# Patient Record
Sex: Male | Born: 1945 | Race: Black or African American | Hispanic: No | Marital: Married | Smoking: Former smoker
Health system: Southern US, Community
[De-identification: ages and names within clinical notes are randomized; demographics above are authoritative.]

## PROBLEM LIST (undated history)

## (undated) DIAGNOSIS — J9600 Acute respiratory failure, unspecified whether with hypoxia or hypercapnia: Secondary | ICD-10-CM

## (undated) DIAGNOSIS — I214 Non-ST elevation (NSTEMI) myocardial infarction: Secondary | ICD-10-CM

## (undated) DIAGNOSIS — I2510 Atherosclerotic heart disease of native coronary artery without angina pectoris: Secondary | ICD-10-CM

## (undated) DIAGNOSIS — E785 Hyperlipidemia, unspecified: Secondary | ICD-10-CM

## (undated) DIAGNOSIS — J810 Acute pulmonary edema: Secondary | ICD-10-CM

## (undated) DIAGNOSIS — R000 Tachycardia, unspecified: Secondary | ICD-10-CM

## (undated) DIAGNOSIS — I10 Essential (primary) hypertension: Secondary | ICD-10-CM

## (undated) HISTORY — DX: Hyperlipidemia, unspecified: E78.5

## (undated) HISTORY — DX: Atherosclerotic heart disease of native coronary artery without angina pectoris: I25.10

---

## 1999-05-01 ENCOUNTER — Inpatient Hospital Stay (HOSPITAL_COMMUNITY): Admission: EM | Admit: 1999-05-01 | Discharge: 1999-05-03 | Payer: Self-pay | Admitting: Emergency Medicine

## 2004-07-14 ENCOUNTER — Inpatient Hospital Stay (HOSPITAL_COMMUNITY): Admission: EM | Admit: 2004-07-14 | Discharge: 2004-07-16 | Payer: Self-pay | Admitting: Emergency Medicine

## 2005-11-27 ENCOUNTER — Ambulatory Visit: Payer: Self-pay | Admitting: Family Medicine

## 2011-11-21 ENCOUNTER — Encounter (HOSPITAL_COMMUNITY): Payer: Self-pay | Admitting: Cardiology

## 2011-11-21 ENCOUNTER — Inpatient Hospital Stay (HOSPITAL_COMMUNITY)
Admission: EM | Admit: 2011-11-21 | Discharge: 2011-12-11 | DRG: 233 | Disposition: A | Discharge status: Home / Self Care | Payer: Medicare Other | Attending: Thoracic Surgery (Cardiothoracic Vascular Surgery) | Admitting: Thoracic Surgery (Cardiothoracic Vascular Surgery)

## 2011-11-21 ENCOUNTER — Emergency Department (HOSPITAL_COMMUNITY): Payer: Medicare Other

## 2011-11-21 ENCOUNTER — Other Ambulatory Visit: Payer: Self-pay

## 2011-11-21 DIAGNOSIS — I2589 Other forms of chronic ischemic heart disease: Secondary | ICD-10-CM | POA: Diagnosis present

## 2011-11-21 DIAGNOSIS — I10 Essential (primary) hypertension: Secondary | ICD-10-CM | POA: Diagnosis present

## 2011-11-21 DIAGNOSIS — I5041 Acute combined systolic (congestive) and diastolic (congestive) heart failure: Secondary | ICD-10-CM | POA: Diagnosis present

## 2011-11-21 DIAGNOSIS — N19 Unspecified kidney failure: Secondary | ICD-10-CM

## 2011-11-21 DIAGNOSIS — N179 Acute kidney failure, unspecified: Secondary | ICD-10-CM | POA: Diagnosis present

## 2011-11-21 DIAGNOSIS — N184 Chronic kidney disease, stage 4 (severe): Secondary | ICD-10-CM | POA: Diagnosis present

## 2011-11-21 DIAGNOSIS — I959 Hypotension, unspecified: Secondary | ICD-10-CM | POA: Diagnosis present

## 2011-11-21 DIAGNOSIS — E875 Hyperkalemia: Secondary | ICD-10-CM | POA: Diagnosis present

## 2011-11-21 DIAGNOSIS — E872 Acidosis, unspecified: Secondary | ICD-10-CM | POA: Diagnosis present

## 2011-11-21 DIAGNOSIS — I509 Heart failure, unspecified: Secondary | ICD-10-CM | POA: Diagnosis present

## 2011-11-21 DIAGNOSIS — D696 Thrombocytopenia, unspecified: Secondary | ICD-10-CM | POA: Diagnosis not present

## 2011-11-21 DIAGNOSIS — I452 Bifascicular block: Secondary | ICD-10-CM | POA: Diagnosis present

## 2011-11-21 DIAGNOSIS — J810 Acute pulmonary edema: Secondary | ICD-10-CM

## 2011-11-21 DIAGNOSIS — R0603 Acute respiratory distress: Secondary | ICD-10-CM

## 2011-11-21 DIAGNOSIS — Z7982 Long term (current) use of aspirin: Secondary | ICD-10-CM

## 2011-11-21 DIAGNOSIS — I447 Left bundle-branch block, unspecified: Secondary | ICD-10-CM | POA: Diagnosis present

## 2011-11-21 DIAGNOSIS — I2510 Atherosclerotic heart disease of native coronary artery without angina pectoris: Secondary | ICD-10-CM | POA: Diagnosis present

## 2011-11-21 DIAGNOSIS — I129 Hypertensive chronic kidney disease with stage 1 through stage 4 chronic kidney disease, or unspecified chronic kidney disease: Secondary | ICD-10-CM | POA: Diagnosis present

## 2011-11-21 DIAGNOSIS — I059 Rheumatic mitral valve disease, unspecified: Secondary | ICD-10-CM | POA: Diagnosis present

## 2011-11-21 DIAGNOSIS — I214 Non-ST elevation (NSTEMI) myocardial infarction: Secondary | ICD-10-CM | POA: Diagnosis present

## 2011-11-21 DIAGNOSIS — J9600 Acute respiratory failure, unspecified whether with hypoxia or hypercapnia: Secondary | ICD-10-CM

## 2011-11-21 DIAGNOSIS — E785 Hyperlipidemia, unspecified: Secondary | ICD-10-CM | POA: Diagnosis present

## 2011-11-21 DIAGNOSIS — Z87891 Personal history of nicotine dependence: Secondary | ICD-10-CM

## 2011-11-21 DIAGNOSIS — R000 Tachycardia, unspecified: Secondary | ICD-10-CM

## 2011-11-21 DIAGNOSIS — D62 Acute posthemorrhagic anemia: Secondary | ICD-10-CM | POA: Diagnosis not present

## 2011-11-21 DIAGNOSIS — Z951 Presence of aortocoronary bypass graft: Secondary | ICD-10-CM

## 2011-11-21 HISTORY — DX: Non-ST elevation (NSTEMI) myocardial infarction: I21.4

## 2011-11-21 HISTORY — DX: Tachycardia, unspecified: R00.0

## 2011-11-21 HISTORY — DX: Acute pulmonary edema: J81.0

## 2011-11-21 HISTORY — DX: Essential (primary) hypertension: I10

## 2011-11-21 HISTORY — DX: Acute respiratory failure, unspecified whether with hypoxia or hypercapnia: J96.00

## 2011-11-21 LAB — URINE MICROSCOPIC-ADD ON

## 2011-11-21 LAB — CBC
Hemoglobin: 12.4 g/dL — ABNORMAL LOW (ref 13.0–17.0)
MCH: 27.4 pg (ref 26.0–34.0)
MCV: 84.5 fL (ref 78.0–100.0)
Platelets: 233 10*3/uL (ref 150–400)
WBC: 9.6 10*3/uL (ref 4.0–10.5)

## 2011-11-21 LAB — POCT I-STAT 3, ART BLOOD GAS (G3+)
Acid-base deficit: 12 mmol/L — ABNORMAL HIGH (ref 0.0–2.0)
pCO2 arterial: 31.1 mmHg — ABNORMAL LOW (ref 35.0–45.0)
pO2, Arterial: 75 mmHg — ABNORMAL LOW (ref 80.0–100.0)

## 2011-11-21 LAB — TROPONIN I: Troponin I: 11.27 ng/mL (ref <0.30)

## 2011-11-21 LAB — POCT I-STAT, CHEM 8
BUN: 47 mg/dL — ABNORMAL HIGH (ref 6–23)
Calcium, Ion: 1.12 mmol/L (ref 1.12–1.32)
Hemoglobin: 14.3 g/dL (ref 13.0–17.0)
TCO2: 18 mmol/L (ref 0–100)

## 2011-11-21 LAB — HEPARIN LEVEL (UNFRACTIONATED): Heparin Unfractionated: 0.53 IU/mL (ref 0.30–0.70)

## 2011-11-21 LAB — COMPREHENSIVE METABOLIC PANEL
Albumin: 3.1 g/dL — ABNORMAL LOW (ref 3.5–5.2)
CO2: 17 mEq/L — ABNORMAL LOW (ref 19–32)
Calcium: 9 mg/dL (ref 8.4–10.5)
GFR calc Af Amer: 31 mL/min — ABNORMAL LOW (ref >90)
Potassium: 4.3 mEq/L (ref 3.5–5.1)

## 2011-11-21 LAB — URINALYSIS, ROUTINE W REFLEX MICROSCOPIC
Bilirubin Urine: NEGATIVE
Ketones, ur: NEGATIVE mg/dL
Leukocytes, UA: NEGATIVE
Nitrite: NEGATIVE
Protein, ur: 100 mg/dL — AB
Specific Gravity, Urine: 1.017 (ref 1.005–1.030)
Urobilinogen, UA: 0.2 mg/dL (ref 0.0–1.0)
pH: 5 (ref 5.0–8.0)

## 2011-11-21 LAB — APTT: aPTT: 23 seconds — ABNORMAL LOW (ref 24–37)

## 2011-11-21 LAB — BASIC METABOLIC PANEL
BUN: 53 mg/dL — ABNORMAL HIGH (ref 6–23)
Calcium: 8.5 mg/dL (ref 8.4–10.5)
Chloride: 107 mEq/L (ref 96–112)
Creatinine, Ser: 2.92 mg/dL — ABNORMAL HIGH (ref 0.50–1.35)
GFR calc Af Amer: 24 mL/min — ABNORMAL LOW (ref >90)

## 2011-11-21 LAB — CARDIAC PANEL(CRET KIN+CKTOT+MB+TROPI)
CK, MB: 19.1 ng/mL (ref 0.3–4.0)
CK, MB: 499.5 ng/mL (ref 0.3–4.0)
Troponin I: >25 ng/mL (ref <0.30)

## 2011-11-21 LAB — INFLUENZA PANEL BY PCR (TYPE A & B)
H1N1 flu by pcr: NOT DETECTED
Influenza B By PCR: NEGATIVE

## 2011-11-21 LAB — HEPATIC FUNCTION PANEL
AST: 62 U/L — ABNORMAL HIGH (ref 0–37)
Albumin: 3 g/dL — ABNORMAL LOW (ref 3.5–5.2)
Alkaline Phosphatase: 78 U/L (ref 39–117)
Total Protein: 7.8 g/dL (ref 6.0–8.3)

## 2011-11-21 LAB — HEMOGLOBIN A1C
Hgb A1c MFr Bld: 5.8 % — ABNORMAL HIGH (ref <5.7)
Mean Plasma Glucose: 120 mg/dL — ABNORMAL HIGH (ref <117)

## 2011-11-21 LAB — TSH: TSH: 1.985 u[IU]/mL (ref 0.350–4.500)

## 2011-11-21 LAB — POCT I-STAT TROPONIN I: Troponin i, poc: 1.81 ng/mL (ref 0.00–0.08)

## 2011-11-21 LAB — PRO B NATRIURETIC PEPTIDE: Pro B Natriuretic peptide (BNP): 8421 pg/mL — ABNORMAL HIGH (ref 0–125)

## 2011-11-21 LAB — PROTIME-INR: INR: 1.06 (ref 0.00–1.49)

## 2011-11-21 LAB — LACTIC ACID, PLASMA: Lactic Acid, Venous: 4.8 mmol/L — ABNORMAL HIGH (ref 0.5–2.2)

## 2011-11-21 MED ORDER — ROCURONIUM BROMIDE 50 MG/5ML IV SOLN
INTRAVENOUS | Status: AC
  Filled 2011-11-21: qty 2

## 2011-11-21 MED ORDER — HYDRALAZINE HCL 25 MG PO TABS
12.5000 mg | ORAL_TABLET | ORAL | Status: AC
  Filled 2011-11-21: qty 0.5

## 2011-11-21 MED ORDER — IPRATROPIUM BROMIDE 0.02 % IN SOLN
0.5000 mg | Freq: Once | RESPIRATORY_TRACT | Status: AC
  Administered 2011-11-21: 0.5 mg via RESPIRATORY_TRACT

## 2011-11-21 MED ORDER — DEXTROSE 50 % IV SOLN
50.0000 mL | Freq: Once | INTRAVENOUS | Status: AC
  Administered 2011-11-21: 50 mL via INTRAVENOUS
  Filled 2011-11-21: qty 50

## 2011-11-21 MED ORDER — ACETAMINOPHEN 325 MG PO TABS: 650.0000 mg | ORAL_TABLET | ORAL | Status: DC | PRN

## 2011-11-21 MED ORDER — ETOMIDATE 2 MG/ML IV SOLN
INTRAVENOUS | Status: AC
  Filled 2011-11-21: qty 20

## 2011-11-21 MED ORDER — ASPIRIN EC 81 MG PO TBEC
81.0000 mg | DELAYED_RELEASE_TABLET | Freq: Every day | ORAL | Status: DC
  Administered 2011-11-21 – 2011-12-02 (×11): 81 mg via ORAL
  Filled 2011-11-21 (×13): qty 1

## 2011-11-21 MED ORDER — HEPARIN SOD (PORCINE) IN D5W 100 UNIT/ML IV SOLN
INTRAVENOUS | Status: AC
  Administered 2011-11-21: 12:00:00
  Filled 2011-11-21: qty 250

## 2011-11-21 MED ORDER — ALBUTEROL SULFATE (5 MG/ML) 0.5% IN NEBU
5.0000 mg | INHALATION_SOLUTION | Freq: Once | RESPIRATORY_TRACT | Status: AC
  Administered 2011-11-21: 5 mg via RESPIRATORY_TRACT
  Filled 2011-11-21: qty 1

## 2011-11-21 MED ORDER — SUCCINYLCHOLINE CHLORIDE 20 MG/ML IJ SOLN
INTRAMUSCULAR | Status: AC
  Filled 2011-11-21: qty 10

## 2011-11-21 MED ORDER — SODIUM CHLORIDE 0.9 % IV SOLN
1.0000 g | Freq: Once | INTRAVENOUS | Status: AC
  Administered 2011-11-21: 1 g via INTRAVENOUS
  Filled 2011-11-21: qty 10

## 2011-11-21 MED ORDER — ZOLPIDEM TARTRATE 5 MG PO TABS
5.0000 mg | ORAL_TABLET | Freq: Every evening | ORAL | Status: DC | PRN
  Administered 2011-12-02: 5 mg via ORAL
  Filled 2011-11-21: qty 1

## 2011-11-21 MED ORDER — LISINOPRIL-HYDROCHLOROTHIAZIDE 20-12.5 MG PO TABS: 1.0000 | ORAL_TABLET | Freq: Every day | ORAL | Status: DC

## 2011-11-21 MED ORDER — ALBUTEROL SULFATE (5 MG/ML) 0.5% IN NEBU
INHALATION_SOLUTION | RESPIRATORY_TRACT | Status: AC
  Filled 2011-11-21: qty 1

## 2011-11-21 MED ORDER — LIDOCAINE HCL (CARDIAC) 20 MG/ML IV SOLN
INTRAVENOUS | Status: AC
  Filled 2011-11-21: qty 5

## 2011-11-21 MED ORDER — IPRATROPIUM BROMIDE 0.02 % IN SOLN
RESPIRATORY_TRACT | Status: AC
  Administered 2011-11-21: 0.5 mg via RESPIRATORY_TRACT
  Filled 2011-11-21: qty 2.5

## 2011-11-21 MED ORDER — FUROSEMIDE 10 MG/ML IJ SOLN
INTRAMUSCULAR | Status: AC
  Filled 2011-11-21: qty 8

## 2011-11-21 MED ORDER — HEPARIN BOLUS VIA INFUSION
4000.0000 [IU] | Freq: Once | INTRAVENOUS | Status: AC
  Administered 2011-11-21: 4000 [IU] via INTRAVENOUS

## 2011-11-21 MED ORDER — SIMVASTATIN 5 MG PO TABS
5.0000 mg | ORAL_TABLET | Freq: Every day | ORAL | Status: DC
  Administered 2011-11-21 – 2011-11-24 (×4): 5 mg via ORAL
  Filled 2011-11-21 (×5): qty 1

## 2011-11-21 MED ORDER — IPRATROPIUM BROMIDE 0.02 % IN SOLN
RESPIRATORY_TRACT | Status: AC
  Filled 2011-11-21: qty 2.5

## 2011-11-21 MED ORDER — METHYLPREDNISOLONE SODIUM SUCC 125 MG IJ SOLR
125.0000 mg | Freq: Once | INTRAMUSCULAR | Status: AC
  Administered 2011-11-21: 125 mg via INTRAVENOUS
  Filled 2011-11-21: qty 2

## 2011-11-21 MED ORDER — METOPROLOL TARTRATE 1 MG/ML IV SOLN
5.0000 mg | Freq: Four times a day (QID) | INTRAVENOUS | Status: DC
  Administered 2011-11-21 – 2011-11-23 (×6): 5 mg via INTRAVENOUS
  Filled 2011-11-21 (×10): qty 5

## 2011-11-21 MED ORDER — FUROSEMIDE 10 MG/ML IJ SOLN
80.0000 mg | Freq: Once | INTRAMUSCULAR | Status: AC
  Administered 2011-11-21: 80 mg via INTRAVENOUS

## 2011-11-21 MED ORDER — INSULIN ASPART 100 UNIT/ML IV SOLN
10.0000 [IU] | Freq: Once | INTRAVENOUS | Status: AC
  Administered 2011-11-21: 10 [IU] via INTRAVENOUS
  Filled 2011-11-21: qty 0.1

## 2011-11-21 MED ORDER — HYDROCHLOROTHIAZIDE 12.5 MG PO CAPS
12.5000 mg | ORAL_CAPSULE | Freq: Every day | ORAL | Status: DC
  Administered 2011-11-21: 12.5 mg via ORAL
  Filled 2011-11-21: qty 1

## 2011-11-21 MED ORDER — ASPIRIN 81 MG PO TABS: 81.0000 mg | ORAL_TABLET | Freq: Every day | ORAL | Status: DC

## 2011-11-21 MED ORDER — MAGNESIUM SULFATE 50 % IJ SOLN: 2.0000 g | Freq: Once | INTRAMUSCULAR | Status: DC

## 2011-11-21 MED ORDER — NITROGLYCERIN 0.4 MG SL SUBL: 0.4000 mg | SUBLINGUAL_TABLET | SUBLINGUAL | Status: DC | PRN

## 2011-11-21 MED ORDER — METOPROLOL TARTRATE 1 MG/ML IV SOLN
INTRAVENOUS | Status: AC
  Filled 2011-11-21: qty 5

## 2011-11-21 MED ORDER — ALPRAZOLAM 0.25 MG PO TABS
0.2500 mg | ORAL_TABLET | Freq: Two times a day (BID) | ORAL | Status: DC | PRN
  Administered 2011-11-21: 0.25 mg via ORAL
  Filled 2011-11-21: qty 1

## 2011-11-21 MED ORDER — MAGNESIUM SULFATE 50 % IJ SOLN
2.0000 g | Freq: Once | INTRAMUSCULAR | Status: DC
  Filled 2011-11-21: qty 4

## 2011-11-21 MED ORDER — MAGNESIUM SULFATE IN D5W 10-5 MG/ML-% IV SOLN
1.0000 g | Freq: Once | INTRAVENOUS | Status: AC
  Administered 2011-11-21: 1 g via INTRAVENOUS
  Filled 2011-11-21: qty 100

## 2011-11-21 MED ORDER — ALBUTEROL SULFATE (5 MG/ML) 0.5% IN NEBU
5.0000 mg | INHALATION_SOLUTION | Freq: Once | RESPIRATORY_TRACT | Status: AC
  Administered 2011-11-21: 5 mg via RESPIRATORY_TRACT

## 2011-11-21 MED ORDER — HEPARIN (PORCINE) IN NACL 100-0.45 UNIT/ML-% IJ SOLN
950.0000 [IU]/h | INTRAMUSCULAR | Status: DC
  Administered 2011-11-21: 1000 [IU]/h via INTRAVENOUS
  Administered 2011-11-22: 850 [IU]/h via INTRAVENOUS
  Administered 2011-11-23: 950 [IU]/h via INTRAVENOUS
  Filled 2011-11-21 (×3): qty 250

## 2011-11-21 MED ORDER — AMLODIPINE BESYLATE 10 MG PO TABS
10.0000 mg | ORAL_TABLET | Freq: Every day | ORAL | Status: DC
  Filled 2011-11-21: qty 1

## 2011-11-21 MED ORDER — ONDANSETRON HCL 4 MG/2ML IJ SOLN: 4.0000 mg | Freq: Four times a day (QID) | INTRAMUSCULAR | Status: DC | PRN

## 2011-11-21 MED ORDER — LISINOPRIL 20 MG PO TABS
20.0000 mg | ORAL_TABLET | Freq: Every day | ORAL | Status: DC
  Administered 2011-11-21: 20 mg via ORAL
  Filled 2011-11-21: qty 1

## 2011-11-21 MED ORDER — NITROGLYCERIN IN D5W 200-5 MCG/ML-% IV SOLN
2.0000 ug/min | INTRAVENOUS | Status: DC
  Administered 2011-11-21 (×2): 5 ug/min via INTRAVENOUS
  Filled 2011-11-21: qty 250

## 2011-11-21 MED ORDER — ALBUTEROL SULFATE (5 MG/ML) 0.5% IN NEBU
INHALATION_SOLUTION | RESPIRATORY_TRACT | Status: AC
  Administered 2011-11-21: 5 mg via RESPIRATORY_TRACT
  Filled 2011-11-21: qty 1

## 2011-11-21 MED ORDER — NITROGLYCERIN 0.4 MG SL SUBL
SUBLINGUAL_TABLET | SUBLINGUAL | Status: AC
  Administered 2011-11-21: 0.4 mg
  Filled 2011-11-21: qty 25

## 2011-11-21 MED ORDER — CARVEDILOL 3.125 MG PO TABS
3.1250 mg | ORAL_TABLET | Freq: Once | ORAL | Status: AC
  Administered 2011-11-21: 3.125 mg via ORAL

## 2011-11-21 MED ORDER — VANCOMYCIN HCL IN DEXTROSE 1-5 GM/200ML-% IV SOLN
1000.0000 mg | Freq: Once | INTRAVENOUS | Status: AC
  Administered 2011-11-21: 1000 mg via INTRAVENOUS
  Filled 2011-11-21: qty 200

## 2011-11-21 MED ORDER — HYDRALAZINE HCL 20 MG/ML IJ SOLN
20.0000 mg | Freq: Three times a day (TID) | INTRAMUSCULAR | Status: DC
  Administered 2011-11-21 – 2011-11-23 (×3): 20 mg via INTRAVENOUS
  Filled 2011-11-21 (×7): qty 1

## 2011-11-21 MED ORDER — FUROSEMIDE 10 MG/ML IJ SOLN
60.0000 mg | Freq: Once | INTRAMUSCULAR | Status: AC
  Administered 2011-11-21: 60 mg via INTRAVENOUS
  Filled 2011-11-21: qty 2

## 2011-11-21 MED ORDER — MAGNESIUM SULFATE 50 % IJ SOLN
1.0000 g | Freq: Once | INTRAMUSCULAR | Status: DC
  Filled 2011-11-21: qty 2

## 2011-11-21 MED ORDER — PNEUMOCOCCAL VAC POLYVALENT 25 MCG/0.5ML IJ INJ
0.5000 mL | INJECTION | INTRAMUSCULAR | Status: AC
  Filled 2011-11-21: qty 0.5

## 2011-11-21 MED ORDER — SODIUM CHLORIDE 0.9 % IV SOLN
INTRAVENOUS | Status: DC
  Administered 2011-11-21: 17:00:00 via INTRAVENOUS
  Administered 2011-11-25: 50 mL via INTRAVENOUS
  Administered 2011-11-28 – 2011-11-30 (×2): via INTRAVENOUS

## 2011-11-21 MED ORDER — FUROSEMIDE 10 MG/ML IJ SOLN
80.0000 mg | Freq: Three times a day (TID) | INTRAMUSCULAR | Status: DC
  Administered 2011-11-21 – 2011-11-24 (×9): 80 mg via INTRAVENOUS
  Filled 2011-11-21 (×12): qty 8

## 2011-11-21 MED ORDER — IPRATROPIUM BROMIDE 0.02 % IN SOLN
0.5000 mg | RESPIRATORY_TRACT | Status: DC
  Administered 2011-11-21 – 2011-11-22 (×4): 0.5 mg via RESPIRATORY_TRACT
  Filled 2011-11-21 (×4): qty 2.5

## 2011-11-21 MED ORDER — HYDRALAZINE HCL 25 MG PO TABS
12.5000 mg | ORAL_TABLET | Freq: Three times a day (TID) | ORAL | Status: DC
  Filled 2011-11-21 (×3): qty 0.5

## 2011-11-21 MED ORDER — CARVEDILOL 3.125 MG PO TABS
3.1250 mg | ORAL_TABLET | Freq: Two times a day (BID) | ORAL | Status: DC
  Filled 2011-11-21 (×2): qty 1

## 2011-11-21 MED ORDER — ALBUTEROL SULFATE (5 MG/ML) 0.5% IN NEBU
INHALATION_SOLUTION | RESPIRATORY_TRACT | Status: AC
  Administered 2011-11-21: 5 mg
  Filled 2011-11-21: qty 1

## 2011-11-21 MED ORDER — PIPERACILLIN-TAZOBACTAM 3.375 G IVPB
3.3750 g | Freq: Once | INTRAVENOUS | Status: AC
  Administered 2011-11-21: 3.375 g via INTRAVENOUS
  Filled 2011-11-21: qty 50

## 2011-11-21 MED ORDER — LEVALBUTEROL HCL 0.63 MG/3ML IN NEBU
0.6300 mg | INHALATION_SOLUTION | RESPIRATORY_TRACT | Status: DC
  Administered 2011-11-21 – 2011-11-22 (×4): 0.63 mg via RESPIRATORY_TRACT
  Filled 2011-11-21 (×11): qty 3

## 2011-11-21 MED ORDER — MORPHINE SULFATE 2 MG/ML IJ SOLN
2.0000 mg | INTRAMUSCULAR | Status: DC | PRN
  Administered 2011-11-21 (×3): 2 mg via INTRAVENOUS
  Filled 2011-11-21 (×4): qty 1

## 2011-11-21 NOTE — Progress Notes (Signed)
RT called to adjust patient BiPAP mask. When I arrived, he was pulling it off, very confused. He did not want to wear mask, BBS very wheezy. We talked him in to putting it back on. 2 albuterol nebs given back to back. I changed BiPAP to NIV/PC with a BUR of 15 and he is more comfortable. RT will continue to monitor.

## 2011-11-21 NOTE — ED Notes (Signed)
Dr.Linker shown results of Trop. Istat  ED-Lab.

## 2011-11-21 NOTE — H&P (Signed)
Francisco [name] is an 65 y.o. male.   Chief Complaint: chest pain and acute SOB HPI: 65 year old African American male with long-standing history of hypertension presents to the emergency room today secondary to chest pain and acute shortness of breath. He had done fairly well until last evening he developed chest pain and then during the night became short of breath he had nausea and emesis, clammy to touch, extremely short of breath.  In the emergency room he is in respiratory failure and was placed on BiPAP and 60 mg of IV Lasix  was given as well as nebulizer treatments. While improved at the time of cardiology exam he is still obviously short of breath with rales throughout both lungs, there are currently no wheezes. His chest pain has also resolved.  He is on IV nitroglycerin which we have quickly titrated upwards to 30 mics currently and he is getting another 80 mg of IV Lasix now.  His BNP is greater than 8000, his troponin I. Marker is 1.81 his EKG has changed she appears to be in sinus tachycardia and now with a new left bundle branch block though our comparison is to EKG in 2005. Chest x-ray with acute pulmonary edema though they will not rule out infiltrates. Here in the emergency room he is also received IV magnesium as well as IV antibiotics.  Unfortunately the patient has been unable to obtain his outpatient blood pressure medications which may be precipitating cause of this admission.  He has had aches stream hypertension in the past which was complicated with stopping his medications before due to financial issues. I currently do not have outpatient records he was to have a stress test and an echo in her office in 2005.  Also here in the ER his creatinine is greater than 2 and 2005 creatinine was 1.4.  He will be admitted to the ICU.  Past Medical History  Diagnosis Date  .  requiring BiPap 11/21/2011  . Acute pulmonary edema 11/21/2011  . Tachycardia 11/21/2011  . HTN (hypertension)  11/21/2011    History reviewed. No pertinent past surgical history.  History reviewed. No pertinent family history. Social History:  does not have a smoking history on file. He does not have any smokeless tobacco history on file. His alcohol and drug histories not on file. Married  Allergies: No Known Allergies  Medications Prior to Admission  Medication Dose Route Frequency Provider Last Rate Last Dose  . albuterol (PROVENTIL) (5 MG/ML) 0.5% nebulizer solution 5 mg  5 mg Nebulization Once Threasa Beards, MD   5 mg at 11/21/11 0840  . albuterol (PROVENTIL) (5 MG/ML) 0.5% nebulizer solution 5 mg  5 mg Nebulization Once Threasa Beards, MD   5 mg at 11/21/11 0902  . furosemide (LASIX) injection 60 mg  60 mg Intravenous Once Threasa Beards, MD   60 mg at 11/21/11 [id number]  . furosemide (LASIX) injection 80 mg  80 mg Intravenous Once Threasa Beards, MD      . heparin ADULT infusion 100 units/mL (25000 units/250 mL)  1,000 Units/hr Intravenous Continuous Threasa Beards, MD      . heparin bolus via infusion 4,000 Units  4,000 Units Intravenous Once Threasa Beards, MD      . ipratropium (ATROVENT) nebulizer solution 0.5 mg  0.5 mg Nebulization Once Threasa Beards, MD   0.5 mg at 11/21/11 0840  . ipratropium (ATROVENT) nebulizer solution 0.5 mg  0.5 mg Nebulization Once Threasa Beards,  MD   0.5 mg at 11/21/11 0902  . magnesium sulfate IVPB 1 g 100 mL  1 g Intravenous Once Threasa Beards, MD   1 g at 11/21/11 1003  . methylPREDNISolone sodium succinate (SOLU-MEDROL) 125 MG injection 125 mg  125 mg Intravenous Once Threasa Beards, MD   125 mg at 11/21/11 0927  . morphine 2 MG/ML injection 2 mg  2 mg Intravenous Q2H PRN Isaiah Serge, NP   2 mg at 11/21/11 1128  . nitroGLYCERIN (NITROSTAT) 0.4 MG SL tablet        0.4 mg at 11/21/11 0929  . nitroGLYCERIN 0.2 mg/mL in dextrose 5 % infusion  2-200 mcg/min Intravenous Titrated Threasa Beards, MD 1.5 mL/hr at 11/21/11 1014 5 mcg/min at 11/21/11 1014    . piperacillin-tazobactam (ZOSYN) IVPB 3.375 g  3.375 g Intravenous Once Threasa Beards, MD   3.375 g at 11/21/11 1104  . vancomycin (VANCOCIN) IVPB 1000 mg/200 mL premix  1,000 mg Intravenous Once Threasa Beards, MD      . DISCONTD: magnesium sulfate (IV Push/IM) injection 1 g  1 g Intravenous Once Threasa Beards, MD      . DISCONTD: magnesium sulfate (IV Push/IM) injection 2 g  2 g Intravenous Once Threasa Beards, MD      . DISCONTD: magnesium sulfate (IV Push/IM) injection 2 g  2 g Intravenous Once Threasa Beards, MD       No current outpatient prescriptions on file as of 11/21/2011.  Outpatient Medications : 1. Amlodipine 10 mg one daily none times one month 2. Lisinopril HCTZ 20/12-1/2 1 daily 9 for the last one month 3. Atenolol 100 mg one daily none for the last month 4. Clonidine 0.2 mg one tablet twice daily none for the last month       Results for orders placed during the hospital encounter of 11/21/11 (from the past 48 hour(s))  POCT I-STAT 3, BLOOD GAS (G3+)     Status: Abnormal   Collection Time   11/21/11  8:53 AM      Component Value Range Comment   pH, Arterial 7.247 (*) 7.350 - 7.450     pCO2 arterial 31.1 (*) 35.0 - 45.0 (mmHg)    pO2, Arterial 75.0 (*) 80.0 - 100.0 (mmHg)    Bicarbonate 13.5 (*) 20.0 - 24.0 (mEq/L)    TCO2 14  0 - 100 (mmol/L)    O2 Saturation 93.0      Acid-base deficit 12.0 (*) 0.0 - 2.0 (mmol/L)    Collection site RADIAL, ALLEN'S TEST ACCEPTABLE      Drawn by Operator      Sample type ARTERIAL     CBC     Status: Abnormal   Collection Time   11/21/11  8:55 AM      Component Value Range Comment   WBC 9.6  4.0 - 10.5 (K/uL)    RBC 4.53  4.22 - 5.81 (MIL/uL)    Hemoglobin 12.4 (*) 13.0 - 17.0 (g/dL)    HCT 38.3 (*) 39.0 - 52.0 (%)    MCV 84.5  78.0 - 100.0 (fL)    MCH 27.4  26.0 - 34.0 (pg)    MCHC 32.4  30.0 - 36.0 (g/dL)    RDW 16.1 (*) 11.5 - 15.5 (%)    Platelets 233  150 - 400 (K/uL)   COMPREHENSIVE METABOLIC PANEL      Status: Abnormal   Collection Time   11/21/11  8:55  AM      Component Value Range Comment   Sodium 138  135 - 145 (mEq/L)    Potassium 4.3  3.5 - 5.1 (mEq/L)    Chloride 105  96 - 112 (mEq/L)    CO2 17 (*) 19 - 32 (mEq/L)    Glucose, Bld 253 (*) 70 - 99 (mg/dL)    BUN 40 (*) 6 - 23 (mg/dL)    Creatinine, Ser 2.41 (*) 0.50 - 1.35 (mg/dL)    Calcium 9.0  8.4 - 10.5 (mg/dL)    Total Protein 7.7  6.0 - 8.3 (g/dL)    Albumin 3.1 (*) 3.5 - 5.2 (g/dL)    AST 88 (*) 0 - 37 (U/L) HEMOLYSIS AT THIS LEVEL MAY AFFECT RESULT   ALT 42  0 - 53 (U/L)    Alkaline Phosphatase 79  39 - 117 (U/L)    Total Bilirubin 0.3  0.3 - 1.2 (mg/dL)    GFR calc non Af Amer 27 (*) >90 (mL/min)    GFR calc Af Amer 31 (*) >90 (mL/min)   PRO B NATRIURETIC PEPTIDE     Status: Abnormal   Collection Time   11/21/11  8:55 AM      Component Value Range Comment   Pro B Natriuretic peptide (BNP) 8421.0 (*) 0 - 125 (pg/mL)   APTT     Status: Abnormal   Collection Time   11/21/11  8:55 AM      Component Value Range Comment   aPTT 23 (*) 24 - 37 (seconds)   PROTIME-INR     Status: Normal   Collection Time   11/21/11  8:55 AM      Component Value Range Comment   Prothrombin Time 14.0  11.6 - 15.2 (seconds)    INR 1.06  0.00 - 1.49    TROPONIN I     Status: Abnormal   Collection Time   11/21/11  9:16 AM      Component Value Range Comment   Troponin I 11.27 (*) <0.30 (ng/mL)   LACTIC ACID, PLASMA     Status: Abnormal   Collection Time   11/21/11  9:22 AM      Component Value Range Comment   Lactic Acid, Venous 4.8 (*) 0.5 - 2.2 (mmol/L)   POCT I-STAT TROPONIN I     Status: Abnormal   Collection Time   11/21/11  9:28 AM      Component Value Range Comment   Troponin i, poc 1.81 (*) 0.00 - 0.08 (ng/mL)    Comment NOTIFIED PHYSICIAN      Comment 3            POCT I-STAT, CHEM 8     Status: Abnormal   Collection Time   11/21/11  9:30 AM      Component Value Range Comment   Sodium 143  135 - 145 (mEq/L)     Potassium 4.5  3.5 - 5.1 (mEq/L)    Chloride 119 (*) 96 - 112 (mEq/L)    BUN 47 (*) 6 - 23 (mg/dL)    Creatinine, Ser 2.20 (*) 0.50 - 1.35 (mg/dL)    Glucose, Bld 256 (*) 70 - 99 (mg/dL)    Calcium, Ion 1.12  1.12 - 1.32 (mmol/L)    TCO2 18  0 - 100 (mmol/L)    Hemoglobin 14.3  13.0 - 17.0 (g/dL)    HCT 42.0  39.0 - 52.0 (%)   URINALYSIS, ROUTINE W REFLEX MICROSCOPIC     Status:  Abnormal   Collection Time   11/21/11  9:34 AM      Component Value Range Comment   Color, Urine YELLOW  YELLOW     APPearance CLOUDY (*) CLEAR     Specific Gravity, Urine 1.017  1.005 - 1.030     pH 5.0  5.0 - 8.0     Glucose, UA 250 (*) NEGATIVE (mg/dL)    Hgb urine dipstick MODERATE (*) NEGATIVE     Bilirubin Urine NEGATIVE  NEGATIVE     Ketones, ur NEGATIVE  NEGATIVE (mg/dL)    Protein, ur 100 (*) NEGATIVE (mg/dL)    Urobilinogen, UA 0.2  0.0 - 1.0 (mg/dL)    Nitrite NEGATIVE  NEGATIVE     Leukocytes, UA NEGATIVE  NEGATIVE    URINE MICROSCOPIC-ADD ON     Status: Abnormal   Collection Time   11/21/11  9:34 AM      Component Value Range Comment   RBC / HPF 0-2  <3 (RBC/hpf)    Bacteria, UA RARE  RARE     Casts HYALINE CASTS (*) NEGATIVE     Urine-Other MUCOUS PRESENT      Dg Chest Portable 1 View  11/21/2011  *RADIOLOGY REPORT*  Clinical Data: Shortness of breath and chest pain  PORTABLE CHEST - 1 VIEW  Comparison: 07/14/2004  Findings: There is extensive symmetric patchy bilateral airspace disease.  Airspace disease partially obscures the cardiac silhouette.  Overall, heart size appears similar to prior two-view chest radiograph and appears grossly within normal limits for portable technique. Pulmonary vascularity appears mildly prominent. Both costophrenic angles are blunted, for small pleural effusions cannot be excluded.  No visible pneumothorax.  Degenerative changes of both shoulders in the thoracic spine are noted.  IMPRESSION: Extensive bilateral airspace disease.  Given the suspected increased  pulmonary vascularity, this is favored to be due to pulmonary edema.  Bilateral multilobar pneumonia is also a possibility, and clinical correlation is recommended.  Original Report Authenticated By: Curlene Dolphin, M.D.    ROS: Gen.: No colds or fevers no issues until yesterday. Skin: No rashes or ulcers. HEENT: No blurred vision. Cardiovascular: Chest pain that began yesterday but none prior to that time. Pulmonary: Shortness of breath began during the night  GI: No diarrhea constipation or melena GU: No hematuria or dysuria Musculoskeletal: Denies any complaints Neuro: No lightheadedness dizziness or syncope Endocrine: No diabetes or thyroid disease that he is aware of  Difficult to obtain information from patient due to shortness of breath, and his wife gave most of this information.  Blood pressure 177/127, pulse 132, temperature 97.2 F (36.2 C), temperature source Core (Comment), resp. rate 28, SpO2 96.00%. PE: Gen.: Alert, oriented, obviously short of breath, anxious, restless. Skin: Cool and dry brisk capillary refill HEENT: Normocephalic sclera clear BiPAP mask is on Neck: Supple with JVD to jaw line no carotid bruits  Heart: S1-S2 rapid and regular muffled heart sounds secondary to lung sounds. Lungs: Rales throughout lung fields at this time there are no wheezes, but on admission he did have significant wheezing. Abdomen: Soft nontender positive bowel sounds cannot palpate liver spleen or masses. Extremities: No lower extremity edema pedal pulses 2+ Neuro: Alert and oriented x3 follows commands moves all extremities.   Assessment/Plan Patient Active Problem List  Diagnoses  . Acute respiratory failure requiring BiPap  . Acute pulmonary edema  . Tachycardia  . HTN (hypertension)  abnormal troponin Resp. Acidosis, hypoxic on arrival  Plan: Dr. Claiborne Billings has seen and examined  the pt as well.  Admit to cardiac ICU, IV heparin, IV NTG, diuresis, continue BiPap,  Serial cardiac  enzymes.  2d echo. Further management depending on above results.   INGOLD,LAURA R 11/21/2011, 11:28 AM      Patient seen and examined. Agree with assessment and plan.  65 yo AA male with long standing history of HTN. I had seen him remotely but not in several years.  Apparently he has run out of many of his medications.  He presents with increasing dyspnea, and had experienced some chest discomfort  During the night. He presents with florid pulmonary edema, now breathing better with Bi-PAP. In ER, I have given pt additional 80 mg IV lasix, 2 MSO4, and further titrated IV NTG>  Will start very low dose carvedilol initially.  Cr increased; will therefore, avoid ACE-I or ARB presently, but add hydralazine. May ultimately need aldosterone blockade but will not initiate presently.  Will obtain 2d-echo today to assess LV systoic and diastoic fxn, check serial cardiac enzymes. LBBB is new from last ECG of 2005.   Troy Sine, MD, [facility] 11/21/2011 12:30 PM

## 2011-11-21 NOTE — Progress Notes (Signed)
ANTICOAGULATION CONSULT NOTE - Follow Up  Pharmacy Consult for Heparin Indication: ACS  No Known Allergies   Assessment: 3 yoAAM with PMHx pertinent for HTN presents with CP, acute SOB.  Pt unable to obtain HTN meds d/t financial problems for last month.    Anticoagulation:  Heparin level reported at 0.53 on 1000 units/hr, no bleeding noted      Goal of Therapy:  Heparin 0.3-0.7   Plan:  1.  Continue heparin at 1000 units/hr, check heparin level with am labs   Patient Measurements: Height: 5\' 5"  (165.1 cm) Weight: 173 lb 4.5 oz (78.6 kg) IBW/kg (Calculated) : 61.5  Adjusted Body Weight:   Vital Signs: Temp: 99.1 F (37.3 C) (12/29 1800) Temp src: Core (Comment) (12/29 0959) BP: 95/76 mmHg (12/29 1800) Pulse Rate: 79  (12/29 1800)  Labs:  Basename 11/21/11 1845 11/21/11 1131 11/21/11 0930 11/21/11 0916 11/21/11 0855  HGB -- -- 14.3 -- 12.4*  HCT -- -- 42.0 -- 38.3*  PLT -- -- -- -- 233  APTT -- -- -- -- 23*  LABPROT -- -- -- -- 14.0  INR -- -- -- -- 1.06  HEPARINUNFRC 0.53 -- -- -- --  CREATININE -- -- 2.20* -- 2.41*  CKTOTAL -- 298* -- -- --  CKMB -- 19.1* -- -- --  TROPONINI -- 2.15* -- 11.27* --   Estimated Creatinine Clearance: 32.3 ml/min (by C-G formula based on Cr of 2.2).  Medical History: Past Medical History  Diagnosis Date  .  requiring BiPap 11/21/2011  . Acute pulmonary edema 11/21/2011  . Tachycardia 11/21/2011  . HTN (hypertension) 11/21/2011  . NSTEMI (non-ST elevated myocardial infarction) 11/21/2011   Marleah Beever Rickey Primus 11/21/2011,7:41 PM

## 2011-11-21 NOTE — ED Notes (Signed)
Pt pulled back in bed, calmer. Albuterol tx ordered. Pt wheezing.

## 2011-11-21 NOTE — ED Notes (Signed)
Patient reported to not feel well on last night.  He became worse this morning.  ems found patient with sats in 67 percent on room air.  Patient refused cpap enroute.  Patient with nonrebreather on upon arrival to ED.  Patient heartrate was 150 to 170 enroute.  Patient was also reported to be hypertensive.  Patient also reported to have chest pain.  Patient only reported hx is hypertension

## 2011-11-21 NOTE — ED Notes (Signed)
Family at bedside. 

## 2011-11-21 NOTE — Consult Note (Signed)
Name: Francisco [name] MRN: [id number] DOB: 08-Jan-1946    LOS: 0  PCCM ADMISSION NOTE  History of Present Illness: 65 year old African American male with long-standing history of hypertension presents to the emergency room today secondary to chest pain and acute shortness of breath. He had done fairly well until last evening he developed chest pain and then during the night became short of breath he had nausea and emesis, clammy to touch, extremely short of breath. In the emergency room he is in respiratory failure and was placed on BiPAP and 60 mg of IV Lasix was given as well as nebulizer treatments with significant interval improvement in symptoms.  Patient has not been taking his HTN medications due to financial reasons.  Cardiology admitted the patient and PCCM will consult regarding BiPAP and respiratory management.  Lines / Drains: PIV BiPAP 12/29>>>  Cultures: None  Antibiotics: None   Past Medical History  Diagnosis Date  .  requiring BiPap 11/21/2011  . Acute pulmonary edema 11/21/2011  . Tachycardia 11/21/2011  . HTN (hypertension) 11/21/2011   History reviewed. No pertinent past surgical history. Prior to Admission medications   Medication Sig Start Date End Date Taking? Authorizing Provider  amLODipine (NORVASC) 10 MG tablet Take 10 mg by mouth daily.     Yes Historical Provider, MD  aspirin 81 MG tablet Take 81 mg by mouth daily.     Yes Historical Provider, MD  atenolol (TENORMIN) 100 MG tablet Take 100 mg by mouth daily.     Yes Historical Provider, MD  cloNIDine (CATAPRES) 0.2 MG tablet Take 0.2 mg by mouth 2 (two) times daily.     Yes Historical Provider, MD  lisinopril-hydrochlorothiazide (PRINZIDE,ZESTORETIC) 20-12.5 MG per tablet Take 1 tablet by mouth daily.     Yes Historical Provider, MD  pravastatin (PRAVACHOL) 20 MG tablet Take 20 mg by mouth at bedtime.     Yes Historical Provider, MD    Allergies No Known Allergies  Family History History reviewed. No  pertinent family history.  Social History  does not have a smoking history on file. He does not have any smokeless tobacco history on file. His alcohol and drug histories not on file.  Review Of Systems  11 points review of systems is negative with an exception of listed in HPI.  Vital Signs: Filed Vitals:   11/21/11 1109  BP: 177/127  Pulse: 132  Temp:   Resp: 28    Intake/Output Summary (Last 24 hours) at 11/21/11 1159 Last data filed at 11/21/11 1027  Gross per 24 hour  Intake    275 ml  Output      0 ml  Net    275 ml      . heparin 1,000 Units/hr (11/21/11 1144)  . nitroGLYCERIN 5 mcg/min (11/21/11 1014)   Ventilator settings: BiPAP 5/5 with 100% FiO2.  Physical Examination: General:  Chronically ill appearing, moderate respiratory distress. Neuro:  Alert and oriented, moving all ext.   HEENT:  [place]/AT, PERRL, EOM-I and dry mucous membranes. Neck:  Supple, -LAN and -thyromegally.   Cardiovascular:  IRIR, Nl S1/S2, -M/R/G. Lungs:  Diffuse crackles. Abdomen:  Soft, NT, ND and +BS. Musculoskeletal:  -edema and -tenderness.  Labs and Imaging:   CBC    Component Value Date/Time   WBC 9.6 11/21/2011 0855   RBC 4.53 11/21/2011 0855   HGB 14.3 11/21/2011 0930   HCT 42.0 11/21/2011 0930   PLT 233 11/21/2011 0855   MCV 84.5 11/21/2011 0855   MCH  27.4 11/21/2011 0855   MCHC 32.4 11/21/2011 0855   RDW 16.1* 11/21/2011 0855   BMET    Component Value Date/Time   NA 143 11/21/2011 0930   K 4.5 11/21/2011 0930   CL 119* 11/21/2011 0930   CO2 17* 11/21/2011 0855   GLUCOSE 256* 11/21/2011 0930   BUN 47* 11/21/2011 0930   CREATININE 2.20* 11/21/2011 0930   CALCIUM 9.0 11/21/2011 0855   GFRNONAA 27* 11/21/2011 0855   GFRAA 31* 11/21/2011 0855   ABG    Component Value Date/Time   PHART 7.247* 11/21/2011 0853   PCO2ART 31.1* 11/21/2011 0853   PO2ART 75.0* 11/21/2011 0853   HCO3 13.5* 11/21/2011 0853   TCO2 18 11/21/2011 0930   ACIDBASEDEF 12.0* 11/21/2011  0853   O2SAT 93.0 11/21/2011 0853    No results found for this basename: MG in the last 168 hours Lab Results  Component Value Date   CALCIUM 9.0 11/21/2011   CXR: Diffuse edema.  Assessment and Plan: 72 M with history of HTN who has not been taking his HTN medication for sometime now due to financial reasons who decompensated the night prior to presentation from a respiratory standpoint.  He called EMS and was found to be in respiratory failure with pulmonary edema and severe HTN.  Respiratory failure due to pulmonary edema with no evidence of infection from a history or physical standpoint.  Trop 11, NSTEMI with associated pulmonary edema.  Cardiology to admit, PCCM will consult for respiratory failure.  Resp: Respiratory failure due pulmonary edema from NSTEMI.  BiPAP is contraindicated in patients with an active MI, however the patient does appear very comfortable and I suspect will come off the vent in the next hour.  Therefore, will avoid intubation if able.  If does not come off BiPAP in the next hour will intubate. Plan: BiPAP and titrate down, if not off in the next hour will intubate.  KVO IVF.  Lasix.  Admit to ICU  Titrate O2 down for sat of 92-95%  No infection or COPD history so no need for abx, cultures or steroids.  NSTEMI: with elevated troponins.  Cards admitting. Plan: NTG  Beta blockers  Lasix  KVO IVF  Strict I/O  Further management per cards.  HTN: NTG, beta blockers and diureses, if remains elevated will change lopressor to labetalol but no ACE.  Renal: Baseline Cr of 1.4 but now 2.2 and in hyperchloremic metabolic acidosis, will KVO IVF and hope that lasix will address.  If not then will need bicarb drip as the acidosis is another component contributing to respiratory failure.  Will check F/U ABG.  The patient is critically ill with multiple organ systems failure and requires high complexity decision making for assessment and support, frequent evaluation and  titration of therapies, application of advanced monitoring technologies and extensive interpretation of multiple databases. Critical Care Time devoted to patient care services described in this note is 60 minutes.  Jennet Maduro, M.D. Pulmonary and [place]

## 2011-11-21 NOTE — Progress Notes (Signed)
[place] Progress Note Patient Name: Francisco [name] DOB: 08/03/1946 MRN: [id number]  Date of Service  11/21/2011   HPI/Events of Note   K 6.4 Trops trending up  eICU Interventions  Treat hyperkalemia with D50/ insulin, ca gluconate, Chk EKG    Intervention Category Intermediate Interventions: Electrolyte abnormality - evaluation and management  Jadin Kagel V. 11/21/2011, 8:14 PM

## 2011-11-21 NOTE — Progress Notes (Signed)
[place] Progress Note Patient Name: Francisco [name] DOB: Mar 23, 1946 MRN: [id number]  Date of Service  11/21/2011   HPI/Events of Note  Desatn when taken off bipap for meds   eICU Interventions  Dc PO meds, change to IV hydrallazine, IV lopressor, Increase EPAP to 8 >> improved satn, WOB acceptable, Tps trending down, excellent Tv   Intervention Category Intermediate Interventions: Respiratory distress - evaluation and management  Haroon Shatto V. 11/21/2011, 4:19 PM

## 2011-11-21 NOTE — ED Notes (Signed)
MD at bedside. Dr. Claiborne Billings at bedside

## 2011-11-21 NOTE — Progress Notes (Signed)
Called to unit regarding increasing cardiac enzymes. Troponin now >25. Pt denies chest pain. Remains on Bi pap but reports improvement. EKG reveals SR with LVH, actually improved from previous EKG. Will continue to follow closely. Restart Nitroglycerine IV and keep systolic BP > [id number].

## 2011-11-21 NOTE — ED Notes (Signed)
Verified drip of ntg with morgan brown rn and Miu Chiong rn

## 2011-11-21 NOTE — ED Notes (Signed)
EDP Linker at bedside.

## 2011-11-21 NOTE — Progress Notes (Signed)
ANTICOAGULATION CONSULT NOTE - Initial Consult  Pharmacy Consult for Heparin Indication: ACS  No Known Allergies  Patient Measurements:   Adjusted Body Weight:   Vital Signs: Temp: 97.2 F (36.2 C) (12/29 0959) Temp src: Core (Comment) (12/29 0959) BP: 177/127 mmHg (12/29 1109) Pulse Rate: 132  (12/29 1109)  Labs:  Basename 11/21/11 0930 11/21/11 0916 11/21/11 0855  HGB 14.3 -- 12.4*  HCT 42.0 -- 38.3*  PLT -- -- 233  APTT -- -- 23*  LABPROT -- -- 14.0  INR -- -- 1.06  HEPARINUNFRC -- -- --  CREATININE 2.20* -- 2.41*  CKTOTAL -- -- --  CKMB -- -- --  TROPONINI -- 11.27* --   CrCl is unknown because there is no height on file for the current visit.  Medical History: Past Medical History  Diagnosis Date  .  requiring BiPap 11/21/2011  . Acute pulmonary edema 11/21/2011  . Tachycardia 11/21/2011  . HTN (hypertension) 11/21/2011    Assessment: 78 yoAAM with PMHx pertinent for HTN presents with CP, acute SOB.  Pt unable to obtain HTN meds d/t financial problems for last month.    Anticoagulation:  CP with elevated troponin POC 1.8.  Pt has received heparin bolus 4000 units per MD and has heparin infusing at 1000 units/hr as well as nitro gtt 28mcg/min.  Lactic acid 4.8, Hgb 12.4-->14.3.  Baseline INR 1.06.  No known height or weight documented yet.  Nurse estimates pt height ~5'9" and weight <200lb.    Infectious Disease: Pt has received one dose vanc/zosyn in ED for ?PNA,  CXR from ED notes acute pulmonary edema though not ruling out infiltrates. Blood and urine cultures pending.  U/A: moderate glucose, protein.   SCr 2.41-->2.2.  WBC 9.6.  Afebrile.  No orders for pharmacy to dose antibiotics at this point.  NP states she is doubtful of continuing abx at this point.     Goal of Therapy:  Heparin 0.3-0.7   Plan:  1.  Continue heparin at 1000 units/hr.   Nurse unsure of correct weight, but if 180-200lb, then this rate is within target initial rate.  Check 6 hour HL.   Find out correct weight from family.  Daily HL/CBC.      Arlee Bossard E 11/21/2011,11:52 AM

## 2011-11-21 NOTE — ED Notes (Signed)
Pt confused and pulling off bipap. Edp/rt at bedside

## 2011-11-21 NOTE — Progress Notes (Signed)
Patient ID: Francisco [name], male   DOB: 1946/08/28, 65 y.o.   MRN: [id number]  Called to evaluate patient due to worsening dyspnea, increasing troponins.  Patient on BiPAP, with normal work of breathing, no significant desaturation on BiPAP.  Cards to see.

## 2011-11-21 NOTE — Progress Notes (Signed)
Patient transferred to 2900 on bipap.  bipap hooked up and patient is stable on bipap.  RT will continue to monitor.

## 2011-11-21 NOTE — ED Notes (Signed)
Patient placed on bipap upon arrival to room 4,  Patient tolerating well.  Pulse ox has improved to 92 percent on 100 percent oxygen.  Patient states nods that he is feeling better. ermd remains at bedside.  ekg completed with noted sinus tach and pvc and lbbb

## 2011-11-21 NOTE — Progress Notes (Signed)
Pt. Is on bipap NIV PC. 13/8 . Pt. fio2 titrated to 80%. Pt. Is tolerating well at this time.

## 2011-11-21 NOTE — ED Notes (Signed)
Nitro IV drip increased to 20 mcg/min per VO Arlee Muslim NP w/cardiologist

## 2011-11-21 NOTE — ED Notes (Signed)
MD at bedside. Francisco [name] informed of elevated CK MB 19.1 and Troponin 2.15

## 2011-11-21 NOTE — ED Notes (Signed)
Cecilie Kicks NP w/cardiologist  at bedside.

## 2011-11-21 NOTE — ED Notes (Signed)
Family at bedside. Family explained plan of care

## 2011-11-21 NOTE — ED Provider Notes (Signed)
History     CSN: [id number]  Arrival date & time 11/21/11  [id number]   First MD Initiated Contact with Patient 11/21/11 [phone]      Chief Complaint  Patient presents with  . Shortness of Breath  . Chest Pain    (Consider location/radiation/quality/duration/timing/severity/associated sxs/prior treatment) HPI A LEVEL 5 CAVEAT PERTAINS DUE TO RESPIRATORY DISTRESS, URGENT NEED FOR INTERVENTION Patient presenting in respiratory distress by EMS. Patient is able to give minimal history to 2 respiratory distress. He states he started feeling sick yesterday and has been worsening in terms of his shortness of breath. He also states he had some chest discomfort yesterday. He endorses a history of hypertension. Per EMS his O2 sats were in the 70s they were not able to obtain IV access and he did not tolerate CPAP. He arrived on a nonrebreather facemask oxygen and respiratory distress.  Past Medical History  Diagnosis Date  .  requiring BiPap 11/21/2011  . Acute pulmonary edema 11/21/2011  . Tachycardia 11/21/2011  . HTN (hypertension) 11/21/2011    History reviewed. No pertinent past surgical history.  History reviewed. No pertinent family history.  History  Substance Use Topics  . Smoking status: Never Smoker   . Smokeless tobacco: Not on file  . Alcohol Use: No      Review of Systems UNABLE TO OBTAIN ROS DUE TO LEVEL 5 CAVEAT  Allergies  Review of patient's allergies indicates no known allergies.  Home Medications  No current outpatient prescriptions on file.  BP 126/106  Pulse 107  Temp(Src) 99 F (37.2 C) (Core (Comment))  Resp 25  Ht 5\' 5"  (1.651 m)  Wt 173 lb 4.5 oz (78.6 kg)  BMI 28.84 kg/m2  SpO2 97% Vitals reviewed Physical Exam Physical Examination: General appearance - alert, anxious appearing, and in moderate distress Mental status - alert, answering questions in one word answers, nodding head in answer to questions Mouth - mucous membranes moist, pharynx  normal without lesions Chest - BSS, diffuse inspiratory and expiratory wheezing, inspiratory crackles and course rhonchi, + accessory muscle use, dyspneic Heart - tachycardic rate, regular rhythm, normal S1, S2, no murmurs, rubs, clicks or gallops Abdomen - soft, nontender, nondistended, no masses or organomegaly Neurological - alert, oriented, no focal findings, moving all extremities well Musculoskeletal - no joint tenderness, deformity or swelling Extremities - peripheral pulses normal, no pedal edema, no clubbing or cyanosis Skin - normal coloration and turgor, no rashes, diaphoretic  ED Course  Procedures (including critical care time)   Date: 11/21/2011  Rate: 124  Rhythm: sinus tachycardia  QRS Axis: left  Intervals: lbbb  ST/T Wave abnormalities: indeterminate  Conduction Disutrbances:left bundle branch block  Narrative Interpretation:   Old EKG Reviewed: changes noted LBBB is new since comparison of 07/15/04    CRITICAL CARE Performed by: Threasa Beards   Total critical care time: 75  Critical care time was exclusive of separately billable procedures and treating other patients.  Critical care was necessary to treat or prevent imminent or life-threatening deterioration.  Critical care was time spent personally by me on the following activities: development of treatment plan with patient and/or surrogate as well as nursing, discussions with consultants, evaluation of patient's response to treatment, examination of patient, obtaining history from patient or surrogate, ordering and performing treatments and interventions, ordering and review of laboratory studies, ordering and review of radiographic studies, pulse oximetry and re-evaluation of patient's condition.  9:30 AM consulted Dr. Claiborne Billings by phone- he states cardiology will be down  to see the patient in the ED  10:10 AM- d/w Critical Care, they will see patient in the ED for consultation  11:29 AM dr. Claiborne Billings has seen  patient, nitro drip increased, morphine given.  Pt continues to appear improved from respiratory standpoint on bipap.  Heparin ordered.    Pt to be admitted to CCU by cardiology.    Labs Reviewed  CBC - Abnormal; Notable for the following:    Hemoglobin 12.4 (*)    HCT 38.3 (*)    RDW 16.1 (*)    All other components within normal limits  COMPREHENSIVE METABOLIC PANEL - Abnormal; Notable for the following:    CO2 17 (*)    Glucose, Bld 253 (*)    BUN 40 (*)    Creatinine, Ser 2.41 (*)    Albumin 3.1 (*)    AST 88 (*) HEMOLYSIS AT THIS LEVEL MAY AFFECT RESULT   GFR calc non Af Amer 27 (*)    GFR calc Af Amer 31 (*)    All other components within normal limits  PRO B NATRIURETIC PEPTIDE - Abnormal; Notable for the following:    Pro B Natriuretic peptide (BNP) 8421.0 (*)    All other components within normal limits  TROPONIN I - Abnormal; Notable for the following:    Troponin I 11.27 (*)    All other components within normal limits  APTT - Abnormal; Notable for the following:    aPTT 23 (*)    All other components within normal limits  POCT I-STAT 3, BLOOD GAS (G3+) - Abnormal; Notable for the following:    pH, Arterial 7.247 (*)    pCO2 arterial 31.1 (*)    pO2, Arterial 75.0 (*)    Bicarbonate 13.5 (*)    Acid-base deficit 12.0 (*)    All other components within normal limits  URINALYSIS, ROUTINE W REFLEX MICROSCOPIC - Abnormal; Notable for the following:    APPearance CLOUDY (*)    Glucose, UA 250 (*)    Hgb urine dipstick MODERATE (*)    Protein, ur 100 (*)    All other components within normal limits  LACTIC ACID, PLASMA - Abnormal; Notable for the following:    Lactic Acid, Venous 4.8 (*)    All other components within normal limits  POCT I-STAT, CHEM 8 - Abnormal; Notable for the following:    Chloride 119 (*)    BUN 47 (*)    Creatinine, Ser 2.20 (*)    Glucose, Bld 256 (*)    All other components within normal limits  POCT I-STAT TROPONIN I - Abnormal; Notable  for the following:    Troponin i, poc 1.81 (*)    All other components within normal limits  URINE MICROSCOPIC-ADD ON - Abnormal; Notable for the following:    Casts HYALINE CASTS (*)    All other components within normal limits  CARDIAC PANEL(CRET KIN+CKTOT+MB+TROPI) - Abnormal; Notable for the following:    Total CK 298 (*)    CK, MB 19.1 (*)    Troponin I 2.15 (*)    Relative Index 6.4 (*)    All other components within normal limits  HEPATIC FUNCTION PANEL - Abnormal; Notable for the following:    Albumin 3.0 (*)    AST 62 (*)    All other components within normal limits  URINALYSIS, ROUTINE W REFLEX MICROSCOPIC - Abnormal; Notable for the following:    Color, Urine RED (*) BIOCHEMICALS MAY BE AFFECTED BY COLOR   APPearance  TURBID (*)    Hgb urine dipstick LARGE (*)    Protein, ur 100 (*)    Leukocytes, UA SMALL (*)    All other components within normal limits  URINE MICROSCOPIC-ADD ON - Abnormal; Notable for the following:    Bacteria, UA MANY (*)    All other components within normal limits  PROTIME-INR  MAGNESIUM  CULTURE, BLOOD (ROUTINE X 2)  CULTURE, BLOOD (ROUTINE X 2)  URINE CULTURE  INFLUENZA PANEL BY PCR  CARDIAC PANEL(CRET KIN+CKTOT+MB+TROPI)  TSH  HEMOGLOBIN A1C  HEPARIN LEVEL (UNFRACTIONATED)  BASIC METABOLIC PANEL  MRSA PCR SCREENING  CARDIAC PANEL(CRET KIN+CKTOT+MB+TROPI)   Dg Chest Portable 1 View  11/21/2011  *RADIOLOGY REPORT*  Clinical Data: Shortness of breath and chest pain  PORTABLE CHEST - 1 VIEW  Comparison: 07/14/2004  Findings: There is extensive symmetric patchy bilateral airspace disease.  Airspace disease partially obscures the cardiac silhouette.  Overall, heart size appears similar to prior two-view chest radiograph and appears grossly within normal limits for portable technique. Pulmonary vascularity appears mildly prominent. Both costophrenic angles are blunted, for small pleural effusions cannot be excluded.  No visible pneumothorax.   Degenerative changes of both shoulders in the thoracic spine are noted.  IMPRESSION: Extensive bilateral airspace disease.  Given the suspected increased pulmonary vascularity, this is favored to be due to pulmonary edema.  Bilateral multilobar pneumonia is also a possibility, and clinical correlation is recommended.  Original Report Authenticated By: Curlene Dolphin, M.D.     1. CHF (congestive heart failure)   2. Respiratory distress   3. Hypertension   4. Renal failure       MDM  Patient presenting in significant respiratory distress the EMS. He arrived on a nonrebreather facemask with dyspnea tachycardia bilateral wheezing crackles with decreased air movement. He appeared anxious. He is also hypertensive. He was seen immediately upon arrival to the ED was placed on a monitor IV access obtained. Respiratory was called to initiate BiPAP. He was given sublingual nitroglycerin for his blood pressure and potential for pulmonary edema. He was also given albuterol and Atrovent. He was also treated with Solu-Medrol magnesium. Stat portable chest x-ray was obtained which showed bilateral infiltrates versus edema. He was given IV Lasix as well as started on broad-spectrum antibiotics. Cultures were obtained. During his ED stay he appeared more comfortable on BiPAP and his respiratory rate decreased to high 20s and low 30s. His blood pressure decreased around systolic of [id number]. Patient was also started on a nitroglycerin drip and given morphine. Patient intermittently became anxious and was uncomfortable with the BiPAP mask but this was alleviated with reassurance. I discussed his presentation and results with Dr. Claiborne Billings of cardiology as well as pulmonary critical care. Dr. Claiborne Billings and his team came to the ED to evaluate the patient and wrote admission orders. Critical care also evaluated the patient and agreed with care up until that point. Patient was to be admitted to the CCU and will be weaned from BiPAP as  able.        Threasa Beards, MD 11/21/11 [phone]

## 2011-11-21 NOTE — ED Notes (Signed)
MD at bedside. Dr. Ellin Goodie and Dr. Marni Griffon at bedside informed of Troponin 11.27

## 2011-11-22 ENCOUNTER — Inpatient Hospital Stay (HOSPITAL_COMMUNITY): Payer: Medicare Other

## 2011-11-22 ENCOUNTER — Other Ambulatory Visit: Payer: Self-pay

## 2011-11-22 DIAGNOSIS — I447 Left bundle-branch block, unspecified: Secondary | ICD-10-CM | POA: Diagnosis present

## 2011-11-22 DIAGNOSIS — J9600 Acute respiratory failure, unspecified whether with hypoxia or hypercapnia: Secondary | ICD-10-CM

## 2011-11-22 DIAGNOSIS — I214 Non-ST elevation (NSTEMI) myocardial infarction: Secondary | ICD-10-CM

## 2011-11-22 DIAGNOSIS — N179 Acute kidney failure, unspecified: Secondary | ICD-10-CM | POA: Diagnosis present

## 2011-11-22 DIAGNOSIS — I10 Essential (primary) hypertension: Secondary | ICD-10-CM

## 2011-11-22 DIAGNOSIS — J810 Acute pulmonary edema: Secondary | ICD-10-CM

## 2011-11-22 LAB — BASIC METABOLIC PANEL
BUN: 58 mg/dL — ABNORMAL HIGH (ref 6–23)
BUN: 61 mg/dL — ABNORMAL HIGH (ref 6–23)
CO2: 16 mEq/L — ABNORMAL LOW (ref 19–32)
Calcium: 9 mg/dL (ref 8.4–10.5)
Calcium: 9 mg/dL (ref 8.4–10.5)
Creatinine, Ser: 3.06 mg/dL — ABNORMAL HIGH (ref 0.50–1.35)
GFR calc Af Amer: 21 mL/min — ABNORMAL LOW (ref >90)
GFR calc non Af Amer: 20 mL/min — ABNORMAL LOW (ref >90)
GFR calc non Af Amer: 19 mL/min — ABNORMAL LOW (ref >90)
Glucose, Bld: 132 mg/dL — ABNORMAL HIGH (ref 70–99)
Glucose, Bld: 139 mg/dL — ABNORMAL HIGH (ref 70–99)

## 2011-11-22 LAB — LIPID PANEL
LDL Cholesterol: 180 mg/dL — ABNORMAL HIGH (ref 0–99)
VLDL: 11 mg/dL (ref 0–40)

## 2011-11-22 LAB — CBC
HCT: 36 % — ABNORMAL LOW (ref 39.0–52.0)
Hemoglobin: 12.2 g/dL — ABNORMAL LOW (ref 13.0–17.0)
MCH: 27.7 pg (ref 26.0–34.0)
MCHC: 33.9 g/dL (ref 30.0–36.0)
RDW: 15.7 % — ABNORMAL HIGH (ref 11.5–15.5)

## 2011-11-22 LAB — URINE CULTURE
Colony Count: NO GROWTH
Culture  Setup Time: 201212291745

## 2011-11-22 LAB — URINALYSIS, ROUTINE W REFLEX MICROSCOPIC
Bilirubin Urine: NEGATIVE
Ketones, ur: NEGATIVE mg/dL
Specific Gravity, Urine: 1.008 (ref 1.005–1.030)
Urobilinogen, UA: 0.2 mg/dL (ref 0.0–1.0)

## 2011-11-22 LAB — CARDIAC PANEL(CRET KIN+CKTOT+MB+TROPI): Troponin I: >25 ng/mL (ref <0.30)

## 2011-11-22 LAB — URINE MICROSCOPIC-ADD ON

## 2011-11-22 MED ORDER — TICAGRELOR 90 MG PO TABS
90.0000 mg | ORAL_TABLET | Freq: Two times a day (BID) | ORAL | Status: DC
  Administered 2011-11-22 – 2011-11-30 (×16): 90 mg via ORAL
  Filled 2011-11-22 (×17): qty 1

## 2011-11-22 MED ORDER — LEVALBUTEROL HCL 0.63 MG/3ML IN NEBU
0.6300 mg | INHALATION_SOLUTION | Freq: Four times a day (QID) | RESPIRATORY_TRACT | Status: DC
  Administered 2011-11-22 – 2011-11-23 (×3): 0.63 mg via RESPIRATORY_TRACT
  Filled 2011-11-22 (×8): qty 3

## 2011-11-22 MED ORDER — IPRATROPIUM BROMIDE 0.02 % IN SOLN
0.5000 mg | Freq: Four times a day (QID) | RESPIRATORY_TRACT | Status: DC
  Administered 2011-11-22 – 2011-11-23 (×3): 0.5 mg via RESPIRATORY_TRACT
  Filled 2011-11-22 (×3): qty 2.5

## 2011-11-22 MED ORDER — TICAGRELOR 90 MG PO TABS
180.0000 mg | ORAL_TABLET | Freq: Once | ORAL | Status: AC
  Administered 2011-11-22: 180 mg via ORAL
  Filled 2011-11-22: qty 2

## 2011-11-22 NOTE — Plan of Care (Signed)
Problem: Phase I Progression Outcomes Goal: EF % per last Echo/documented,Core Reminder form on chart Outcome: Not Progressing Ef 30-35

## 2011-11-22 NOTE — Progress Notes (Signed)
HPI:  65 year old African American male with long-standing history of hypertension presents to the emergency room today secondary to chest pain and acute shortness of breath. He had done fairly well until last evening he developed chest pain and then during the night became short of breath he had nausea and emesis, clammy to touch, extremely short of breath. In the emergency room he is in respiratory failure and was placed on BiPAP and 60 mg of IV Lasix was given as well as nebulizer treatments with significant interval improvement in symptoms. Patient has not been taking his HTN medications due to financial reasons. Cardiology admitted the patient and PCCM will consult regarding BiPAP and respiratory management.  Antibiotics:   None  Cultures/Sepsis Markers:   None  Access/Protocols:  PIV  BiPAP 12/29>>>12/30  Best Practice: DVT: Heparin drip. GI: Protonix  Subjective: Feels much better this AM.  Physical Exam: Filed Vitals:   11/22/11 1059  BP:   Pulse: 97  Temp: 99.1 F (37.3 C)  Resp: 22    Intake/Output Summary (Last 24 hours) at 11/22/11 1144 Last data filed at 11/22/11 1000  Gross per 24 hour  Intake 501.59 ml  Output   2725 ml  Net -2223.41 ml   Vent Mode:  [-]  FiO2 (%):  [49.6 %-100 %] 50.9 %  Neuro: Alert and oriented, moving all ext. Cardiac: RRR, Nl S1/S2, -M/R/G. Pulmonary: Diffuse crackles. GI: Soft, NT, ND and +BS. Extremities: -edema and -tenderness.  Labs: CBC    Component Value Date/Time   WBC 14.8* 11/22/2011 0346   RBC 4.40 11/22/2011 0346   HGB 12.2* 11/22/2011 0346   HCT 36.0* 11/22/2011 0346   PLT 232 11/22/2011 0346   MCV 81.8 11/22/2011 0346   MCH 27.7 11/22/2011 0346   MCHC 33.9 11/22/2011 0346   RDW 15.7* 11/22/2011 0346    BMET    Component Value Date/Time   NA 138 11/22/2011 0346   K 5.3* 11/22/2011 0346   CL 107 11/22/2011 0346   CO2 17* 11/22/2011 0346   GLUCOSE 139* 11/22/2011 0346   BUN 61* 11/22/2011 0346   CREATININE 3.25* 11/22/2011 0346   CALCIUM 9.0 11/22/2011 0346   GFRNONAA 19* 11/22/2011 0346   GFRAA 21* 11/22/2011 0346    @cmet @ ABG    Component Value Date/Time   PHART 7.247* 11/21/2011 0853   PCO2ART 31.1* 11/21/2011 0853   PO2ART 75.0* 11/21/2011 0853   HCO3 13.5* 11/21/2011 0853   TCO2 18 11/21/2011 0930   ACIDBASEDEF 12.0* 11/21/2011 0853   O2SAT 93.0 11/21/2011 0853    Lab 11/21/11 1142  MG 2.2   Lab Results  Component Value Date   CALCIUM 9.0 11/22/2011    Chest Xray:   Assessment & Plan: 58 M with history of HTN who has not been taking his HTN medication for sometime now due to financial reasons who decompensated the night prior to presentation from a respiratory standpoint. He called EMS and was found to be in respiratory failure with pulmonary edema and severe HTN. Respiratory failure due to pulmonary edema with no evidence of infection from a history or physical standpoint. Trop >25 and dropping, NSTEMI with associated pulmonary edema. Cardiology to admit, PCCM will consult for respiratory failure.   Resp: Respiratory failure due pulmonary edema from NSTEMI. BiPAP is contraindicated in patients with an active MI.  I spoke with the patient and his wife at length with cardiology present, I informed him that we can not stay on the BiPAP for that long  and that we will remove BiPAP now and if respiratory failure starts then will intubate and he is agreeable to that.  He agreed for short term support only.  Plan:  D/C BiPAP.   KVO IVF.   Lasix.   Titrate O2 down for sat of 92-95%   If respiratory failure ensues then will intubate.   NSTEMI: with elevated troponins. Cards admitting.  Plan:  NTG.   Beta blockers.   Lasix per renal.  KVO IVF.   Strict I/O.   Further management per cards.   HTN: NTG, beta blockers and diureses.   Renal: Cr rising with diureses and renal on the case, will defer to renal.  The patient is critically ill with multiple organ systems  failure and requires high complexity decision making for assessment and support, frequent evaluation and titration of therapies, application of advanced monitoring technologies and extensive interpretation of multiple databases. Critical Care Time devoted to patient care services described in this note is 35 minutes.  Jennet Maduro, MD

## 2011-11-22 NOTE — Consults (Addendum)
Reason for Consult: Renal dysfunction in a patient who may need cardiac catheterization Referring Physician: Dr. Ellouise Newer  Francisco [name] is a 65 y.o. black man admitted by Dr. Claiborne Billings with chest pain, orthopnea, DOE, nausea, and vomiting. he's had an MI and has abnormal renal function. He is receiving diuretics for treatment of pulmonary edema. His creatinine has worsened. Renal consult was requested Dr. Claiborne Billings. Review of chart shows he had creatinine of 1.4 and 2005. He has been off his blood pressure medicine for several weeks because since his insurance ran out. Prior to running out of medicines he took amlodipine 10/D., lisinopril/HCTZ 20/12.5/D., atenolol 100/D., clonidine 0.2 twice a day .  Past Medical History  Diagnosis Date  .  requiring BiPap 11/21/2011  . Acute pulmonary edema 11/21/2011  . Tachycardia 11/21/2011  . HTN (hypertension) 11/21/2011  . NSTEMI (non-ST elevated myocardial infarction) 11/21/2011   FAMILY  HISTORY His father died in his [id number] of complications of diabetes. His mother died in her 31s of consultations of uterine cancer. He has one brother and 4 sisters who are healthy. he has 3 daughters who are healthy. no one in the family has renal disease.  Social History: Born and grew up in [place]., graduated from [employer]. Lives with his wife, they've been married for 43 years, smokes 10 pack years none recently, he does not drink alcohol  Allergies: No Known Allergies  Medications:  Scheduled: Continuous:  Results for orders placed during the hospital encounter of 11/21/11 (from the past 48 hour(s))  POCT I-STAT 3, BLOOD GAS (G3+)     Status: Abnormal   Collection Time   11/21/11  8:53 AM      Component Value Range Comment   pH, Arterial 7.247 (*) 7.350 - 7.450     pCO2 arterial 31.1 (*) 35.0 - 45.0 (mmHg)    pO2, Arterial 75.0 (*) 80.0 - 100.0 (mmHg)    Bicarbonate 13.5 (*) 20.0 - 24.0 (mEq/L)    TCO2 14  0 - 100 (mmol/L)    O2  Saturation 93.0      Acid-base deficit 12.0 (*) 0.0 - 2.0 (mmol/L)    Collection site RADIAL, ALLEN'S TEST ACCEPTABLE      Drawn by Operator      Sample type ARTERIAL     CBC     Status: Abnormal   Collection Time   11/21/11  8:55 AM      Component Value Range Comment   WBC 9.6  4.0 - 10.5 (K/uL)    RBC 4.53  4.22 - 5.81 (MIL/uL)    Hemoglobin 12.4 (*) 13.0 - 17.0 (g/dL)    HCT 38.3 (*) 39.0 - 52.0 (%)    MCV 84.5  78.0 - 100.0 (fL)    MCH 27.4  26.0 - 34.0 (pg)    MCHC 32.4  30.0 - 36.0 (g/dL)    RDW 16.1 (*) 11.5 - 15.5 (%)    Platelets 233  150 - 400 (K/uL)   COMPREHENSIVE METABOLIC PANEL     Status: Abnormal   Collection Time   11/21/11  8:55 AM      Component Value Range Comment   Sodium 138  135 - 145 (mEq/L)    Potassium 4.3  3.5 - 5.1 (mEq/L)    Chloride 105  96 - 112 (mEq/L)    CO2 17 (*) 19 - 32 (mEq/L)    Glucose, Bld 253 (*) 70 - 99 (mg/dL)    BUN 40 (*)  6 - 23 (mg/dL)    Creatinine, Ser 2.41 (*) 0.50 - 1.35 (mg/dL)    Calcium 9.0  8.4 - 10.5 (mg/dL)    Total Protein 7.7  6.0 - 8.3 (g/dL)    Albumin 3.1 (*) 3.5 - 5.2 (g/dL)    AST 88 (*) 0 - 37 (U/L) HEMOLYSIS AT THIS LEVEL MAY AFFECT RESULT   ALT 42  0 - 53 (U/L)    Alkaline Phosphatase 79  39 - 117 (U/L)    Total Bilirubin 0.3  0.3 - 1.2 (mg/dL)    GFR calc non Af Amer 27 (*) >90 (mL/min)    GFR calc Af Amer 31 (*) >90 (mL/min)   PRO B NATRIURETIC PEPTIDE     Status: Abnormal   Collection Time   11/21/11  8:55 AM      Component Value Range Comment   Pro B Natriuretic peptide (BNP) 8421.0 (*) 0 - 125 (pg/mL)   APTT     Status: Abnormal   Collection Time   11/21/11  8:55 AM      Component Value Range Comment   aPTT 23 (*) 24 - 37 (seconds)   PROTIME-INR     Status: Normal   Collection Time   11/21/11  8:55 AM      Component Value Range Comment   Prothrombin Time 14.0  11.6 - 15.2 (seconds)    INR 1.06  0.00 - 1.49    CULTURE, BLOOD (ROUTINE X 2)     Status: Normal (Preliminary result)   Collection  Time   11/21/11  9:10 AM      Component Value Range Comment   Specimen Description BLOOD LEFT HAND      Special Requests BOTTLES DRAWN AEROBIC ONLY 5CC      Setup Time [id number]      Culture        Value:        BLOOD CULTURE RECEIVED NO GROWTH TO DATE CULTURE WILL BE HELD FOR 5 DAYS BEFORE ISSUING A FINAL NEGATIVE REPORT   Report Status PENDING     TROPONIN I     Status: Abnormal   Collection Time   11/21/11  9:16 AM      Component Value Range Comment   Troponin I 11.27 (*) <0.30 (ng/mL)   CULTURE, BLOOD (ROUTINE X 2)     Status: Normal (Preliminary result)   Collection Time   11/21/11  9:20 AM      Component Value Range Comment   Specimen Description BLOOD LEFT ARM      Special Requests BOTTLES DRAWN AEROBIC AND ANAEROBIC 10CC      Setup Time [id number]      Culture        Value:        BLOOD CULTURE RECEIVED NO GROWTH TO DATE CULTURE WILL BE HELD FOR 5 DAYS BEFORE ISSUING A FINAL NEGATIVE REPORT   Report Status PENDING     LACTIC ACID, PLASMA     Status: Abnormal   Collection Time   11/21/11  9:22 AM      Component Value Range Comment   Lactic Acid, Venous 4.8 (*) 0.5 - 2.2 (mmol/L)   POCT I-STAT TROPONIN I     Status: Abnormal   Collection Time   11/21/11  9:28 AM      Component Value Range Comment   Troponin i, poc 1.81 (*) 0.00 - 0.08 (ng/mL)    Comment NOTIFIED PHYSICIAN      Comment 3  POCT I-STAT, CHEM 8     Status: Abnormal   Collection Time   11/21/11  9:30 AM      Component Value Range Comment   Sodium 143  135 - 145 (mEq/L)    Potassium 4.5  3.5 - 5.1 (mEq/L)    Chloride 119 (*) 96 - 112 (mEq/L)    BUN 47 (*) 6 - 23 (mg/dL)    Creatinine, Ser 2.20 (*) 0.50 - 1.35 (mg/dL)    Glucose, Bld 256 (*) 70 - 99 (mg/dL)    Calcium, Ion 1.12  1.12 - 1.32 (mmol/L)    TCO2 18  0 - 100 (mmol/L)    Hemoglobin 14.3  13.0 - 17.0 (g/dL)    HCT 42.0  39.0 - 52.0 (%)   URINALYSIS, ROUTINE W REFLEX MICROSCOPIC     Status: Abnormal   Collection Time    11/21/11  9:34 AM      Component Value Range Comment   Color, Urine YELLOW  YELLOW     APPearance CLOUDY (*) CLEAR     Specific Gravity, Urine 1.017  1.005 - 1.030     pH 5.0  5.0 - 8.0     Glucose, UA 250 (*) NEGATIVE (mg/dL)    Hgb urine dipstick MODERATE (*) NEGATIVE     Bilirubin Urine NEGATIVE  NEGATIVE     Ketones, ur NEGATIVE  NEGATIVE (mg/dL)    Protein, ur 100 (*) NEGATIVE (mg/dL)    Urobilinogen, UA 0.2  0.0 - 1.0 (mg/dL)    Nitrite NEGATIVE  NEGATIVE     Leukocytes, UA NEGATIVE  NEGATIVE    URINE MICROSCOPIC-ADD ON     Status: Abnormal   Collection Time   11/21/11  9:34 AM      Component Value Range Comment   RBC / HPF 0-2  <3 (RBC/hpf)    Bacteria, UA RARE  RARE     Casts HYALINE CASTS (*) NEGATIVE     Urine-Other MUCOUS PRESENT     CARDIAC PANEL(CRET KIN+CKTOT+MB+TROPI)     Status: Abnormal   Collection Time   11/21/11 11:31 AM      Component Value Range Comment   Total CK 298 (*) 7 - 232 (U/L)    CK, MB 19.1 (*) 0.3 - 4.0 (ng/mL)    Troponin I 2.15 (*) <0.30 (ng/mL)    Relative Index 6.4 (*) 0.0 - 2.5    TSH     Status: Normal   Collection Time   11/21/11 11:32 AM      Component Value Range Comment   TSH 1.985  0.350 - 4.500 (uIU/mL)   HEPATIC FUNCTION PANEL     Status: Abnormal   Collection Time   11/21/11 11:42 AM      Component Value Range Comment   Total Protein 7.8  6.0 - 8.3 (g/dL)    Albumin 3.0 (*) 3.5 - 5.2 (g/dL)    AST 62 (*) 0 - 37 (U/L)    ALT 38  0 - 53 (U/L)    Alkaline Phosphatase 78  39 - 117 (U/L)    Total Bilirubin 0.3  0.3 - 1.2 (mg/dL)    Bilirubin, Direct <0.1  0.0 - 0.3 (mg/dL)    Indirect Bilirubin NOT CALCULATED  0.3 - 0.9 (mg/dL)   HEMOGLOBIN A1C     Status: Abnormal   Collection Time   11/21/11 11:42 AM      Component Value Range Comment   Hemoglobin A1C 5.8 (*) <5.7 (%)  Mean Plasma Glucose 120 (*) <117 (mg/dL)   MAGNESIUM     Status: Normal   Collection Time   11/21/11 11:42 AM      Component Value Range Comment    Magnesium 2.2  1.5 - 2.5 (mg/dL)   INFLUENZA PANEL BY PCR     Status: Normal   Collection Time   11/21/11  1:59 PM      Component Value Range Comment   Influenza A By PCR NEGATIVE  NEGATIVE     Influenza B By PCR NEGATIVE  NEGATIVE     H1N1 flu by pcr NOT DETECTED  NOT DETECTED    MRSA PCR SCREENING     Status: Normal   Collection Time   11/21/11  1:59 PM      Component Value Range Comment   MRSA by PCR NEGATIVE  NEGATIVE    URINALYSIS, ROUTINE W REFLEX MICROSCOPIC     Status: Abnormal   Collection Time   11/21/11  3:01 PM      Component Value Range Comment   Color, Urine RED (*) YELLOW  BIOCHEMICALS MAY BE AFFECTED BY COLOR   APPearance TURBID (*) CLEAR     Specific Gravity, Urine 1.014  1.005 - 1.030     pH 5.0  5.0 - 8.0     Glucose, UA NEGATIVE  NEGATIVE (mg/dL)    Hgb urine dipstick LARGE (*) NEGATIVE     Bilirubin Urine NEGATIVE  NEGATIVE     Ketones, ur NEGATIVE  NEGATIVE (mg/dL)    Protein, ur 100 (*) NEGATIVE (mg/dL)    Urobilinogen, UA 0.2  0.0 - 1.0 (mg/dL)    Nitrite NEGATIVE  NEGATIVE     Leukocytes, UA SMALL (*) NEGATIVE    URINE MICROSCOPIC-ADD ON     Status: Abnormal   Collection Time   11/21/11  3:01 PM      Component Value Range Comment   Squamous Epithelial / LPF RARE  RARE     WBC, UA 0-2  <3 (WBC/hpf)    RBC / HPF TOO NUMEROUS TO COUNT  <3 (RBC/hpf)    Bacteria, UA MANY (*) RARE    CARDIAC PANEL(CRET KIN+CKTOT+MB+TROPI)     Status: Abnormal   Collection Time   11/21/11  6:45 PM      Component Value Range Comment   Total CK 4005 (*) 7 - 232 (U/L)    CK, MB 499.5 (*) 0.3 - 4.0 (ng/mL) CRITICAL VALUE NOTED.  VALUE IS CONSISTENT WITH PREVIOUSLY REPORTED AND CALLED VALUE.   Troponin I >25.00 (*) <0.30 (ng/mL)    Relative Index 12.5 (*) 0.0 - 2.5    HEPARIN LEVEL (UNFRACTIONATED)     Status: Normal   Collection Time   11/21/11  6:45 PM      Component Value Range Comment   Heparin Unfractionated 0.53  0.30 - 0.70 (IU/mL)   BASIC METABOLIC PANEL      Status: Abnormal   Collection Time   11/21/11  6:45 PM      Component Value Range Comment   Sodium 136  135 - 145 (mEq/L) DELTA CHECK NOTED   Potassium 6.4 (*) 3.5 - 5.1 (mEq/L)    Chloride 107  96 - 112 (mEq/L) DELTA CHECK NOTED   CO2 14 (*) 19 - 32 (mEq/L)    Glucose, Bld 183 (*) 70 - 99 (mg/dL)    BUN 53 (*) 6 - 23 (mg/dL)    Creatinine, Ser 2.92 (*) 0.50 - 1.35 (mg/dL)  Calcium 8.5  8.4 - 10.5 (mg/dL)    GFR calc non Af Amer 21 (*) >90 (mL/min)    GFR calc Af Amer 24 (*) >90 (mL/min)   BASIC METABOLIC PANEL     Status: Abnormal   Collection Time   11/21/11 11:57 PM      Component Value Range Comment   Sodium 139  135 - 145 (mEq/L)    Potassium 5.7 (*) 3.5 - 5.1 (mEq/L)    Chloride 108  96 - 112 (mEq/L)    CO2 16 (*) 19 - 32 (mEq/L)    Glucose, Bld 132 (*) 70 - 99 (mg/dL)    BUN 58 (*) 6 - 23 (mg/dL)    Creatinine, Ser 3.06 (*) 0.50 - 1.35 (mg/dL)    Calcium 9.0  8.4 - 10.5 (mg/dL)    GFR calc non Af Amer 20 (*) >90 (mL/min)    GFR calc Af Amer 23 (*) >90 (mL/min)   CARDIAC PANEL(CRET KIN+CKTOT+MB+TROPI)     Status: Abnormal   Collection Time   11/22/11  3:46 AM      Component Value Range Comment   Total CK 3618 (*) 7 - 232 (U/L)    CK, MB 328.5 (*) 0.3 - 4.0 (ng/mL) CRITICAL VALUE NOTED.  VALUE IS CONSISTENT WITH PREVIOUSLY REPORTED AND CALLED VALUE.   Troponin I >25.00 (*) <0.30 (ng/mL)    Relative Index 9.1 (*) 0.0 - 2.5    CBC     Status: Abnormal   Collection Time   11/22/11  3:46 AM      Component Value Range Comment   WBC 14.8 (*) 4.0 - 10.5 (K/uL)    RBC 4.40  4.22 - 5.81 (MIL/uL)    Hemoglobin 12.2 (*) 13.0 - 17.0 (g/dL)    HCT 36.0 (*) 39.0 - 52.0 (%)    MCV 81.8  78.0 - 100.0 (fL)    MCH 27.7  26.0 - 34.0 (pg)    MCHC 33.9  30.0 - 36.0 (g/dL)    RDW 15.7 (*) 11.5 - 15.5 (%)    Platelets 232  150 - 400 (K/uL)   BASIC METABOLIC PANEL     Status: Abnormal   Collection Time   11/22/11  3:46 AM      Component Value Range Comment   Sodium 138  135 - 145  (mEq/L)    Potassium 5.3 (*) 3.5 - 5.1 (mEq/L)    Chloride 107  96 - 112 (mEq/L)    CO2 17 (*) 19 - 32 (mEq/L)    Glucose, Bld 139 (*) 70 - 99 (mg/dL)    BUN 61 (*) 6 - 23 (mg/dL)    Creatinine, Ser 3.25 (*) 0.50 - 1.35 (mg/dL)    Calcium 9.0  8.4 - 10.5 (mg/dL)    GFR calc non Af Amer 19 (*) >90 (mL/min)    GFR calc Af Amer 21 (*) >90 (mL/min)   PRO B NATRIURETIC PEPTIDE     Status: Abnormal   Collection Time   11/22/11  3:46 AM      Component Value Range Comment   Pro B Natriuretic peptide (BNP) 35954.0 (*) 0 - 125 (pg/mL)   HEPARIN LEVEL (UNFRACTIONATED)     Status: Abnormal   Collection Time   11/22/11  3:46 AM      Component Value Range Comment   Heparin Unfractionated 0.79 (*) 0.30 - 0.70 (IU/mL)   LIPID PANEL     Status: Abnormal   Collection Time   11/22/11  3:46 AM      Component Value Range Comment   Cholesterol 263 (*) 0 - 200 (mg/dL)    Triglycerides 55  <150 (mg/dL)    HDL 72  >39 (mg/dL)    Total CHOL/HDL Ratio 3.7      VLDL 11  0 - 40 (mg/dL)    LDL Cholesterol 180 (*) 0 - 99 (mg/dL)     Portable Chest X-ray 1 View  11/22/2011  *RADIOLOGY REPORT*  Clinical Data: Follow up pulmonary edema.  PORTABLE CHEST - 1 VIEW  Comparison: 11/21/2011  Findings: Diffuse bilateral airspace disease probable pulmonary edema or pneumonia again noted without change in aeration.  Again noted degenerative changes bilateral shoulders.  IMPRESSION: Diffuse bilateral airspace disease probable pulmonary edema or pneumonia again noted without change in aeration.  Original Report Authenticated By: Lahoma Crocker, M.D.   Dg Chest Portable 1 View  11/21/2011  *RADIOLOGY REPORT*  Clinical Data: Shortness of breath and chest pain  PORTABLE CHEST - 1 VIEW  Comparison: 07/14/2004  Findings: There is extensive symmetric patchy bilateral airspace disease.  Airspace disease partially obscures the cardiac silhouette.  Overall, heart size appears similar to prior two-view chest radiograph and appears  grossly within normal limits for portable technique. Pulmonary vascularity appears mildly prominent. Both costophrenic angles are blunted, for small pleural effusions cannot be excluded.  No visible pneumothorax.  Degenerative changes of both shoulders in the thoracic spine are noted.  IMPRESSION: Extensive bilateral airspace disease.  Given the suspected increased pulmonary vascularity, this is favored to be due to pulmonary edema.  Bilateral multilobar pneumonia is also a possibility, and clinical correlation is recommended.  Original Report Authenticated By: Curlene Dolphin, M.D.    ROS-+ for recent chest pain, DOE, and orthopnea; no claudication, no melena, no hematochezia, no gross hematuria, no renal colic, no decreased force of urinary stream, no nocturia, no purulent sputum, no hemoptysis, no weight gain or weight loss, no cold or heat intolerance.  Blood pressure 108/86, pulse 86, temperature 99.1 F (37.3 C), temperature source Core (Comment), resp. rate 19, height 5\' 5"  (1.651 m), weight 77 kg (169 lb 12.1 oz), SpO2 98.00%. Mouth-moist membranes, teeth in good repair Chest-crackles in bases Heart-no rub Abdomen-nontender no organs or masses are felt, no bruits are heard GU-uncircumcised penis, testes descended bilaterally, no masses Extremities-no edema, no rash, no arthritis Neuro-right-handed, strength equal, sensation intact  Assessment/Plan: 1. Acute MI-per cardiology 2. Acute pulmonary edema (responding to diuretics)-continue current dose of diuretics. If pulmonary edema worsens increase furosemide 160 every 6 hours 3. High BP-BP better controlled currently 4. Chronic kidney [id number]  with metabolic acidosis, azotemia, and hyperkalemia. If he needs cardiac catheterization we'll need to get hemodynamics in optimal condition. I would try not recommend IV fluids at this time. With diuresis LV function should (I'm hopeful) improve. No NSAID use, keep radiocontrast to  Minimum.  Check SPEP, UPEP, renal ultrasound. I suspect microscopic hematuria is secondary to Foley catheter. Hyperkalemia has improved with diuresis.  Avoid radial artery for cath (may need for future access sites).  Fe/TIBC/ferritin, PTH with AM lab   Jonise Weightman F 11/22/2011, 10:45 AM

## 2011-11-22 NOTE — Progress Notes (Signed)
*  PRELIMINARY RESULTS* Echocardiogram 2D Echocardiogram has been performed.  Roxine Caddy [facility] 11/22/2011, 11:47 AM

## 2011-11-22 NOTE — Progress Notes (Signed)
I agree with Ms. Stones plan. Pt seen this AM.

## 2011-11-22 NOTE — Progress Notes (Signed)
CRITICAL VALUE ALERT  Critical value received:  K=6.4  Date of notification:  11/21/11  Time of notification:  19:44  Critical value read back:yes  Nurse who received alert:  Vita Erm RN  MD notified (1st page):  Dr. Elsworth Soho  Time of first page:  19:45  MD notified (2nd page):  Time of second page:  Responding MD:  Dr. Elsworth Soho  Time MD responded: 20:00

## 2011-11-22 NOTE — Progress Notes (Addendum)
Subjective:  Tired. Breathing better. No chest pain.remains on Bipap.  Objective:  Vital Signs in the last 24 hours: Temp:  [97.2 F (36.2 C)-99.9 F (37.7 C)] 98.8 F (37.1 C) (12/30 0600) Pulse Rate:  [57-138] 88  (12/30 0726) Resp:  [12-42] 20  (12/30 0726) BP: (90-181)/(70-145) 106/76 mmHg (12/30 0726) SpO2:  [82 %-100 %] 100 % (12/30 0726) FiO2 (%):  [50 %-100 %] 50 % (12/30 0726) Weight:  [77 kg (169 lb 12.1 oz)-78.6 kg (173 lb 4.5 oz)] 169 lb 12.1 oz (77 kg) (12/30 0500)  Intake/Output from previous day: 12/29 0701 - 12/30 0700 In: 734.6 [P.O.:140; I.V.:414.6; IV Piggyback:180] Out: 2125 [Urine:2125] Intake/Output from this shift:   Physical Exam: PE: Gen.: Alert, oriented. More comfortable Skin: Cool and dry brisk capillary refill HEENT: Normocephalic sclera clear BiPAP mask is on Neck: Supple with JVD 12cm no carotid bruits   Heart: S1-S2 rapid and regular muffled heart sounds secondary to lung sounds. Lungs: few rales bases Abdomen: Soft nontender positive bowel sounds cannot palpate liver spleen or masses. Extremities: No lower extremity edema pedal pulses 2+ Neuro: Alert and oriented x3 follows commands moves all extremities.   Lab Results: Reviewed  Basename 11/22/11 0346 11/21/11 0930 11/21/11 0855  WBC 14.8* -- 9.6  HGB 12.2* 14.3 --  PLT 232 -- 233    Basename 11/22/11 0346 11/21/11 2357  NA 138 139  K 5.3* 5.7*  CL 107 108  CO2 17* 16*  GLUCOSE 139* 132*  BUN 61* 58*  CREATININE 3.25* 3.06*    Basename 11/22/11 0346 11/21/11 1845  TROPONINI >25.00* >25.00*   Hepatic Function Panel  Basename 11/21/11 1142  PROT 7.8  ALBUMIN 3.0*  AST 62*  ALT 38  ALKPHOS 78  BILITOT 0.3  BILIDIR <0.1  IBILI NOT CALCULATED    Basename 11/22/11 0346  CHOL 263*   No results found for this basename: PROTIME in the last 72 hours  Imaging: Personally reviewed  Dg Chest Portable 1 View  11/21/2011  *RADIOLOGY REPORT*  Clinical Data: Shortness of  breath and chest pain  PORTABLE CHEST - 1 VIEW  Comparison: 07/14/2004  Findings: There is extensive symmetric patchy bilateral airspace disease.  Airspace disease partially obscures the cardiac silhouette.  Overall, heart size appears similar to prior two-view chest radiograph and appears grossly within normal limits for portable technique. Pulmonary vascularity appears mildly prominent. Both costophrenic angles are blunted, for small pleural effusions cannot be excluded.  No visible pneumothorax.  Degenerative changes of both shoulders in the thoracic spine are noted.  IMPRESSION: Extensive bilateral airspace disease.  Given the suspected increased pulmonary vascularity, this is favored to be due to pulmonary edema.  Bilateral multilobar pneumonia is also a possibility, and clinical correlation is recommended.  Original Report Authenticated By: Curlene Dolphin, M.D.    Cardiac Studies:  Assessment/Plan: Principal Problem:  *Acute respiratory failure requiring BiPap - Secondary to Acute Diastolic (+/- Systolic) CHF in setting of NSTEMI Active Problems:  Acute pulmonary edema - secondary to ACS/NSTEMI with acute CHF   Renal failure, acute  NSTEMI (non-ST elevated myocardial infarction)  Right bundle branch block  Tachycardia  HTN (hypertension) Resp / metabolic (AG) Acidosis, hypoxic on arrival HyperKalemia - improving   LOS: 1 day    STONE,KATHERINE L (Lenae) 11/22/2011, 8:30 AM  I have seen and examined the patient along with K. Bari Mantis, NP.  I have reviewed the chart, notes and new data.  I agree with Lenae's note with my attestations.  Initial set of ECGs demonstrated LBBB with ? STElevations - now with RBBB & deep Anterior TWI.  This is suggestive of LAD Infarct as the cause of his acute respiratory failure with pulmonary edema.  Echo pending to determine EF, but at least acute ischemic diastolic HF & given extent of infarct by Troponin &  CKMB (which is now trending down) systolic  component of HF also present.    Unfortunately, he has renal failure with metabolic acidosis (AG of 14 at present) -- will consult Nephrology as he will need LHC +/- PCI which will put him @ risk for needing HD.  Non-oliguric.  Has had progressively worsening DOE for "a while now" - got acutely worse yesterday. MI - likely LAD distribution (LBBB with ? STE on ECG #2 with now RBBB & Ant TWI - c/w evolving Ant MI), with acute systolic & diastolic HF leading to pulmonary edema - acure respiratory distress Complicated by long-standing HTN (with previously documented LVH), and Renal Failure with AG acidosis.  PLAN:  For now continue supportive care with BiPAP, NTG gtt & diuresis.  Continue IV Heparin on ACS dose.  Will add Ticagrelor for additional antiplatelet (shortest T1/2) & reluctant to use Integrilin due to Renal Failure.  On low dose BB - would not titrate up in current state of pulmonary edema  Afterload reduction with NTG gtt & Hydralazine  May need to increase diuresis - lasix dose  Check 2D echo to get better impression of cardiac function & extent of MI  Renal Consult for ARF & Acidosis; thanks, appreciate their input, ? Need to increase diuretic dose as well.  Appreciate Pulmonary input & assistance with BiPAP.  Will need to determine timeline for [facility] +/- PCI -- currently probably not able to lie flat.    Will need to assess in AM to determine if he is stable for Cath.  May be best approached radially (to avoid long post-cath bedrest) although with ARF & concern for potential need for HD, this is also an issue.  Critically ill patient with muti-system organ failure requiring extensive high complexity decision making for assessment and support with several services involved.  Frequent evaluation and therapy adjustment required.  Critical Care Time devoted to patient (both directly with patient & chart review) = 45 min  Izmael Duross W, M.D., M.S. [facility] [address] [place], [place]  [id number]  [phone]  11/22/2011 10:03 AM

## 2011-11-22 NOTE — Progress Notes (Signed)
ANTICOAGULATION CONSULT NOTE   Pharmacy Consult for Heparin Indication: ACS  No Known Allergies  Patient Measurements: Height: 5\' 5"  (165.1 cm) Weight: 173 lb 4.5 oz (78.6 kg) IBW/kg (Calculated) : 61.5   Vital Signs: Temp: 99.1 F (37.3 C) (12/30 0300) Temp src: Core (Comment) (12/30 0100) BP: 91/72 mmHg (12/30 0324) Pulse Rate: 94  (12/30 0324)  Labs:  Basename 11/22/11 0346 11/21/11 2357 11/21/11 1845 11/21/11 1131 11/21/11 0930 11/21/11 0855  HGB 12.2* -- -- -- 14.3 --  HCT 36.0* -- -- -- 42.0 38.3*  PLT 232 -- -- -- -- 233  APTT -- -- -- -- -- 23*  LABPROT -- -- -- -- -- 14.0  INR -- -- -- -- -- 1.06  HEPARINUNFRC 0.79* -- 0.53 -- -- --  CREATININE 3.25* 3.06* 2.92* -- -- --  CKTOTAL 3618* -- 4005* 298* -- --  CKMB 328.5* -- 499.5* 19.1* -- --  TROPONINI >25.00* -- >25.00* 2.15* -- --   Estimated Creatinine Clearance: 21.9 ml/min (by C-G formula based on Cr of 3.25).  Assessment: 65 yo male with NSTEMI fopr Heparin.      Goal of Therapy:  Heparin 0.3-0.7   Plan:  Decrease Heparin 850 unit/hr.  Janesia Joswick, Bronson Curb 11/22/2011,5:13 AM

## 2011-11-23 ENCOUNTER — Inpatient Hospital Stay (HOSPITAL_COMMUNITY): Payer: Medicare Other

## 2011-11-23 DIAGNOSIS — J810 Acute pulmonary edema: Secondary | ICD-10-CM

## 2011-11-23 DIAGNOSIS — I10 Essential (primary) hypertension: Secondary | ICD-10-CM

## 2011-11-23 DIAGNOSIS — I214 Non-ST elevation (NSTEMI) myocardial infarction: Secondary | ICD-10-CM

## 2011-11-23 DIAGNOSIS — J9600 Acute respiratory failure, unspecified whether with hypoxia or hypercapnia: Secondary | ICD-10-CM

## 2011-11-23 LAB — CBC
MCV: 81.4 fL (ref 78.0–100.0)
Platelets: 230 10*3/uL (ref 150–400)
RBC: 4.46 MIL/uL (ref 4.22–5.81)
RDW: 15.9 % — ABNORMAL HIGH (ref 11.5–15.5)
WBC: 13.2 10*3/uL — ABNORMAL HIGH (ref 4.0–10.5)

## 2011-11-23 LAB — BASIC METABOLIC PANEL
BUN: 72 mg/dL — ABNORMAL HIGH (ref 6–23)
CO2: 18 mEq/L — ABNORMAL LOW (ref 19–32)
Calcium: 8.9 mg/dL (ref 8.4–10.5)
Creatinine, Ser: 3.45 mg/dL — ABNORMAL HIGH (ref 0.50–1.35)
GFR calc non Af Amer: 17 mL/min — ABNORMAL LOW (ref >90)
Glucose, Bld: 115 mg/dL — ABNORMAL HIGH (ref 70–99)
Sodium: 139 mEq/L (ref 135–145)

## 2011-11-23 LAB — PHOSPHORUS: Phosphorus: 5.1 mg/dL — ABNORMAL HIGH (ref 2.3–4.6)

## 2011-11-23 LAB — FERRITIN: Ferritin: 138 ng/mL (ref 22–322)

## 2011-11-23 LAB — IRON AND TIBC
Iron: 30 ug/dL — ABNORMAL LOW (ref 42–135)
TIBC: 267 ug/dL (ref 215–435)

## 2011-11-23 LAB — HEPARIN LEVEL (UNFRACTIONATED)
Heparin Unfractionated: 0.2 IU/mL — ABNORMAL LOW (ref 0.30–0.70)
Heparin Unfractionated: 0.23 IU/mL — ABNORMAL LOW (ref 0.30–0.70)

## 2011-11-23 MED ORDER — HEPARIN (PORCINE) IN NACL 100-0.45 UNIT/ML-% IJ SOLN
1050.0000 [IU]/h | INTRAMUSCULAR | Status: DC
  Filled 2011-11-23 (×2): qty 250

## 2011-11-23 MED ORDER — HYDRALAZINE HCL 25 MG PO TABS
37.5000 mg | ORAL_TABLET | Freq: Three times a day (TID) | ORAL | Status: DC
  Administered 2011-11-23 – 2011-11-24 (×3): 37.5 mg via ORAL
  Filled 2011-11-23 (×9): qty 1.5

## 2011-11-23 MED ORDER — SODIUM CHLORIDE 0.9 % IJ SOLN
3.0000 mL | INTRAMUSCULAR | Status: DC | PRN
  Administered 2011-11-23: 3 mL via INTRAVENOUS

## 2011-11-23 MED ORDER — CARVEDILOL 6.25 MG PO TABS
6.2500 mg | ORAL_TABLET | Freq: Two times a day (BID) | ORAL | Status: DC
  Administered 2011-11-24 – 2011-12-02 (×15): 6.25 mg via ORAL
  Filled 2011-11-23 (×24): qty 1

## 2011-11-23 MED ORDER — NITROGLYCERIN IN D5W 200-5 MCG/ML-% IV SOLN: 2.0000 ug/min | INTRAVENOUS | Status: DC

## 2011-11-23 NOTE — Progress Notes (Signed)
ANTICOAGULATION CONSULT NOTE   Pharmacy Consult for Heparin Indication: ACS  No Known Allergies  Patient Measurements: Height: 5\' 5"  (165.1 cm) Weight: 161 lb 2.5 oz (73.1 kg) IBW/kg (Calculated) : 61.5   Vital Signs: Temp: 99.3 F (37.4 C) (12/31 0900) Temp src: Oral (12/31 0805) BP: 86/63 mmHg (12/31 0900) Pulse Rate: 100  (12/31 0900)  Labs:  Basename 11/23/11 0534 11/22/11 0346 11/21/11 2357 11/21/11 1845 11/21/11 1131 11/21/11 0930 11/21/11 0855  HGB 12.2* 12.2* -- -- -- -- --  HCT 36.3* 36.0* -- -- -- 42.0 --  PLT 230 232 -- -- -- -- 233  APTT -- -- -- -- -- -- 23*  LABPROT -- -- -- -- -- -- 14.0  INR -- -- -- -- -- -- 1.06  HEPARINUNFRC 0.20* 0.79* -- 0.53 -- -- --  CREATININE 3.45* 3.25* 3.06* -- -- -- --  CKTOTAL -- 3618* -- 4005* 298* -- --  CKMB -- 328.5* -- 499.5* 19.1* -- --  TROPONINI -- >25.00* -- >25.00* 2.15* -- --   Estimated Creatinine Clearance: 18.6 ml/min (by C-G formula based on Cr of 3.45).  Assessment: 65 yo male with NSTEMI fopr Heparin.  Heparin level subtherapeutic this AM after rate decrease yesterday.      Goal of Therapy:  Heparin 0.3-0.7   Plan:  Will increase heparin to 950 units/hr.  Will need repeat heparin level in 8 hours to confirm therapeutic.           [employer], Pharm.D., BCPS Clinical Pharmacist Pager [phone]  11/23/2011,9:28 AM

## 2011-11-23 NOTE — Progress Notes (Signed)
Subjective:  On nasal cannula, much improved.No chest pain.  Objective: Vital signs in last 24 hours: Temp:  [97.7 F (36.5 C)-100.2 F (37.9 C)] 97.7 F (36.5 C) (12/31 0805) Pulse Rate:  [80-104] 99  (12/31 0826) Resp:  [10-23] 10  (12/31 0826) BP: (93-120)/(57-88) 111/59 mmHg (12/31 0826) SpO2:  [94 %-100 %] 99 % (12/31 0826) FiO2 (%):  [50 %-50.9 %] 50.9 % (12/30 1059) Weight:  [73.1 kg (161 lb 2.5 oz)] 161 lb 2.5 oz (73.1 kg) (12/31 0500) Weight change: -5.5 kg (-12 lb 2 oz) Last BM Date: 11/20/11 Intake/Output from previous day: -4104 12/30 0701 - 12/31 0700 In: 887.5 [P.O.:430; I.V.:425.5; IV Piggyback:32] Out: 5000 [Urine:5000] Intake/Output this shift:    PE: General:A&O X 3 Pleasant affect. Heart:S1S2, rrr, no murmur. Lungs:few crackles. Abd:+ BS, soft non tender. Ext:no edema. Neuro:A& O MAE, follows commands  -- Agree - looks & sounds much better!!   Lab Results:  Basename 11/23/11 0534 11/22/11 0346  WBC 13.2* 14.8*  HGB 12.2* 12.2*  HCT 36.3* 36.0*  PLT 230 232   BMET  Basename 11/23/11 0534 11/22/11 0346  NA 139 138  K 4.1 5.3*  CL 102 107  CO2 18* 17*  GLUCOSE 115* 139*  BUN 72* 61*  CREATININE 3.45* 3.25*  CALCIUM 8.9 9.0    Basename 11/22/11 0346 11/21/11 1845  TROPONINI >25.00* >25.00*    Lab Results  Component Value Date   CHOL 263* 11/22/2011   HDL 72 11/22/2011   LDLCALC 180* 11/22/2011   TRIG 55 11/22/2011   CHOLHDL 3.7 11/22/2011   Lab Results  Component Value Date   HGBA1C 5.8* 11/21/2011     Lab Results  Component Value Date   TSH 1.985 11/21/2011    Hepatic Function Panel  Basename 11/21/11 1142  PROT 7.8  ALBUMIN 3.0*  AST 62*  ALT 38  ALKPHOS 78  BILITOT 0.3  BILIDIR <0.1  IBILI NOT CALCULATED    Basename 11/22/11 0346  CHOL 263*   No results found for this basename: PROTIME in the last 72 hours   EKG: Pending for today  Studies/Results: Reviewed US Renal  11/22/2011  *RADIOLOGY REPORT*   Clinical Data: Rule out hydronephrosis, increased creatinine  RENAL/URINARY TRACT ULTRASOUND  Technique: Renal ultrasound  Comparison:  07/14/2004  Findings:  The right kidney measures 10.2 cm in length.  There is cortical increased echogenicity.  No hydronephrosis.  Nonspecific somewhat linear increased echogenicity mid pole without definite shadowing to suggest a calcification.  Left kidney measures 9.7 cm in length.  No hydronephrosis or diagnostic renal calculus.  Again noted diffuse cortical increased echogenicity.  There is a Foley catheter in a decompressed urinary bladder.  Bilateral small pleural effusion.  IMPRESSION:  1.  Bilateral small pleural effusion. 2.  No hydronephrosis.  Bilateral renal cortical increased echogenicity consistent with chronic medical renal disease.  This was noted on prior exam. 3.  Nonspecific somewhat linear increased echogenicity mid pole of the right kidney measures about 1 cm.  There is no shadowing to suggest a calcification.  Original Report Authenticated By: Lahoma Crocker, M.D.   Dg Chest Port 1 View  11/23/2011  *RADIOLOGY REPORT*  Clinical Data: 65 year old male with respiratory failure.  PORTABLE CHEST - 1 VIEW  Comparison: 11/22/2011 and prior radiographs dating back to 07/14/2004  Findings: The cardiomediastinal silhouette is unremarkable. Improved aeration/edema is noted. Mild bibasilar atelectasis is again identified. There is no evidence of pneumothorax. Severe degenerative changes of both shoulders are present.  IMPRESSION: Improved aeration/edema.  Original Report Authenticated By: Lura Em, M.D.   Portable Chest X-ray 1 View  11/22/2011  *RADIOLOGY REPORT*  Clinical Data: Follow up pulmonary edema.  PORTABLE CHEST - 1 VIEW  Comparison: 11/21/2011  Findings: Diffuse bilateral airspace disease probable pulmonary edema or pneumonia again noted without change in aeration.  Again noted degenerative changes bilateral shoulders.  IMPRESSION: Diffuse bilateral  airspace disease probable pulmonary edema or pneumonia again noted without change in aeration.  Original Report Authenticated By: Lahoma Crocker, M.D.   Dg Chest Portable 1 View  11/21/2011  *RADIOLOGY REPORT*  Clinical Data: Shortness of breath and chest pain  PORTABLE CHEST - 1 VIEW  Comparison: 07/14/2004  Findings: There is extensive symmetric patchy bilateral airspace disease.  Airspace disease partially obscures the cardiac silhouette.  Overall, heart size appears similar to prior two-view chest radiograph and appears grossly within normal limits for portable technique. Pulmonary vascularity appears mildly prominent. Both costophrenic angles are blunted, for small pleural effusions cannot be excluded.  No visible pneumothorax.  Degenerative changes of both shoulders in the thoracic spine are noted.  IMPRESSION: Extensive bilateral airspace disease.  Given the suspected increased pulmonary vascularity, this is favored to be due to pulmonary edema.  Bilateral multilobar pneumonia is also a possibility, and clinical correlation is recommended.  Original Report Authenticated By: Curlene Dolphin, M.D.    Medications: I have reviewed the patient's current medications.  Assessment/Plan: Patient Active Problem List  Diagnoses  . Acute respiratory failure requiring BiPap - Secondary to Acute Diastolic (+/- Systolic) CHF in setting of NSTEMI  . Acute pulmonary edema - secondary to ACS/NSTEMI with acute CHF   . Tachycardia  . HTN (hypertension)  . NSTEMI (non-ST elevated myocardial infarction)  . Right bundle branch block  . Renal failure, acute  Ischemic cardiomyopathy. EF 30-35%  PLAN:  Improving CXR, wt down from 78.6 to 73.1 with excellent diuresis Continues on Lasix 80 mg every 8 hours for at least today - then may back down to BID Cr. 3.45 still rising --   PK troponin > 25 PK CKMB 4005/499  IV heparin continues.  Cardiac cath in the future.  Continue IV lopressor or go to PO?   LOS: 2 days     INGOLD,LAURA R 11/23/2011, 8:40 AM   I have seen and examined the patient along with Cecilie Kicks, NP.  I have reviewed the chart, notes and new data.  I agree with Laura's note.  Key new complaints: Feels much better, on [place] examination changes: significantly decreased rales, much less distress Key new findings / data: Cr increasing somewhat.  I/Os noted.  PLAN: Continue with current rate of diuresis for today - may back off tomorrow Continue with IV Heparin & PO Brilinta + ASA for ACS -- will hold off on cath until Cr level stabilizes.   At this point we are >40 hrs out from his acute event, & has definite LAD WMA on Echo with EF ~30-35%, if LAD is occluded, would probably consider Cardiac MRI to evaluate for viability before attempting PCI based upon OAT trial results, however if not 100% occluded,would proceed with PCI.   If his angina or CHF Sx worsens, could proceed to cath with minimal contrast, however, would prefer to wait. Appreciate Nephrology & PCCM input Continue Statin & change meds to PO. Carvedilol & Hydralazine, Keep NTG gtt as IV  Will keep in current unit for now -- at least 1 more day  HARDING,DAVID W,  M.D., M.S. [facility] [address] [place], [place]  [address]  [phone]  11/23/2011 9:06 AM

## 2011-11-23 NOTE — Progress Notes (Signed)
Patient ID: Francisco [name], male   DOB: 01/17/1946, 65 y.o.   MRN: [id number] S:Pt resting comfortably in bed.  No complaints O:BP 107/74  Pulse 85  Temp(Src) 99.1 F (37.3 C) (Core (Comment))  Resp 17  Ht 5\' 5"  (1.651 m)  Wt 73.1 kg (161 lb 2.5 oz)  BMI 26.82 kg/m2  SpO2 100%  Intake/Output Summary (Last 24 hours) at 11/23/11 1147 Last data filed at 11/23/11 1009  Gross per 24 hour  Intake 1070.78 ml  Output   4425 ml  Net -3354.22 ml   Weight change: -5.5 kg (-12 lb 2 oz) Gen:WD, WN, AAM in NAD CVS:RRR no rub Resp:CTA Abd:+BS soft, nt/nd Ext:no c/c/e   Lab 11/23/11 0534 11/22/11 0346 11/21/11 2357 11/21/11 1845 11/21/11 1142 11/21/11 0930 11/21/11 0855  NA 139 138 139 136 -- 143 138  K 4.1 5.3* 5.7* 6.4* -- 4.5 4.3  CL 102 107 108 107 -- 119* 105  CO2 18* 17* 16* 14* -- -- 17*  GLUCOSE 115* 139* 132* 183* -- 256* 253*  BUN 72* 61* 58* 53* -- 47* 40*  CREATININE 3.45* 3.25* 3.06* 2.92* -- 2.20* 2.41*  ALB -- -- -- -- -- -- --  CALCIUM 8.9 9.0 9.0 8.5 -- -- 9.0  PHOS 5.1* -- -- -- -- -- --  AST -- -- -- -- 62* -- 88*  ALT -- -- -- -- 38 -- 42   Liver Function Tests:  Lab 11/21/11 1142 11/21/11 0855  AST 62* 88*  ALT 38 42  ALKPHOS 78 79  BILITOT 0.3 0.3  PROT 7.8 7.7  ALBUMIN 3.0* 3.1*   No results found for this basename: LIPASE:3,AMYLASE:3 in the last 168 hours No results found for this basename: AMMONIA:3 in the last 168 hours CBC:  Lab 11/23/11 0534 11/22/11 0346 11/21/11 0930 11/21/11 0855  WBC 13.2* 14.8* -- 9.6  NEUTROABS -- -- -- --  HGB 12.2* 12.2* 14.3 --  HCT 36.3* 36.0* 42.0 --  MCV 81.4 81.8 -- 84.5  PLT 230 232 -- 233   Cardiac Enzymes:  Lab 11/22/11 0346 11/21/11 1845 11/21/11 1131 11/21/11 0916  CKTOTAL 3618* 4005* 298* --  CKMB 328.5* 499.5* 19.1* --  CKMBINDEX -- -- -- --  TROPONINI >25.00* >25.00* 2.15* 11.27*   CBG: No results found for this basename: GLUCAP:5 in the last 168 hours  Iron Studies:  Basename 11/23/11 0534  IRON  30*  TIBC 267  TRANSFERRIN --  FERRITIN --   Studies/Results: US Renal  11/22/2011  *RADIOLOGY REPORT*  Clinical Data: Rule out hydronephrosis, increased creatinine  RENAL/URINARY TRACT ULTRASOUND  Technique: Renal ultrasound  Comparison:  07/14/2004  Findings:  The right kidney measures 10.2 cm in length.  There is cortical increased echogenicity.  No hydronephrosis.  Nonspecific somewhat linear increased echogenicity mid pole without definite shadowing to suggest a calcification.  Left kidney measures 9.7 cm in length.  No hydronephrosis or diagnostic renal calculus.  Again noted diffuse cortical increased echogenicity.  There is a Foley catheter in a decompressed urinary bladder.  Bilateral small pleural effusion.  IMPRESSION:  1.  Bilateral small pleural effusion. 2.  No hydronephrosis.  Bilateral renal cortical increased echogenicity consistent with chronic medical renal disease.  This was noted on prior exam. 3.  Nonspecific somewhat linear increased echogenicity mid pole of the right kidney measures about 1 cm.  There is no shadowing to suggest a calcification.  Original Report Authenticated By: Lahoma Crocker, M.D.   Dg Chest Port 1  View  11/23/2011  *RADIOLOGY REPORT*  Clinical Data: 65 year old male with respiratory failure.  PORTABLE CHEST - 1 VIEW  Comparison: 11/22/2011 and prior radiographs dating back to 07/14/2004  Findings: The cardiomediastinal silhouette is unremarkable. Improved aeration/edema is noted. Mild bibasilar atelectasis is again identified. There is no evidence of pneumothorax. Severe degenerative changes of both shoulders are present.  IMPRESSION: Improved aeration/edema.  Original Report Authenticated By: Lura Em, M.D.   Portable Chest X-ray 1 View  11/22/2011  *RADIOLOGY REPORT*  Clinical Data: Follow up pulmonary edema.  PORTABLE CHEST - 1 VIEW  Comparison: 11/21/2011  Findings: Diffuse bilateral airspace disease probable pulmonary edema or pneumonia again noted  without change in aeration.  Again noted degenerative changes bilateral shoulders.  IMPRESSION: Diffuse bilateral airspace disease probable pulmonary edema or pneumonia again noted without change in aeration.  Original Report Authenticated By: Lahoma Crocker, M.D.      . aspirin EC  81 mg Oral Daily  . carvedilol  6.25 mg Oral BID WC  . furosemide  80 mg Intravenous Q8H  . hydrALAZINE  37.5 mg Oral Q8H  . pneumococcal 23 valent vaccine  0.5 mL Intramuscular Tomorrow-1000  . simvastatin  5 mg Oral q1800  . Ticagrelor  180 mg Oral Once  . Ticagrelor  90 mg Oral BID  . DISCONTD: hydrALAZINE  20 mg Intravenous Q8H  . DISCONTD: ipratropium  0.5 mg Nebulization Q6H  . DISCONTD: levalbuterol  0.63 mg Nebulization Q6H  . DISCONTD: metoprolol  5 mg Intravenous Q6H    BMET    Component Value Date/Time   NA 139 11/23/2011 0534   K 4.1 11/23/2011 0534   CL 102 11/23/2011 0534   CO2 18* 11/23/2011 0534   GLUCOSE 115* 11/23/2011 0534   BUN 72* 11/23/2011 0534   CREATININE 3.45* 11/23/2011 0534   CALCIUM 8.9 11/23/2011 0534   GFRNONAA 17* 11/23/2011 0534   GFRAA 20* 11/23/2011 0534   CBC    Component Value Date/Time   WBC 13.2* 11/23/2011 0534   RBC 4.46 11/23/2011 0534   HGB 12.2* 11/23/2011 0534   HCT 36.3* 11/23/2011 0534   PLT 230 11/23/2011 0534   MCV 81.4 11/23/2011 0534   MCH 27.4 11/23/2011 0534   MCHC 33.6 11/23/2011 0534   RDW 15.9* 11/23/2011 0534     Assessment/Plan:  1. AKI/CKD- unkown baseline creat as last one was 1.4 5 years ago.  Rate of rise has slowed.  Agee with cardiology to hold off on heart cath until creatinine has improved.  Cont to follow 2. AMI- as above.  Hold off on cath as long as pt remains stable and renal function has time to recover. 3. HTN- stable 4. Acute pulm edema- improved   Kinslie Hove A

## 2011-11-23 NOTE — Progress Notes (Signed)
HPI:  65 year old African American male with long-standing history of hypertension presents to the emergency room today secondary to chest pain and acute shortness of breath. He had done fairly well until last evening he developed chest pain and then during the night became short of breath he had nausea and emesis, clammy to touch, extremely short of breath. In the emergency room he is in respiratory failure and was placed on BiPAP and 60 mg of IV Lasix was given as well as nebulizer treatments with significant interval improvement in symptoms. Patient has not been taking his HTN medications due to financial reasons. Cardiology admitted the patient and PCCM will consult regarding BiPAP and respiratory management.  Antibiotics:   None  Cultures/Sepsis Markers:   None  Access/Protocols:  PIV  BiPAP 12/29>>>12/30  Best Practice: DVT: Heparin drip. GI: Protonix  Subjective: Feels much better this AM.  Physical Exam: Filed Vitals:   11/23/11 1600  BP: 89/61  Pulse: 81  Temp: 99.7 F (37.6 C)  Resp: 13    Intake/Output Summary (Last 24 hours) at 11/23/11 1713 Last data filed at 11/23/11 1600  Gross per 24 hour  Intake 1195.36 ml  Output   4550 ml  Net -3354.64 ml   Vent Mode:  [-]  FiO2 (%):  [2 %] 2 %  Neuro: Alert and oriented, moving all ext. Cardiac: RRR, Nl S1/S2, -M/R/G. Pulmonary: Basilar crackles GI: Soft, NT, ND and +BS. Extremities: -edema and -tenderness.  Labs: CBC    Component Value Date/Time   WBC 13.2* 11/23/2011 0534   RBC 4.46 11/23/2011 0534   HGB 12.2* 11/23/2011 0534   HCT 36.3* 11/23/2011 0534   PLT 230 11/23/2011 0534   MCV 81.4 11/23/2011 0534   MCH 27.4 11/23/2011 0534   MCHC 33.6 11/23/2011 0534   RDW 15.9* 11/23/2011 0534    BMET    Component Value Date/Time   NA 139 11/23/2011 0534   K 4.1 11/23/2011 0534   CL 102 11/23/2011 0534   CO2 18* 11/23/2011 0534   GLUCOSE 115* 11/23/2011 0534   BUN 72* 11/23/2011 0534   CREATININE  3.45* 11/23/2011 0534   CALCIUM 8.9 11/23/2011 0534   GFRNONAA 17* 11/23/2011 0534   GFRAA 20* 11/23/2011 0534    Chest Xray: Persistent mild edema pattern  Assessment & Plan: Acute respiratory failure due to pulm edema - improved. Has not required BiPAP for > 24 hrs - D/C BiPAP from room -consider trasnfer to SDU or Tele  Acute on chronic renal insuff -will have to strike a delicate balance with regard to diuresis -Avoid nephrotoxins -Consider Renal consult   PCCM will sign off. Pelase call if we can be of further assistance   Merton Border, MD

## 2011-11-23 NOTE — Progress Notes (Signed)
ANTICOAGULATION CONSULT NOTE   Pharmacy Consult for Heparin Indication: ACS  No Known Allergies  Patient Measurements: Height: 5\' 5"  (165.1 cm) Weight: 161 lb 2.5 oz (73.1 kg) IBW/kg (Calculated) : 61.5   Vital Signs: Temp: 100 F (37.8 C) (12/31 2000) Temp src: Core (Comment) (12/31 2000) BP: 78/53 mmHg (12/31 2000) Pulse Rate: 94  (12/31 2000)  Labs:  Basename 11/23/11 1802 11/23/11 0534 11/22/11 0346 11/21/11 2357 11/21/11 1845 11/21/11 1131 11/21/11 0930 11/21/11 0855  HGB -- 12.2* 12.2* -- -- -- -- --  HCT -- 36.3* 36.0* -- -- -- 42.0 --  PLT -- 230 232 -- -- -- -- 233  APTT -- -- -- -- -- -- -- 23*  LABPROT -- -- -- -- -- -- -- 14.0  INR -- -- -- -- -- -- -- 1.06  HEPARINUNFRC 0.23* 0.20* 0.79* -- -- -- -- --  CREATININE -- 3.45* 3.25* 3.06* -- -- -- --  CKTOTAL -- -- 3618* -- 4005* 298* -- --  CKMB -- -- 328.5* -- 499.5* 19.1* -- --  TROPONINI -- -- >25.00* -- >25.00* 2.15* -- --   Estimated Creatinine Clearance: 18.6 ml/min (by C-G formula based on Cr of 3.45).  Assessment: 65 yo male with NSTEMI fopr Heparin.  Heparin level subtherapeutic this evening despite rate increase earlier today.  No bleeding noted per chart.      Goal of Therapy:  Heparin 0.3-0.7   Plan:  Will increase heparin to 1050 units/hr.  Will need repeat heparin level with AM labs to confirm therapeutic.  [place] Crane, Pharm D 11/23/2011 8:12 PM

## 2011-11-24 HISTORY — PX: CARDIAC CATHETERIZATION: SHX172

## 2011-11-24 LAB — RENAL FUNCTION PANEL
Albumin: 3.4 g/dL — ABNORMAL LOW (ref 3.5–5.2)
BUN: 78 mg/dL — ABNORMAL HIGH (ref 6–23)
CO2: 22 mEq/L (ref 19–32)
Chloride: 100 mEq/L (ref 96–112)
Creatinine, Ser: 3.46 mg/dL — ABNORMAL HIGH (ref 0.50–1.35)
Glucose, Bld: 126 mg/dL — ABNORMAL HIGH (ref 70–99)
Potassium: 4.3 mEq/L (ref 3.5–5.1)

## 2011-11-24 LAB — HEPARIN LEVEL (UNFRACTIONATED): Heparin Unfractionated: 0.23 IU/mL — ABNORMAL LOW (ref 0.30–0.70)

## 2011-11-24 MED ORDER — SODIUM CHLORIDE 0.9 % IJ SOLN: 3.0000 mL | INTRAMUSCULAR | Status: DC | PRN

## 2011-11-24 MED ORDER — HEPARIN (PORCINE) IN NACL 100-0.45 UNIT/ML-% IJ SOLN
1150.0000 [IU]/h | INTRAMUSCULAR | Status: DC
  Administered 2011-11-24 – 2011-11-26 (×3): 1150 [IU]/h via INTRAVENOUS
  Filled 2011-11-24 (×5): qty 250

## 2011-11-24 MED ORDER — ISOSORBIDE MONONITRATE ER 30 MG PO TB24
30.0000 mg | ORAL_TABLET | Freq: Every day | ORAL | Status: DC
  Administered 2011-11-24: 30 mg via ORAL
  Filled 2011-11-24 (×2): qty 1

## 2011-11-24 MED ORDER — SODIUM CHLORIDE 0.9 % IV SOLN: 250.0000 mL | INTRAVENOUS | Status: DC | PRN

## 2011-11-24 MED ORDER — SODIUM CHLORIDE 0.9 % IJ SOLN
3.0000 mL | Freq: Two times a day (BID) | INTRAMUSCULAR | Status: DC
Start: 2011-11-24 — End: 2011-11-30
  Administered 2011-11-24 – 2011-11-28 (×7): 3 mL via INTRAVENOUS
  Administered 2011-11-29: 23:00:00 via INTRAVENOUS
  Administered 2011-11-29: 3 mL via INTRAVENOUS

## 2011-11-24 NOTE — Progress Notes (Signed)
Patient ID: Francisco [name], male   DOB: [birth year], 66 y.o.   MRN: [id number] S:no complaints O:BP 120/85  Pulse 95  Temp(Src) 98.8 F (37.1 C) (Core (Comment))  Resp 17  Ht 5\' 5"  (1.651 m)  Wt 70.7 kg (155 lb 13.8 oz)  BMI 25.94 kg/m2  SpO2 98%  Intake/Output Summary (Last 24 hours) at 11/24/11 1121 Last data filed at 11/24/11 0900  Gross per 24 hour  Intake 1197.53 ml  Output   2561 ml  Net -1363.47 ml   Weight change: -2.4 kg (-5 lb 4.7 oz) Gen:wd wn aam in nad CVS:no rub Resp:cta b/l [id number] Ext:no c/c/e   Lab 11/24/11 0830 11/23/11 0534 11/22/11 0346 11/21/11 2357 11/21/11 1845 11/21/11 1142 11/21/11 0930 11/21/11 0855  NA 139 139 138 139 136 -- 143 138  K 4.3 4.1 5.3* 5.7* 6.4* -- 4.5 4.3  CL 100 102 107 108 107 -- 119* 105  CO2 22 18* 17* 16* 14* -- -- 17*  GLUCOSE 126* 115* 139* 132* 183* -- 256* 253*  BUN 78* 72* 61* 58* 53* -- 47* 40*  CREATININE 3.46* 3.45* 3.25* 3.06* 2.92* -- 2.20* 2.41*  ALB -- -- -- -- -- -- -- --  CALCIUM 9.3 8.9 9.0 9.0 8.5 -- -- 9.0  PHOS 5.6* 5.1* -- -- -- -- -- --  AST -- -- -- -- -- 62* -- 88*  ALT -- -- -- -- -- 38 -- 42   Liver Function Tests:  Lab 11/24/11 0830 11/21/11 1142 11/21/11 0855  AST -- 62* 88*  ALT -- 38 42  ALKPHOS -- 78 79  BILITOT -- 0.3 0.3  PROT -- 7.8 7.7  ALBUMIN 3.4* 3.0* 3.1*   No results found for this basename: LIPASE:3,AMYLASE:3 in the last 168 hours No results found for this basename: AMMONIA:3 in the last 168 hours CBC:  Lab 11/23/11 0534 11/22/11 0346 11/21/11 0930 11/21/11 0855  WBC 13.2* 14.8* -- 9.6  NEUTROABS -- -- -- --  HGB 12.2* 12.2* 14.3 --  HCT 36.3* 36.0* 42.0 --  MCV 81.4 81.8 -- 84.5  PLT 230 232 -- 233   Cardiac Enzymes:  Lab 11/22/11 0346 11/21/11 1845 11/21/11 1131 11/21/11 0916  CKTOTAL 3618* 4005* 298* --  CKMB 328.5* 499.5* 19.1* --  CKMBINDEX -- -- -- --  TROPONINI >25.00* >25.00* 2.15* 11.27*   CBG: No results found for this basename: GLUCAP:5 in the last 168  hours  Iron Studies:  Basename 11/23/11 0534  IRON 30*  TIBC 267  TRANSFERRIN --  FERRITIN 138   Studies/Results: US Renal  11/22/2011  *RADIOLOGY REPORT*  Clinical Data: Rule out hydronephrosis, increased creatinine  RENAL/URINARY TRACT ULTRASOUND  Technique: Renal ultrasound  Comparison:  07/14/2004  Findings:  The right kidney measures 10.2 cm in length.  There is cortical increased echogenicity.  No hydronephrosis.  Nonspecific somewhat linear increased echogenicity mid pole without definite shadowing to suggest a calcification.  Left kidney measures 9.7 cm in length.  No hydronephrosis or diagnostic renal calculus.  Again noted diffuse cortical increased echogenicity.  There is a Foley catheter in a decompressed urinary bladder.  Bilateral small pleural effusion.  IMPRESSION:  1.  Bilateral small pleural effusion. 2.  No hydronephrosis.  Bilateral renal cortical increased echogenicity consistent with chronic medical renal disease.  This was noted on prior exam. 3.  Nonspecific somewhat linear increased echogenicity mid pole of the right kidney measures about 1 cm.  There is no shadowing to suggest a calcification.  Original Report Authenticated By: Lahoma Crocker, M.D.   Dg Chest Port 1 View  11/23/2011  *RADIOLOGY REPORT*  Clinical Data: 66 year old male with respiratory failure.  PORTABLE CHEST - 1 VIEW  Comparison: 11/22/2011 and prior radiographs dating back to 07/14/2004  Findings: The cardiomediastinal silhouette is unremarkable. Improved aeration/edema is noted. Mild bibasilar atelectasis is again identified. There is no evidence of pneumothorax. Severe degenerative changes of both shoulders are present.  IMPRESSION: Improved aeration/edema.  Original Report Authenticated By: Lura Em, M.D.      . aspirin EC  81 mg Oral Daily  . carvedilol  6.25 mg Oral BID WC  . furosemide  80 mg Intravenous Q8H  . hydrALAZINE  37.5 mg Oral Q8H  . isosorbide mononitrate  30 mg Oral Daily  .  simvastatin  5 mg Oral q1800  . Ticagrelor  90 mg Oral BID  . DISCONTD: ipratropium  0.5 mg Nebulization Q6H  . DISCONTD: levalbuterol  0.63 mg Nebulization Q6H    BMET    Component Value Date/Time   NA 139 11/24/2011 0830   K 4.3 11/24/2011 0830   CL 100 11/24/2011 0830   CO2 22 11/24/2011 0830   GLUCOSE 126* 11/24/2011 0830   BUN 78* 11/24/2011 0830   CREATININE 3.46* 11/24/2011 0830   CALCIUM 9.3 11/24/2011 0830   GFRNONAA 17* 11/24/2011 0830   GFRAA 20* 11/24/2011 0830   CBC    Component Value Date/Time   WBC 13.2* 11/23/2011 0534   RBC 4.46 11/23/2011 0534   HGB 12.2* 11/23/2011 0534   HCT 36.3* 11/23/2011 0534   PLT 230 11/23/2011 0534   MCV 81.4 11/23/2011 0534   MCH 27.4 11/23/2011 0534   MCHC 33.6 11/23/2011 0534   RDW 15.9* 11/23/2011 0534     Assessment/Plan:  1. AKI/CKD- unkown baseline creat as last one was 1.4 5 years ago. Rate of rise has plateaued. Agee with cardiology to hold off on heart cath until creatinine has improved. Cont to follow.  Will hold lasix tonight in hopes tht creatinine will improve by am for cath   2. AMI- as above. Hold off on cath as long as pt remains stable and renal function has time to recover.  Would not proceed with cath until creat has started to decline.  Minimize contrast and gentle hydration with saline prior and post procedure when appropriate 3. HTN- stable 4. Acute pulm edema- improved 5. Disposition- tx to [facility]

## 2011-11-24 NOTE — Plan of Care (Signed)
Problem: Consults Goal: Cardiac Cath Patient Education (See Patient Education module for education specifics.) Outcome: Progressing Dr. Ellyn Hack spent approx 45 min talking with pt about need for heart cath and possibly defibrillator or life vest in the future before discharge. Also explained cause of decreased renal function and that heart cath may cause renal failure either chronic or acute.  This nurse re-iterated  And explained to wife and family the plan of care.  Will follow creatinine levels in am and MD's will make a decision in whether to do card. Cath tomorrow or wait another day or two.

## 2011-11-24 NOTE — Progress Notes (Addendum)
Subjective:  On nasal cannula, much improved.No chest pain.  Objective: Vital signs in last 24 hours: Temp:  [98.8 F (37.1 C)-100 F (37.8 C)] 98.8 F (37.1 C) (01/01 0400) Pulse Rate:  [69-107] 85  (01/01 0900) Resp:  [9-23] 17  (01/01 0900) BP: (78-127)/(37-85) 120/85 mmHg (01/01 0900) SpO2:  [96 %-100 %] 98 % (01/01 0900) FiO2 (%):  [2 %] 2 % (12/31 1215) Weight:  [70.7 kg (155 lb 13.8 oz)] 155 lb 13.8 oz (70.7 kg) (01/01 0600) Weight change: -2.4 kg (-5 lb 4.7 oz) Last BM Date: 11/23/11 Intake/Output from previous day: -4104 12/31 0701 - 01/01 0700 In: 1471.4 [P.O.:980; I.V.:475.4; IV Piggyback:16] Out: 2835 [Urine:2835] Intake/Output this shift: Total I/O In: 41 [I.V.:41] Out: -   General appearance: alert, cooperative, appears stated age, no distress and Looks much more comfortable Neck: no adenopathy, no carotid bruit, no JVD, supple, symmetrical, trachea midline and thyroid not enlarged, symmetric, no tenderness/mass/nodules Lungs: clear to auscultation bilaterally and non-labored Heart: regular rate and rhythm, S1: normal, S2: fixed splitting, no click, no rub and non-displaced PMI Abdomen: soft, non-tender; bowel sounds normal; no masses,  no organomegaly Extremities: extremities normal, atraumatic, no cyanosis or edema Pulses: 2+ and symmetric Skin: Skin color, texture, turgor normal. No rashes or lesions Neurologic: Alert and oriented X 3, normal strength and tone. Normal symmetric reflexes. Normal coordination and gait   looks & sounds much better!!   Lab Results:  Basename 11/23/11 0534 11/22/11 0346  WBC 13.2* 14.8*  HGB 12.2* 12.2*  HCT 36.3* 36.0*  PLT 230 232   BMET -    Basename 11/24/11 0830 11/23/11 0534  NA 139 139  K 4.3 4.1  CL 100 102  CO2 22 18*  GLUCOSE 126* 115*  BUN 78* 72*  CREATININE 3.46* 3.45*  CALCIUM 9.3 8.9    Basename 11/22/11 0346 11/21/11 1845  TROPONINI >25.00* >25.00*    Lab Results  Component Value Date   CHOL  263* 11/22/2011   HDL 72 11/22/2011   LDLCALC 180* 11/22/2011   TRIG 55 11/22/2011   CHOLHDL 3.7 11/22/2011   Lab Results  Component Value Date   HGBA1C 5.8* 11/21/2011     Lab Results  Component Value Date   TSH 1.985 11/21/2011    Hepatic Function Panel  Basename 11/24/11 0830 11/21/11 1142  PROT -- 7.8  ALBUMIN 3.4* --  AST -- 62*  ALT -- 38  ALKPHOS -- 78  BILITOT -- 0.3  BILIDIR -- <0.1  IBILI -- NOT CALCULATED    Basename 11/22/11 0346  CHOL 263*   No results found for this basename: PROTIME in the last 72 hours   EKG: SR 88,   Studies/Results: Reviewed   Medications: I have reviewed the patient's current medications.  Assessment/Plan: Principal Problem:  *Acute respiratory failure requiring BiPap - Secondary to Acute Diastolic (+/- Systolic) CHF in setting of NSTEMI Active Problems:  Acute pulmonary edema - secondary to ACS/NSTEMI with acute CHF   Renal failure, acute  Ischemic cardiomyopathy - Acute  NSTEMI (non-ST elevated myocardial infarction)  Alternating LBBB and RBBB  Tachycardia  HTN (hypertension) Ischemic cardiomyopathy. EF 30-35% Hyperglycemia - HgbA1C 5.8.  Key new complaints: Feels much better, on [place] examination changes: rales essentially gone, much less distress Key new findings / data: Cr stable!   I/Os noted. ECG with alternating bundle branch block is concerning.    PLAN: Will convert to PO Lasix today Continue with IV Heparin & PO Brilinta +  ASA for ACS -- If Cr level stabilize tomorrow, will plan on Diagnostic LHC  Without LVGram & minimal contrast (Dr. Gwenlyn Found). At this point we are >40 hrs out from his acute event, & has definite LAD WMA on Echo with EF ~30-35%, if LAD is occluded, would probably consider Cardiac MRI to evaluate for viability before attempting PCI based upon OAT trial results, however if not 100% occluded,would proceed with PCI.   If his angina or CHF Sx worsens, could proceed to cath with minimal contrast,  however, would prefer to wait. Appreciate Nephrology & PCCM input Continue Statin & change nitrate to PO. Carvedilol & Hydralazine  Will transfer to Stepdown - still critically ill with at least 2 organ system failure.  Gentle AM hydration for cath (will need to be seen in AM prior to cath with AML reviewed) -- would appreciate any thoughts pre-cath from Nephrology re: renal protection. --> No LV Gram; PCI only if critical.  I had a long talk with the patient re future plans including risks/benefits/complications of cath/PCI.  We also talked about the potential need for ICD in the future. Will plan for LifeVest on discharge.  CARDIAC CATH CONSENT:  Performing MD:  Jeanann Lewandowsky., M.D., M.S.  Procedure:  LEFT HEART CATHETERIZATION (WITHOUT LV GRAM) WITH CORONARY ANGIOGRAM POSSIBLE PERCUTANEOUS CORONARY INTERVENTION (STENT)  The procedure with Risks/Benefits/Alternatives and Indications was reviewed with the patient.  All questions were answered.    Risks / Complications include, but not limited to: Death, MI, CVA/TIA, VF/VT (with defibrillation), Bradycardia (need for temporary pacer placement), contrast induced nephropathy -- I stressed the increased risk of CIN with the potential of dialysis given current baseline renal insufficiency (will minimize contrast), bleeding / bruising / hematoma / pseudoaneurysm, vascular or coronary injury (with possible emergent CT or Vascular Surgery), adverse medication reactions, infection.    The patient voices understanding and agree to proceed.     Leonie Man, M.D., M.S. [facility] [address] [place], [place]  [address]  [phone]  11/24/2011 9:34 AM

## 2011-11-24 NOTE — Progress Notes (Signed)
ANTICOAGULATION CONSULT NOTE   Pharmacy Consult for Heparin Indication: ACS  No Known Allergies  Patient Measurements: Height: 5\' 5"  (165.1 cm) Weight: 155 lb 13.8 oz (70.7 kg) (standing scale) IBW/kg (Calculated) : 61.5   Vital Signs: BP: 108/67 mmHg (01/01 1425) Pulse Rate: 86  (01/01 1200)  Labs:  Basename 11/24/11 1514 11/24/11 0830 11/23/11 1802 11/23/11 0534 11/22/11 0346 11/21/11 1845  HGB -- -- -- 12.2* 12.2* --  HCT -- -- -- 36.3* 36.0* --  PLT -- -- -- 230 232 --  APTT -- -- -- -- -- --  LABPROT -- -- -- -- -- --  INR -- -- -- -- -- --  HEPARINUNFRC 0.23* 0.36 0.23* -- -- --  CREATININE -- 3.46* -- 3.45* 3.25* --  CKTOTAL -- -- -- -- 3618* 4005*  CKMB -- -- -- -- 328.5* 499.5*  TROPONINI -- -- -- -- >25.00* >25.00*   Estimated Creatinine Clearance: 18.5 ml/min (by C-G formula based on Cr of 3.46).  Assessment: 65 yoAAmale with NSTEMI for Heparin.  Heparin level dropped down to 0.23.  No bleeding noted per chart. Hgb stable. Note planned cardiac cath 01/02.          Goal of Therapy:  Heparin 0.3-0.7   Plan:  Increase heparin to 1150 units/hr F/u with 8 hr hep level

## 2011-11-24 NOTE — Progress Notes (Signed)
ANTICOAGULATION CONSULT NOTE   Pharmacy Consult for Heparin Indication: ACS  No Known Allergies  Patient Measurements: Height: 5\' 5"  (165.1 cm) Weight: 155 lb 13.8 oz (70.7 kg) (standing scale) IBW/kg (Calculated) : 61.5   Vital Signs: Temp: 98.8 F (37.1 C) (01/01 0400) Temp src: Core (Comment) (01/01 0400) BP: 120/85 mmHg (01/01 0900) Pulse Rate: 85  (01/01 0900)  Labs:  Basename 11/24/11 0830 11/23/11 1802 11/23/11 0534 11/22/11 0346 11/21/11 1845 11/21/11 1131 11/21/11 0930  HGB -- -- 12.2* 12.2* -- -- --  HCT -- -- 36.3* 36.0* -- -- 42.0  PLT -- -- 230 232 -- -- --  APTT -- -- -- -- -- -- --  LABPROT -- -- -- -- -- -- --  INR -- -- -- -- -- -- --  HEPARINUNFRC 0.36 0.23* 0.20* -- -- -- --  CREATININE 3.46* -- 3.45* 3.25* -- -- --  CKTOTAL -- -- -- 3618* 4005* 298* --  CKMB -- -- -- 328.5* 499.5* 19.1* --  TROPONINI -- -- -- >25.00* >25.00* 2.15* --   Estimated Creatinine Clearance: 18.5 ml/min (by C-G formula based on Cr of 3.46).  Assessment: 65 yoAAmale with NSTEMI for Heparin.  Heparin level therapeutic at 0.36.  No bleeding noted per chart. Hgb stable. Note planned cardiac cath 01/02.          Goal of Therapy:  Heparin 0.3-0.7   Plan:  Continue heparin at 1050 units/hr.  Repeat heparin level in 8 hours.

## 2011-11-24 NOTE — Plan of Care (Signed)
Problem: Phase I Progression Outcomes Goal: EF % per last Echo/documented,Core Reminder form on chart Outcome: Completed/Met Date Met:  11/24/11 35%

## 2011-11-25 ENCOUNTER — Encounter (HOSPITAL_COMMUNITY)
Admission: EM | Disposition: A | Discharge status: Home / Self Care | Payer: Self-pay | Source: Home / Self Care | Attending: Cardiovascular Disease

## 2011-11-25 ENCOUNTER — Inpatient Hospital Stay (HOSPITAL_COMMUNITY): Payer: Medicare Other

## 2011-11-25 DIAGNOSIS — I959 Hypotension, unspecified: Secondary | ICD-10-CM | POA: Diagnosis not present

## 2011-11-25 LAB — UIFE/LIGHT CHAINS/TP QN, 24-HR UR
Alpha 2, Urine: DETECTED — AB
Free Kappa Lt Chains,Ur: 1.6 mg/dL (ref 0.14–2.42)
Free Kappa/Lambda Ratio: 14.55 ratio — ABNORMAL HIGH (ref 2.04–10.37)
Free Lambda Lt Chains,Ur: 0.11 mg/dL (ref 0.02–0.67)
Total Protein, Urine: 5.6 mg/dL

## 2011-11-25 LAB — URINE MICROSCOPIC-ADD ON

## 2011-11-25 LAB — CBC
HCT: 33.9 % — ABNORMAL LOW (ref 39.0–52.0)
Hemoglobin: 11.5 g/dL — ABNORMAL LOW (ref 13.0–17.0)
MCHC: 33.9 g/dL (ref 30.0–36.0)
WBC: 8.8 10*3/uL (ref 4.0–10.5)

## 2011-11-25 LAB — RENAL FUNCTION PANEL
CO2: 23 mEq/L (ref 19–32)
Calcium: 8.6 mg/dL (ref 8.4–10.5)
Chloride: 97 mEq/L (ref 96–112)
GFR calc Af Amer: 18 mL/min — ABNORMAL LOW (ref >90)
GFR calc non Af Amer: 15 mL/min — ABNORMAL LOW (ref >90)
Glucose, Bld: 146 mg/dL — ABNORMAL HIGH (ref 70–99)
Potassium: 3.7 mEq/L (ref 3.5–5.1)
Sodium: 132 mEq/L — ABNORMAL LOW (ref 135–145)

## 2011-11-25 LAB — CREATININE, URINE, RANDOM: Creatinine, Urine: 153.37 mg/dL

## 2011-11-25 LAB — URINALYSIS, ROUTINE W REFLEX MICROSCOPIC
Bilirubin Urine: NEGATIVE
Nitrite: NEGATIVE
Specific Gravity, Urine: 1.012 (ref 1.005–1.030)
Urobilinogen, UA: 0.2 mg/dL (ref 0.0–1.0)
pH: 5 (ref 5.0–8.0)

## 2011-11-25 LAB — HEPARIN LEVEL (UNFRACTIONATED): Heparin Unfractionated: 0.67 IU/mL (ref 0.30–0.70)

## 2011-11-25 SURGERY — LEFT HEART CATHETERIZATION WITH CORONARY ANGIOGRAM
Anesthesia: LOCAL

## 2011-11-25 MED ORDER — SODIUM CHLORIDE 0.9 % IV SOLN
250.0000 mL | Freq: Once | INTRAVENOUS | Status: AC
  Administered 2011-11-25: 250 mL via INTRAVENOUS

## 2011-11-25 MED ORDER — SIMVASTATIN 20 MG PO TABS
20.0000 mg | ORAL_TABLET | Freq: Every day | ORAL | Status: DC
  Administered 2011-11-25 – 2011-11-26 (×2): 20 mg via ORAL
  Filled 2011-11-25 (×3): qty 1

## 2011-11-25 MED ORDER — ISOSORBIDE MONONITRATE ER 30 MG PO TB24
30.0000 mg | ORAL_TABLET | Freq: Every day | ORAL | Status: DC
  Administered 2011-11-26 – 2011-12-02 (×7): 30 mg via ORAL
  Filled 2011-11-25 (×8): qty 1

## 2011-11-25 MED ORDER — FUROSEMIDE 80 MG PO TABS
80.0000 mg | ORAL_TABLET | Freq: Two times a day (BID) | ORAL | Status: DC
  Administered 2011-11-25 – 2011-11-28 (×6): 80 mg via ORAL
  Filled 2011-11-25 (×8): qty 1

## 2011-11-25 NOTE — Progress Notes (Addendum)
24 hour urine collection begun.  Pt and family instructed to void in urinal and not discard any urine until test complete.  Pt and family verbalize understanding.  Had to restart 24 hour collection at 1300.

## 2011-11-25 NOTE — Progress Notes (Signed)
ANTICOAGULATION CONSULT NOTE   Pharmacy Consult for Heparin Indication: ACS  No Known Allergies  Patient Measurements: Height: 5\' 5"  (165.1 cm) Weight: 155 lb 13.8 oz (70.7 kg) (standing scale) IBW/kg (Calculated) : 61.5   Vital Signs: Temp: 98.1 F (36.7 C) (01/01 2323) Temp src: Oral (01/01 2323) BP: 87/62 mmHg (01/02 0159) Pulse Rate: 80  (01/02 0159)  Labs:  Basename 11/25/11 0115 11/24/11 1514 11/24/11 0830 11/23/11 0534 11/22/11 0346  HGB 11.5* -- -- 12.2* --  HCT 33.9* -- -- 36.3* 36.0*  PLT 220 -- -- 230 232  APTT -- -- -- -- --  LABPROT -- -- -- -- --  INR -- -- -- -- --  HEPARINUNFRC 0.67 0.23* 0.36 -- --  CREATININE 3.83* -- 3.46* 3.45* --  CKTOTAL -- -- -- -- 3618*  CKMB -- -- -- -- 328.5*  TROPONINI -- -- -- -- >25.00*   Estimated Creatinine Clearance: 16.7 ml/min (by C-G formula based on Cr of 3.83).  Assessment: 65 yoAAmale with NSTEMI for Heparin.        Goal of Therapy:  Heparin 0.3-0.7   Plan:  Continue Heparin at current rate Follow-up after cath.  Phillis Knack, PharmD, BCPS 11/25/2011 2:52 AM

## 2011-11-25 NOTE — Progress Notes (Signed)
Subjective: No chest pain, denies SOB.  Objective: Vital signs in last 24 hours: Temp:  [98.1 F (36.7 C)-98.2 F (36.8 C)] 98.1 F (36.7 C) (01/02 0812) Pulse Rate:  [73-100] 76  (01/02 0812) Resp:  [12-20] 19  (01/02 0812) BP: (84-127)/(45-90) 84/65 mmHg (01/02 0812) SpO2:  [95 %-100 %] 100 % (01/02 0812) Weight change:  Last BM Date: 11/24/11 Intake/Output from previous day:  -1365 01/01 0701 - 01/02 0700 In: 1367.8 [P.O.:840; I.V.:527.8] Out: 601 [Urine:600; Stool:1] Intake/Output this shift: Total I/O In: 261.5 [P.O.:240; I.V.:21.5] Out: -   PE: General:A&O X 3 MAE,Follows commands, pleasant affect. Heart:S1S2, RRR, no murmur, gallup, rub or click. Lungs:diminished 1/3 up with fine crackles. Abd:+ BS, soft, non tender.  Ext:No edema, + pedal pulses.   Lab Results:  [facility] 11/25/11 0115 11/23/11 0534  WBC 8.8 13.2*  HGB 11.5* 12.2*  HCT 33.9* 36.3*  PLT 220 230   BMET  Basename 11/25/11 0115 11/24/11 0830  NA 132* 139  K 3.7 4.3  CL 97 100  CO2 23 22  GLUCOSE 146* 126*  BUN 80* 78*  CREATININE 3.83* 3.46*  CALCIUM 8.6 9.3   No results found for this basename: TROPONINI:2,CK,MB:2 in the last 72 hours  Lab Results  Component Value Date   CHOL 263* 11/22/2011   HDL 72 11/22/2011   LDLCALC 180* 11/22/2011   TRIG 55 11/22/2011   CHOLHDL 3.7 11/22/2011   Lab Results  Component Value Date   HGBA1C 5.8* 11/21/2011     Lab Results  Component Value Date   TSH 1.985 11/21/2011    Hepatic Function Panel  Basename 11/25/11 0115  PROT --  ALBUMIN 2.7*  AST --  ALT --  ALKPHOS --  BILITOT --  BILIDIR --  IBILI --   No results found for this basename: CHOL in the last 72 hours No results found for this basename: PROTIME in the last 72 hours    EKG: Orders placed during the hospital encounter of 11/21/11  . ED EKG  . ED EKG  . EKG 12-LEAD  . EKG 12-LEAD  . EKG 12-LEAD  . EKG 12-LEAD  . EKG 12-LEAD  . EKG 12-LEAD  . EKG 12-LEAD  .  EKG 12-LEAD  . EKG 12-LEAD    Studies/Results: No results found.  Medications: I have reviewed the patient's current medications.  Assessment/Plan: Patient Active Problem List  Diagnoses  . Acute respiratory failure requiring BiPap - Secondary to Acute Diastolic (+/- Systolic) CHF in setting of NSTEMI  . Acute pulmonary edema - secondary to ACS/NSTEMI with acute CHF   . Tachycardia  . HTN (hypertension)  . NSTEMI (non-ST elevated myocardial infarction)  . Alternating LBBB and RBBB  . Renal failure, acute  . Ischemic cardiomyopathy - Acute  mild hypotension.  PLAN: With elevated Cr. Higher today, will hold off on cardiac cath. Tele- SR  BBB  Resume diet.  Hope to cath tomorrow. Decrease fluids now.  Diuretic held yesterday.   BP 84/65 to 97/61 Coreg 6.25 BID with parameters to hold for systolic [id number]. ? Reduce dose? Imdur 30 mg with order to hold for systolic < [id number]. Recheck PCXR.   On IV Heparin,  He is on list for transfer, ? Continue transfer with hypotension?  LOS: 4 days   INGOLD,LAURA R 11/25/2011, 9:05 AM  Agree with note written by Cecilie Kicks RNP  Chart reviewed. Events noted. Pt admitted 12/29 with CP and APE as well as HTN. Trop + >25. EKGs  showed initially LBBB then RBBB, and then LBBB again with signif anterior ST elevation c/w anterior wall MI. 2D shows LAD territory infarct with EF about 30-35%. Major issues now are low BP and rising SCr which preclude ability to cath. Renal following. Pt denies CP or SOB. Other labs OK. CXR clear. Exam benign with clear lungs, cor RRR and no periph edema. Will transfer to step down, fluid challenge with bolus of NS and infusion. Follow renal. If not able to cath will prob need lexican myoview prior to D/C to risk stratify. Keep on iv hep for now. On ASA and Brilenta.  Lorretta Harp 11/25/2011 11:26 AM

## 2011-11-25 NOTE — Progress Notes (Signed)
Patient ID: Francisco [name], male   DOB: [birth year], 66 y.o.   MRN: [id number] S:no new complaints O:BP 84/65  Pulse 76  Temp(Src) 98.1 F (36.7 C) (Oral)  Resp 19  Ht 5\' 5"  (1.651 m)  Wt 70.7 kg (155 lb 13.8 oz)  BMI 25.94 kg/m2  SpO2 98%  Intake/Output Summary (Last 24 hours) at 11/25/11 0947 Last data filed at 11/25/11 0900  Gross per 24 hour  Intake 1609.78 ml  Output    300 ml  Net 1309.78 ml   Weight change:  Gen:WD WN AAM in NAD CVS:RRR Resp:CTA [id number] Ext:no edema   Lab 11/25/11 0115 11/24/11 0830 11/23/11 0534 11/22/11 0346 11/21/11 2357 11/21/11 1845 11/21/11 1142 11/21/11 0930 11/21/11 0855  NA 132* 139 139 138 139 136 -- 143 --  K 3.7 4.3 4.1 5.3* 5.7* 6.4* -- 4.5 --  CL 97 100 102 107 108 107 -- 119* --  CO2 23 22 18* 17* 16* 14* -- -- 17*  GLUCOSE 146* 126* 115* 139* 132* 183* -- 256* --  BUN 80* 78* 72* 61* 58* 53* -- 47* --  CREATININE 3.83* 3.46* 3.45* 3.25* 3.06* 2.92* -- 2.20* --  ALB -- -- -- -- -- -- -- -- --  CALCIUM 8.6 9.3 8.9 9.0 9.0 8.5 -- -- 9.0  PHOS 5.4* 5.6* 5.1* -- -- -- -- -- --  AST -- -- -- -- -- -- 62* -- 88*  ALT -- -- -- -- -- -- 38 -- 42   Liver Function Tests:  Lab 11/25/11 0115 11/24/11 0830 11/21/11 1142 11/21/11 0855  AST -- -- 62* 88*  ALT -- -- 38 42  ALKPHOS -- -- 78 79  BILITOT -- -- 0.3 0.3  PROT -- -- 7.8 7.7  ALBUMIN 2.7* 3.4* 3.0* --   No results found for this basename: LIPASE:3,AMYLASE:3 in the last 168 hours No results found for this basename: AMMONIA:3 in the last 168 hours CBC:  Lab 11/25/11 0115 11/23/11 0534 11/22/11 0346 11/21/11 0855  WBC 8.8 13.2* 14.8* --  NEUTROABS -- -- -- --  HGB 11.5* 12.2* 12.2* --  HCT 33.9* 36.3* 36.0* --  MCV 80.9 81.4 81.8 84.5  PLT 220 230 232 --   Cardiac Enzymes:  Lab 11/22/11 0346 11/21/11 1845 11/21/11 1131 11/21/11 0916  CKTOTAL 3618* 4005* 298* --  CKMB 328.5* 499.5* 19.1* --  CKMBINDEX -- -- -- --  TROPONINI >25.00* >25.00* 2.15* 11.27*   CBG: No results  found for this basename: GLUCAP:5 in the last 168 hours  Iron Studies:  Basename 11/23/11 0534  IRON 30*  TIBC 267  TRANSFERRIN --  FERRITIN 138   Studies/Results: No results found.    Marland Kitchen aspirin EC  81 mg Oral Daily  . carvedilol  6.25 mg Oral BID WC  . hydrALAZINE  37.5 mg Oral Q8H  . isosorbide mononitrate  30 mg Oral Daily  . simvastatin  20 mg Oral q1800  . sodium chloride  3 mL Intravenous Q12H  . Ticagrelor  90 mg Oral BID  . DISCONTD: furosemide  80 mg Intravenous Q8H  . DISCONTD: simvastatin  5 mg Oral q1800    BMET    Component Value Date/Time   NA 132* 11/25/2011 0115   K 3.7 11/25/2011 0115   CL 97 11/25/2011 0115   CO2 23 11/25/2011 0115   GLUCOSE 146* 11/25/2011 0115   BUN 80* 11/25/2011 0115   CREATININE 3.83* 11/25/2011 0115   CALCIUM 8.6 11/25/2011 0115  GFRNONAA 15* 11/25/2011 0115   GFRAA 18* 11/25/2011 0115   CBC    Component Value Date/Time   WBC 8.8 11/25/2011 0115   RBC 4.19* 11/25/2011 0115   HGB 11.5* 11/25/2011 0115   HCT 33.9* 11/25/2011 0115   PLT 220 11/25/2011 0115   MCV 80.9 11/25/2011 0115   MCH 27.4 11/25/2011 0115   MCHC 33.9 11/25/2011 0115   RDW 15.3 11/25/2011 0115     Assessment/Plan:  1. AKI/CKD- unkown baseline creat as last one was 1.4 at least 5 years ago. Pt most likely has underlying CKD at least stage III.  Unfortunately his blood pressure has dropped and creatinine has increased despite holding diuretics and BP meds.  Agee with cardiology to hold off on heart cath until creatinine has improved. Cont to follow. Will cont to hold lasix and meds to keep SBP>100 and follow UOP and daily Scr.  Also, pt had high kappa/lambda UPEP on spot UA, as well as some hematuria and proteinuria.  Will order serologies and 24 hour UPEP to quantifiy.  2. Hypotension- this is new.  Hold/d/c meds per cardiology  3. AMI- as above. Hold off on cath as long as pt remains stable and renal function has time to recover. Would not proceed with cath until creat has started to  decline. Minimize contrast and gentle hydration with saline prior and post procedure when appropriate 4. HTN- stable 5. Acute pulm edema- improved 6. Disposition- cont to monitor closely with hypotension Rayanna Matusik A

## 2011-11-26 ENCOUNTER — Other Ambulatory Visit: Payer: Self-pay

## 2011-11-26 LAB — PRO B NATRIURETIC PEPTIDE: Pro B Natriuretic peptide (BNP): 2538 pg/mL — ABNORMAL HIGH (ref 0–125)

## 2011-11-26 LAB — PROTEIN ELECTROPHORESIS, SERUM
Alpha-1-Globulin: 6.3 % — ABNORMAL HIGH (ref 2.9–4.9)
Alpha-2-Globulin: 18.1 % — ABNORMAL HIGH (ref 7.1–11.8)
Beta 2: 5.5 % (ref 3.2–6.5)
Beta Globulin: 5.7 % (ref 4.7–7.2)
Gamma Globulin: 17.2 % (ref 11.1–18.8)
M-Spike, %: NOT DETECTED g/dL

## 2011-11-26 LAB — CBC
MCH: 27.5 pg (ref 26.0–34.0)
MCV: 82.6 fL (ref 78.0–100.0)
Platelets: 243 10*3/uL (ref 150–400)
RDW: 15.3 % (ref 11.5–15.5)
WBC: 8.8 10*3/uL (ref 4.0–10.5)

## 2011-11-26 LAB — COMPREHENSIVE METABOLIC PANEL
AST: 23 U/L (ref 0–37)
Albumin: 3.1 g/dL — ABNORMAL LOW (ref 3.5–5.2)
Calcium: 9.2 mg/dL (ref 8.4–10.5)
Creatinine, Ser: 3.31 mg/dL — ABNORMAL HIGH (ref 0.50–1.35)

## 2011-11-26 LAB — ANA: Anti Nuclear Antibody(ANA): NEGATIVE

## 2011-11-26 LAB — PROTEIN, URINE, 24 HOUR
Collection Interval-UPROT: 24 hours
Protein, 24H Urine: 285 mg/d — ABNORMAL HIGH (ref 50–100)
Urine Total Volume-UPROT: 2850 mL

## 2011-11-26 LAB — ANTI-DNA ANTIBODY, DOUBLE-STRANDED: ds DNA Ab: 1 IU/mL (ref <30)

## 2011-11-26 LAB — C4 COMPLEMENT: Complement C4, Body Fluid: 26 mg/dL (ref 10–40)

## 2011-11-26 MED ORDER — PANTOPRAZOLE SODIUM 40 MG PO TBEC
40.0000 mg | DELAYED_RELEASE_TABLET | Freq: Every day | ORAL | Status: DC
  Administered 2011-11-26 – 2011-12-03 (×8): 40 mg via ORAL
  Filled 2011-11-26 (×8): qty 1

## 2011-11-26 MED ORDER — HEPARIN SODIUM (PORCINE) 5000 UNIT/ML IJ SOLN
5000.0000 [IU] | Freq: Three times a day (TID) | INTRAMUSCULAR | Status: DC
  Administered 2011-11-26 – 2011-12-02 (×19): 5000 [IU] via SUBCUTANEOUS
  Filled 2011-11-26 (×24): qty 1

## 2011-11-26 NOTE — Progress Notes (Signed)
Patient ID: Francisco [name], male   DOB: 09/22/1946, 66 y.o.   MRN: [id number] S:feels good today O:BP 118/67  Pulse 83  Temp(Src) 98.2 F (36.8 C) (Oral)  Resp 18  Ht 5\' 5"  (1.651 m)  Wt 73.2 kg (161 lb 6 oz)  BMI 26.85 kg/m2  SpO2 100%  Intake/Output Summary (Last 24 hours) at 11/26/11 0950 Last data filed at 11/26/11 0900  Gross per 24 hour  Intake 2379.5 ml  Output   2625 ml  Net -245.5 ml   Weight change:  Gen:WD WN AAM in NAD  CVS:RRR  Resp:CTA  [id number]  Ext:no edema    Lab 11/26/11 0542 11/25/11 0115 11/24/11 0830 11/23/11 0534 11/22/11 0346 11/21/11 2357 11/21/11 1845 11/21/11 1142 11/21/11 0855  NA 139 132* 139 139 138 139 136 -- --  K 4.6 3.7 4.3 4.1 5.3* 5.7* 6.4* -- --  CL 103 97 100 102 107 108 107 -- --  CO2 21 23 22  18* 17* 16* 14* -- --  GLUCOSE 116* 146* 126* 115* 139* 132* 183* -- --  BUN 77* 80* 78* 72* 61* 58* 53* -- --  CREATININE 3.31* 3.83* 3.46* 3.45* 3.25* 3.06* 2.92* -- --  ALB -- -- -- -- -- -- -- -- --  CALCIUM 9.2 8.6 9.3 8.9 9.0 9.0 8.5 -- --  PHOS -- 5.4* 5.6* 5.1* -- -- -- -- --  AST 23 -- -- -- -- -- -- 62* 88*  ALT 18 -- -- -- -- -- -- 38 42   Liver Function Tests:  Lab 11/26/11 0542 11/25/11 0115 11/24/11 0830 11/21/11 1142 11/21/11 0855  AST 23 -- -- 62* 88*  ALT 18 -- -- 38 42  ALKPHOS 67 -- -- 78 79  BILITOT 0.5 -- -- 0.3 0.3  PROT 7.5 -- -- 7.8 7.7  ALBUMIN 3.1* 2.7* 3.4* -- --   No results found for this basename: LIPASE:3,AMYLASE:3 in the last 168 hours No results found for this basename: AMMONIA:3 in the last 168 hours CBC:  Lab 11/26/11 0542 11/25/11 0115 11/23/11 0534 11/22/11 0346 11/21/11 0855  WBC 8.8 8.8 13.2* -- --  NEUTROABS -- -- -- -- --  HGB 12.3* 11.5* 12.2* -- --  HCT 37.0* 33.9* 36.3* -- --  MCV 82.6 80.9 81.4 81.8 84.5  PLT 243 220 230 -- --   Cardiac Enzymes:  Lab 11/22/11 0346 11/21/11 1845 11/21/11 1131 11/21/11 0916  CKTOTAL 3618* 4005* 298* --  CKMB 328.5* 499.5* 19.1* --  CKMBINDEX -- --  -- --  TROPONINI >25.00* >25.00* 2.15* 11.27*   CBG: No results found for this basename: GLUCAP:5 in the last 168 hours  Iron Studies: No results found for this basename: IRON,TIBC,TRANSFERRIN,FERRITIN in the last 72 hours Studies/Results: Dg Chest Port 1 View  11/25/2011  *RADIOLOGY REPORT*  Clinical Data: Congestive heart failure.  High blood pressure.  PORTABLE CHEST - 1 VIEW  Comparison: 11/23/2011.  Findings: Interval improvement in degree of asymmetric air space disease.  No gross pneumothorax.  Heart size top normal.  Tortuous aorta.  Bilateral glenohumeral joint degenerative changes.  IMPRESSION: Interval improvement in degree of asymmetric air space disease.  Original Report Authenticated By: Doug Sou, M.D.      . sodium chloride 0.9% NICU IV bolus  250 mL Intravenous Once  . aspirin EC  81 mg Oral Daily  . carvedilol  6.25 mg Oral BID WC  . furosemide  80 mg Oral BID  . isosorbide mononitrate  30 mg  Oral Daily  . pantoprazole  40 mg Oral Q0600  . simvastatin  20 mg Oral q1800  . sodium chloride  3 mL Intravenous Q12H  . Ticagrelor  90 mg Oral BID  . DISCONTD: hydrALAZINE  37.5 mg Oral Q8H  . DISCONTD: isosorbide mononitrate  30 mg Oral Daily    BMET    Component Value Date/Time   NA 139 11/26/2011 0542   K 4.6 11/26/2011 0542   CL 103 11/26/2011 0542   CO2 21 11/26/2011 0542   GLUCOSE 116* 11/26/2011 0542   BUN 77* 11/26/2011 0542   CREATININE 3.31* 11/26/2011 0542   CALCIUM 9.2 11/26/2011 0542   GFRNONAA 18* 11/26/2011 0542   GFRAA 21* 11/26/2011 0542   CBC    Component Value Date/Time   WBC 8.8 11/26/2011 0542   RBC 4.48 11/26/2011 0542   HGB 12.3* 11/26/2011 0542   HCT 37.0* 11/26/2011 0542   PLT 243 11/26/2011 0542   MCV 82.6 11/26/2011 0542   MCH 27.5 11/26/2011 0542   MCHC 33.2 11/26/2011 0542   RDW 15.3 11/26/2011 0542     Assessment/Plan:  1. AKI/CKD- [unkown baseline creat as last one was 1.4 at least 5 years ago] Pt most likely has underlying CKD at least stage III.   Creatinine has finally declined off of lasix. Agee with cardiology to hold off on heart cath until creatinine has improved, possibly tomorrow. Cont to follow. Will cont to hold lasix and meds to keep SBP>100 and follow UOP and daily Scr. Also, pt had high kappa/lambda UPEP on spot UA, as well as some hematuria and proteinuria. Will order serologies and 24 hour UPEP to quantifiy.  2. Hypotension- this is new. Better today.  Hold/d/c meds per cardiology  3. AMI- as above.  Minimize contrast and gentle hydration with saline prior and post procedure when appropriate 4. HTN- stable/improved 5. Acute pulm edema- improved 6. Disposition- cont to monitor closely with hypotension 7.  [place] A

## 2011-11-26 NOTE — Progress Notes (Signed)
Subjective:  No chest or SOB  Objective:  Vital Signs in the last 24 hours: Temp:  [97.6 F (36.4 C)-98.8 F (37.1 C)] 98.2 F (36.8 C) (01/03 0731) Pulse Rate:  [71-86] 83  (01/03 0731) Resp:  [14-19] 18  (01/03 0400) BP: (97-128)/(67-84) 118/67 mmHg (01/03 0731) SpO2:  [97 %-100 %] 100 % (01/03 0731) Weight:  [73.2 kg (161 lb 6 oz)] 161 lb 6 oz (73.2 kg) (01/03 0500)  Intake/Output from previous day:  Intake/Output Summary (Last 24 hours) at 11/26/11 0817 Last data filed at 11/26/11 0800  Gross per 24 hour  Intake   2476 ml  Output   2625 ml  Net   -149 ml    Physical Exam: General appearance: alert, cooperative and no distress Lungs: clear to auscultation bilaterally Heart: regular rate and rhythm Abd: soft, nt nd, nabs Ext: no c/c/e, 2+ pulses   Rate: 82  Rhythm: normal sinus rhythm and LBBB  Lab Results:  Basename 11/26/11 0542 11/25/11 0115  WBC 8.8 8.8  HGB 12.3* 11.5*  PLT 243 220    Basename 11/26/11 0542 11/25/11 0115  NA 139 132*  K 4.6 3.7  CL 103 97  CO2 21 23  GLUCOSE 116* 146*  BUN 77* 80*  CREATININE 3.31* 3.83*   No results found for this basename: TROPONINI:2,CK,MB:2 in the last 72 hours Hepatic Function Panel  Basename 11/26/11 0542  PROT 7.5  ALBUMIN 3.1*  AST 23  ALT 18  ALKPHOS 67  BILITOT 0.5  BILIDIR --  IBILI --   No results found for this basename: CHOL in the last 72 hours No results found for this basename: INR in the last 72 hours  Imaging: Dg Chest Port 1 View  11/25/2011  *RADIOLOGY REPORT*  Clinical Data: Congestive heart failure.  High blood pressure.  PORTABLE CHEST - 1 VIEW  Comparison: 11/23/2011.  Findings: Interval improvement in degree of asymmetric air space disease.  No gross pneumothorax.  Heart size top normal.  Tortuous aorta.  Bilateral glenohumeral joint degenerative changes.  IMPRESSION: Interval improvement in degree of asymmetric air space disease.  Original Report Authenticated By: Doug Sou,  M.D.    Cardiac Studies: ECG: NSR 73, LBBB / IVCD  Assessment/Plan:   Principal Problem:  * Acute respiratory failure requiring BiPap - Secondary to Acute Diastolic (+/- Systolic) CHF in setting of NSTEMI - resp failure resolved * NSTEMI (non-ST elevated myocardial infarction),acute MI Active Problems:  Acute pulmonary edema - secondary to ACS/NSTEMI with acute CHF , BNP decreasing,   Ischemic cardiomyopathy - Acute, EF 30-35%  Tachycardia - resolving, likely compensatory  HTN (hypertension) - stable  Alternating LBBB and RBBB  Renal failure, acute, Cr improving today, Nephrology following, studies ordered.   Temporary Hypotension, improved, holding off med titration.   Plan-No Ace or ARB. Possibly transfer to telemetry tomorrow if tolerates OOB-chair & ambulating around room today.   Kerin Ransom PA-C 11/26/2011, 8:17 AM  I have seen & examined Mr. Rita this AM and agree with Koren Bound note with my attestation (in bold italics)   - Can d/c IV Heparin (now day 6) - change to DVT prophylaxis - Will hold off on invasive evaluation until renal function stabilizes.  I still think that given the extent of infarction, known his coronary anatomy is important.  A viability study would be helpful as an adjunctive study.   - He says he feels well, so we will try to advance his activity level.  If  he does well with this, can transfer to Telemetry tomorrow.  Leonie Man, M.D., M.S. [facility] [address] [place], [place]  [address]  [phone]  11/26/2011 8:36 AM

## 2011-11-26 NOTE — Progress Notes (Signed)
   CARE MANAGEMENT NOTE 11/26/2011  Patient:  Francisco [name], Francisco [name]   Account Number:  [id number]  Date Initiated:  11/26/2011  Documentation initiated by:  GRAVES-BIGELOW,Demetria Iwai  Subjective/Objective Assessment:   Pt in with CP. Hx htn. Creatinine has finally declined. Plan to hold off on heart cath until creatinine has improved.     Action/Plan:   Anticipated DC Date:  11/30/2011   Anticipated DC Plan:  [place]  CM consult      Choice offered to / List presented to:             Status of service:  In process, will continue to follow Medicare Important Message given?   (If response is "NO", the following Medicare IM given date fields will be blank) Date Medicare IM given:   Date Additional Medicare IM given:    Discharge Disposition:    Per UR Regulation:    Comments:  11-26-11 1646 Jacqlyn Krauss, RN,BSN [phone] CM will continue to moniotr for d/c disposition.

## 2011-11-27 ENCOUNTER — Inpatient Hospital Stay (HOSPITAL_COMMUNITY): Payer: Medicare Other

## 2011-11-27 DIAGNOSIS — E785 Hyperlipidemia, unspecified: Secondary | ICD-10-CM | POA: Diagnosis present

## 2011-11-27 HISTORY — DX: Hyperlipidemia, unspecified: E78.5

## 2011-11-27 LAB — BASIC METABOLIC PANEL
BUN: 67 mg/dL — ABNORMAL HIGH (ref 6–23)
CO2: 21 mEq/L (ref 19–32)
Calcium: 7.6 mg/dL — ABNORMAL LOW (ref 8.4–10.5)
Chloride: 106 mEq/L (ref 96–112)
Creatinine, Ser: 2.91 mg/dL — ABNORMAL HIGH (ref 0.50–1.35)
GFR calc Af Amer: 25 mL/min — ABNORMAL LOW (ref >90)
GFR calc non Af Amer: 21 mL/min — ABNORMAL LOW (ref >90)
Glucose, Bld: 104 mg/dL — ABNORMAL HIGH (ref 70–99)
Potassium: 3.6 mEq/L (ref 3.5–5.1)
Sodium: 138 mEq/L (ref 135–145)

## 2011-11-27 LAB — CULTURE, BLOOD (ROUTINE X 2)

## 2011-11-27 LAB — UIFE/LIGHT CHAINS/TP QN, 24-HR UR
Albumin, U: DETECTED
Alpha 2, Urine: DETECTED — AB
Beta, Urine: DETECTED — AB
Gamma Globulin, Urine: DETECTED — AB

## 2011-11-27 LAB — CBC
HCT: 32.6 % — ABNORMAL LOW (ref 39.0–52.0)
Hemoglobin: 10.7 g/dL — ABNORMAL LOW (ref 13.0–17.0)
RBC: 3.97 MIL/uL — ABNORMAL LOW (ref 4.22–5.81)
WBC: 7.6 10*3/uL (ref 4.0–10.5)

## 2011-11-27 MED ORDER — ROSUVASTATIN CALCIUM 20 MG PO TABS
20.0000 mg | ORAL_TABLET | Freq: Every day | ORAL | Status: DC
  Administered 2011-11-27 – 2011-12-02 (×6): 20 mg via ORAL
  Filled 2011-11-27 (×8): qty 1

## 2011-11-27 MED ORDER — TECHNETIUM TC 99M TETROFOSMIN IV KIT
30.0000 | PACK | Freq: Once | INTRAVENOUS | Status: AC | PRN
  Administered 2011-11-27: 30 via INTRAVENOUS

## 2011-11-27 MED ORDER — TECHNETIUM TC 99M TETROFOSMIN IV KIT
10.0000 | PACK | Freq: Once | INTRAVENOUS | Status: AC | PRN
  Administered 2011-11-27: 10 via INTRAVENOUS

## 2011-11-27 MED ORDER — REGADENOSON 0.4 MG/5ML IV SOLN
0.4000 mg | Freq: Once | INTRAVENOUS | Status: AC
  Administered 2011-11-27: 0.4 mg via INTRAVENOUS

## 2011-11-27 MED ORDER — SODIUM CHLORIDE 0.9 % IV BOLUS (SEPSIS): 200.0000 mL | Freq: Once | INTRAVENOUS | Status: DC

## 2011-11-27 NOTE — Progress Notes (Signed)
Clinical Social Worker referred lifevest need and medication assistance to Case Management. CSW will signoff for now as social work intervention is no longer needed. Please consult [place] again should new needs arise.    Leandro Reasoner MSW, [place]

## 2011-11-27 NOTE — Progress Notes (Signed)
Transferred to 2900. With transport and self monitored. Report given to [facility].

## 2011-11-27 NOTE — Progress Notes (Signed)
200cc NS infused and IV NS back at 50cc per hour.

## 2011-11-27 NOTE — Progress Notes (Addendum)
At [place] given with L. Rosalyn Gess PA present. At 1148 complaints of SOB and the machine was unable to record BP. At 1151 patient became unresponsive, pale, diaphoretic, heart rate 70's with sinus rhythm, spontaneous regular  resps at 20. Marland Kitchen Patient had faint but palpable radial pulse. Head of bed which was elevated around 20 degrees flattened and legs elevated. Fluid bolus was started at 999cc per hour for 261mls as ordered per L Kilroy PA. Patient rouseable by [id number] and complaints of feeling bad and mid upper abd pain. Color slowly improving. Remains diaphoretic. The auto BP machine used for stress tests unable to obtain BP so changed over to dinamap and BP at 1152 was 115/63. Radial pulse much stronger than when unresponsive. Unable to obtain sp02 as fingers are cool.

## 2011-11-27 NOTE — Progress Notes (Signed)
Patient ID: Francisco [name], male   DOB: 04/06/1946, 66 y.o.   MRN: [id number] S:"feel like my old self again" O:BP 135/82  Pulse 79  Temp(Src) 97.8 F (36.6 C) (Oral)  Resp 18  Ht 5\' 5"  (1.651 m)  Wt 72.1 kg (158 lb 15.2 oz)  BMI 26.45 kg/m2  SpO2 97%  Intake/Output Summary (Last 24 hours) at 11/27/11 0820 Last data filed at 11/27/11 0800  Gross per 24 hour  Intake   1966 ml  Output   1900 ml  Net     66 ml   Weight change: -1.1 kg (-2 lb 6.8 oz) Gen:WD WN AAM in NAD CVS:RRR Resp:CTA [id number] Ext:no edema   Lab 11/27/11 0520 11/26/11 0542 11/25/11 0115 11/24/11 0830 11/23/11 0534 11/22/11 0346 11/21/11 2357 11/21/11 1142 11/21/11 0855  NA 138 139 132* 139 139 138 139 -- --  K 3.6 4.6 3.7 4.3 4.1 5.3* 5.7* -- --  CL 106 103 97 100 102 107 108 -- --  CO2 21 21 23 22  18* 17* 16* -- --  GLUCOSE 104* 116* 146* 126* 115* 139* 132* -- --  BUN 67* 77* 80* 78* 72* 61* 58* -- --  CREATININE 2.91* 3.31* 3.83* 3.46* 3.45* 3.25* 3.06* -- --  ALB -- -- -- -- -- -- -- -- --  CALCIUM 7.6* 9.2 8.6 9.3 8.9 9.0 9.0 -- --  PHOS -- -- 5.4* 5.6* 5.1* -- -- -- --  AST -- 23 -- -- -- -- -- 62* 88*  ALT -- 18 -- -- -- -- -- 38 42   Liver Function Tests:  Lab 11/26/11 0542 11/25/11 0115 11/24/11 0830 11/21/11 1142 11/21/11 0855  AST 23 -- -- 62* 88*  ALT 18 -- -- 38 42  ALKPHOS 67 -- -- 78 79  BILITOT 0.5 -- -- 0.3 0.3  PROT 7.5 -- -- 7.8 7.7  ALBUMIN 3.1* 2.7* 3.4* -- --   No results found for this basename: LIPASE:3,AMYLASE:3 in the last 168 hours No results found for this basename: AMMONIA:3 in the last 168 hours CBC:  Lab 11/27/11 0520 11/26/11 0542 11/25/11 0115 11/23/11 0534 11/22/11 0346  WBC 7.6 8.8 8.8 -- --  NEUTROABS -- -- -- -- --  HGB 10.7* 12.3* 11.5* -- --  HCT 32.6* 37.0* 33.9* -- --  MCV 82.1 82.6 80.9 81.4 81.8  PLT 224 243 220 -- --   Cardiac Enzymes:  Lab 11/22/11 0346 11/21/11 1845 11/21/11 1131 11/21/11 0916  CKTOTAL 3618* 4005* 298* --  CKMB 328.5* 499.5*  19.1* --  CKMBINDEX -- -- -- --  TROPONINI >25.00* >25.00* 2.15* 11.27*   CBG: No results found for this basename: GLUCAP:5 in the last 168 hours  Iron Studies: No results found for this basename: IRON,TIBC,TRANSFERRIN,FERRITIN in the last 72 hours Studies/Results: Dg Chest Port 1 View  11/25/2011  *RADIOLOGY REPORT*  Clinical Data: Congestive heart failure.  High blood pressure.  PORTABLE CHEST - 1 VIEW  Comparison: 11/23/2011.  Findings: Interval improvement in degree of asymmetric air space disease.  No gross pneumothorax.  Heart size top normal.  Tortuous aorta.  Bilateral glenohumeral joint degenerative changes.  IMPRESSION: Interval improvement in degree of asymmetric air space disease.  Original Report Authenticated By: Doug Sou, M.D.      . aspirin EC  81 mg Oral Daily  . carvedilol  6.25 mg Oral BID WC  . furosemide  80 mg Oral BID  . heparin subcutaneous  5,000 Units Subcutaneous Q8H  .  isosorbide mononitrate  30 mg Oral Daily  . pantoprazole  40 mg Oral Q0600  . simvastatin  20 mg Oral q1800  . sodium chloride  3 mL Intravenous Q12H  . Ticagrelor  90 mg Oral BID    BMET    Component Value Date/Time   NA 138 11/27/2011 0520   K 3.6 11/27/2011 0520   CL 106 11/27/2011 0520   CO2 21 11/27/2011 0520   GLUCOSE 104* 11/27/2011 0520   BUN 67* 11/27/2011 0520   CREATININE 2.91* 11/27/2011 0520   CALCIUM 7.6* 11/27/2011 0520   GFRNONAA 21* 11/27/2011 0520   GFRAA 25* 11/27/2011 0520   CBC    Component Value Date/Time   WBC 7.6 11/27/2011 0520   RBC 3.97* 11/27/2011 0520   HGB 10.7* 11/27/2011 0520   HCT 32.6* 11/27/2011 0520   PLT 224 11/27/2011 0520   MCV 82.1 11/27/2011 0520   MCH 27.0 11/27/2011 0520   MCHC 32.8 11/27/2011 0520   RDW 15.2 11/27/2011 0520     Assessment/Plan:  1. AKI/CKD- [unkown baseline creat as last one was 1.4 at least 5 years ago] Pt most likely has underlying CKD at least stage III. Creatinine has finally declined off of lasix. Agee with cardiology to hold off on  heart cath until creatinine has improved, possibly tomorrow. Cont to follow. Will cont to hold lasix and meds to keep SBP>100 and follow UOP and daily Scr. Also, pt had high kappa/lambda UPEP on spot UA, as well as some hematuria and proteinuria. Will order serologies and 24 hour UPEP to quantifiy.  2. Hypotension- this is new. Better today. Hold/d/c meds per cardiology  3. AMI- as above. Minimize contrast and gentle hydration with saline prior and post procedure when appropriate.  Agree with stress test first to establish if there is viable/at risk tissue vs. Scar before administering nephrotoxic contrast 4. HTN- stable/improved Acute pulm edema- improved  5. Anemia- new.  Cont to follow 6. Disposition- cont to monitor closely with hypotension  Braeleigh Pyper A

## 2011-11-27 NOTE — Progress Notes (Signed)
Patient remains stable with good color, skin warm and dry and no further complaints of pain. Will keep NPO for now. VS stable and remains in sinus rhythm. Voided 200 ml clear yellow urine.

## 2011-11-27 NOTE — Progress Notes (Signed)
Heart rate during lexiscan stress test remained from 67 to 83, sinus rhythm. Now is pink and warm and skin now dry. To be monitored for rest of exam as per orders L Carrier PA. Still some abd pain but less. BP 108/82 with HOB elevated 20 degrees.

## 2011-11-27 NOTE — Progress Notes (Signed)
Pt became profoundly hypotensive and diaphoretic after stress test, c/o epigastric pain but no chest pain. He responded to IVF and is currently stable. At this point will try and get stress images and review later with MD.  Kerin Ransom PA-C 11/27/2011 12:06 PM

## 2011-11-27 NOTE — Progress Notes (Signed)
Opened in error

## 2011-11-27 NOTE — Progress Notes (Addendum)
[facility]  Subjective: No CP or SOB.  Patient stated, "I feel back to my old self."  Objective: Vital signs in last 24 hours: Temp:  [97.5 F (36.4 C)-98.2 F (36.8 C)] 97.5 F (36.4 C) (01/04 0400) Pulse Rate:  [74-83] 75  (01/03 1628) Resp:  [16-20] 16  (01/04 0400) BP: (94-118)/(66-78) 108/75 mmHg (01/04 0321) SpO2:  [95 %-100 %] 99 % (01/04 0400) Weight:  [72.1 kg (158 lb 15.2 oz)] 158 lb 15.2 oz (72.1 kg) (01/04 0500) Last BM Date: 11/25/11  Intake/Output from previous day: 01/03 0701 - 01/04 0700 In: 1927.5 [P.O.:840; I.V.:1087.5] Out: 1925 [Urine:1925] Intake/Output this shift: Total I/O In: 573 [P.O.:120; I.V.:453] Out: 800 [Urine:800]  Medications Current Facility-Administered Medications  Medication Dose Route Frequency Provider Last Rate Last Dose  . 0.9 %  sodium chloride infusion   Intravenous Continuous Francisco Serge, Francisco [name] 50 mL/hr at 11/25/11 1133 50 mL at 11/25/11 1133  . acetaminophen (TYLENOL) tablet 650 mg  650 mg Oral Q4H PRN Francisco Serge, Francisco [name]      . aspirin EC tablet 81 mg  81 mg Oral Daily Baruch Gouty, PHARMD   81 mg at 11/26/11 1118  . carvedilol (COREG) tablet 6.25 mg  6.25 mg Oral BID WC Leonie Green Harding   6.25 mg at 11/26/11 1700  . furosemide (LASIX) tablet 80 mg  80 mg Oral BID Leonie Man   80 mg at 11/26/11 1800  . heparin injection 5,000 Units  5,000 Units Subcutaneous Q8H Doree Fudge, MD   5,000 Units at 11/27/11 0523  . isosorbide mononitrate (IMDUR) 24 hr tablet 30 mg  30 mg Oral Daily Donetta Potts, MD   30 mg at 11/26/11 1118  . morphine 2 MG/ML injection 2 mg  2 mg Intravenous Q2H PRN Francisco Serge, Francisco [name]   2 mg at 11/21/11 1600  . nitroGLYCERIN (NITROSTAT) SL tablet 0.4 mg  0.4 mg Sublingual Q5 min PRN Francisco Serge, Francisco [name]      . ondansetron [facility] injection 4 mg  4 mg Intravenous Q6H PRN Francisco Serge, Francisco [name]      . pantoprazole (PROTONIX) EC tablet 40 mg  40 mg Oral Q0600 Doreene Burke [place],  [place]   40 mg at 11/27/11 0523  . simvastatin (ZOCOR) tablet 20 mg  20 mg Oral q1800 Francisco Serge, Francisco [name]   20 mg at 11/26/11 1800  . sodium chloride 0.9 % injection 3 mL  3 mL Intravenous PRN Troy Sine, MD   3 mL at 11/23/11 1928  . sodium chloride 0.9 % injection 3 mL  3 mL Intravenous Q12H Francisco Serge, Francisco [name]   3 mL at 11/26/11 2127  . sodium chloride 0.9 % injection 3 mL  3 mL Intravenous PRN Francisco Serge, Francisco [name]      . Ticagrelor (BRILINTA) tablet 90 mg  90 mg Oral BID Leonie Man   90 mg at 11/26/11 2128  . zolpidem (AMBIEN) tablet 5 mg  5 mg Oral QHS PRN Francisco Serge, Francisco [name]      . DISCONTD: heparin ADULT infusion 100 units/mL (25000 units/250 mL)  1,150 Units/hr Intravenous Continuous Troy Sine, MD 11.5 mL/hr at 11/26/11 0030 1,150 Units/hr at 11/26/11 0030    PE: General appearance: alert, cooperative and no distress Lungs: clear to auscultation bilaterally Heart: regular rate and rhythm, S1, S2 normal, no murmur, click, rub or gallop Extremities: No LEE  Pulses: 2+ radials  and DP pulses  Lab Results:   Basename 11/27/11 0520 11/26/11 0542 11/25/11 0115  WBC 7.6 8.8 8.8  HGB 10.7* 12.3* 11.5*  HCT 32.6* 37.0* 33.9*  PLT 224 243 220   BMET  Basename 11/27/11 0520 11/26/11 0542 11/25/11 0115  NA 138 139 132*  K 3.6 4.6 3.7  CL 106 103 97  CO2 21 21 23   GLUCOSE 104* 116* 146*  BUN 67* 77* 80*  CREATININE 2.91* 3.31* 3.83*  CALCIUM 7.6* 9.2 8.6    Assessment/Plan  Principal Problem:  *Acute respiratory failure requiring BiPap - Secondary to Acute Diastolic (+/- Systolic) CHF in setting of NSTEMI Active Problems:  Acute pulmonary edema - secondary to ACS/NSTEMI with acute CHF   Tachycardia  HTN (hypertension)  NSTEMI (non-ST elevated myocardial infarction),acute MI  Alternating LBBB and RBBB  Renal failure, acute  Ischemic cardiomyopathy - Acute  Hypotension  Plan:  Creatinine has made some improvement.  Should order Lexiscan for tomorrow in light of  elevated CR.  Trop + >25. EKGs showed initially LBBB then RBBB, and then LBBB again with significant anterior ST elevation c/[name] anterior wall MI. 2D shows LAD territory infarct with EF about 30-35%.  Renal following. Hgb decreased to 10.7 from 12.3.  Continue to follow.   LOS: 6 days    Francisco [name],Francisco [name] 11/27/2011 6:24 AM   Agree with note written by Francisco [name] PAC  No CP/SOB. Exam benign. VSS. SCr is lowly improving. Renal following. Plan lexiscan myoview today or tomorrow to risk stratify and help determine whether pt requires cath versus conservative medical therapy. Will prob also need 2D echo prior to D/C to help decide re life vest..  Lorretta Harp 11/27/2011 8:22 AM

## 2011-11-28 LAB — BASIC METABOLIC PANEL
BUN: 74 mg/dL — ABNORMAL HIGH (ref 6–23)
GFR calc non Af Amer: 19 mL/min — ABNORMAL LOW (ref >90)
Glucose, Bld: 117 mg/dL — ABNORMAL HIGH (ref 70–99)
Potassium: 4.1 mEq/L (ref 3.5–5.1)

## 2011-11-28 LAB — CBC
HCT: 35.1 % — ABNORMAL LOW (ref 39.0–52.0)
Hemoglobin: 11.8 g/dL — ABNORMAL LOW (ref 13.0–17.0)
MCHC: 33.6 g/dL (ref 30.0–36.0)

## 2011-11-28 LAB — COMPLEMENT, TOTAL: Compl, Total (CH50): >60 U/mL — ABNORMAL HIGH (ref 31–60)

## 2011-11-28 MED ORDER — FUROSEMIDE 40 MG PO TABS: 40.0000 mg | ORAL_TABLET | Freq: Two times a day (BID) | ORAL | Status: DC

## 2011-11-28 NOTE — Progress Notes (Signed)
Subjective: Symptomatic during stress test.  Results showed low EF, wall motion abnormalities and no ischemia.  No breathing problems or CP today.    Objective Vital signs in last 24 hours: Filed Vitals:   11/28/11 0000 11/28/11 0400 11/28/11 0500 11/28/11 0738  BP: 109/60 115/73    Pulse: 73 68    Temp: 98 F (36.7 C) 98.3 F (36.8 C)  97.5 F (36.4 C)  TempSrc: Oral Oral  Oral  Resp: 20 22    Height:      Weight:   72.1 kg (158 lb 15.2 oz)   SpO2: 97% 98%     Weight change: 0 kg (0 lb)  Intake/Output Summary (Last 24 hours) at 11/28/11 0840 Last data filed at 11/28/11 0800  Gross per 24 hour  Intake   1773 ml  Output   2700 ml  Net   -927 ml   Labs: Basic Metabolic Panel:  Lab [id number] 0620 11/27/11 0520 11/26/11 0542 11/25/11 0115 11/24/11 0830 11/23/11 0534 11/22/11 0346  NA 137 138 139 132* 139 139 138  K 4.1 3.6 4.6 3.7 4.3 4.1 5.3*  CL 100 106 103 97 100 102 107  CO2 22 21 21 23 22  18* 17*  GLUCOSE 117* 104* 116* 146* 126* 115* 139*  BUN 74* 67* 77* 80* 78* 72* 61*  CREATININE 3.25* 2.91* 3.31* 3.83* 3.46* 3.45* 3.25*  ALB -- -- -- -- -- -- --  CALCIUM 9.1 7.6* 9.2 8.6 9.3 8.9 9.0  PHOS -- -- -- 5.4* 5.6* 5.1* --   Liver Function Tests:  Lab 11/26/11 0542 11/25/11 0115 11/24/11 0830 11/21/11 1142 11/21/11 0855  AST 23 -- -- 62* 88*  ALT 18 -- -- 38 42  ALKPHOS 67 -- -- 78 79  BILITOT 0.5 -- -- 0.3 0.3  PROT 7.5 -- -- 7.8 7.7  ALBUMIN 3.1* 2.7* 3.4* -- --   No results found for this basename: LIPASE:3,AMYLASE:3 in the last 168 hours No results found for this basename: AMMONIA:3 in the last 168 hours CBC:  Lab 11/28/11 0620 11/27/11 0520 11/26/11 0542 11/25/11 0115  WBC 9.0 7.6 8.8 8.8  NEUTROABS -- -- -- --  HGB 11.8* 10.7* 12.3* 11.5*  HCT 35.1* 32.6* 37.0* 33.9*  MCV 81.8 82.1 82.6 80.9  PLT 246 224 243 220   PT/INR: @labrcntip (inr:5) Cardiac Enzymes:  Lab 11/22/11 0346 11/21/11 1845 11/21/11 1131 11/21/11 0916  CKTOTAL 3618* 4005* 298* --    CKMB 328.5* 499.5* 19.1* --  CKMBINDEX -- -- -- --  TROPONINI >25.00* >25.00* 2.15* 11.27*   CBG: No results found for this basename: GLUCAP:5 in the last 168 hours  Iron Studies:  Lab 11/23/11 0534  IRON 30*  TIBC 267  TRANSFERRIN --  FERRITIN 138   Studies/Results: Nm Myocar Multi W/spect W/wall Motion / Ef  11/27/2011  *RADIOLOGY REPORT*  Clinical Data:  Myocardial infarction.  MYOCARDIAL IMAGING WITH SPECT (REST AND PHARMACOLOGIC-STRESS) GATED LEFT VENTRICULAR WALL MOTION STUDY LEFT VENTRICULAR EJECTION FRACTION  Technique:  Standard myocardial SPECT imaging was performed after resting intravenous injection of 10 mCi Tc-75m tetrofosmin. Subsequently, intravenous infusion of lexiscan was performed under the supervision of the Cardiology staff.  At peak effect of the drug, 30 mCi Tc-35m tetrofosmin was injected intravenously and standard myocardial SPECT  imaging was performed.  Quantitative gated imaging was also performed to evaluate left ventricular wall motion, and estimate left ventricular ejection fraction.  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 No significant reversibility is evident.  Decreased inferior septal wall motion is evident. The study is somewhat degraded by motion on the rest images.  The ejection fraction is diminished 24%.  The estimated diastolic volume is [id number] ml.  The estimated systolic volume is [id number] ml.  IMPRESSION:  1.  Remote apical and inferior wall infarct. 2.  Decreased septal and inferior wall motion and contractility. 3.  Decreased cardiac function with a dilated left ventricle.  The estimated ejection fraction is 24%. 4.  No focal reversibility to suggest acute disease.  Original Report Authenticated By: Resa Miner. MATTERN, M.D.   Medications:    . sodium chloride 50 mL (11/25/11 1133)      . aspirin EC  81 mg Oral Daily  . carvedilol  6.25 mg Oral BID WC  . furosemide  80 mg Oral BID  .  heparin subcutaneous  5,000 Units Subcutaneous Q8H  . isosorbide mononitrate  30 mg Oral Daily  . pantoprazole  40 mg Oral Q0600  . regadenoson  0.4 mg Intravenous Once  . rosuvastatin  20 mg Oral q1800  . sodium chloride  200 mL Intravenous Once  . sodium chloride  3 mL Intravenous Q12H  . Ticagrelor  90 mg Oral BID  . DISCONTD: simvastatin  20 mg Oral q1800    I  have reviewed scheduled and prn medications.  Physical Exam: General: alert, sitting up on side of bed Heart: regular, no rub Lungs: clear, decreased at bases slightly Abdomen: soft, no ascites Extremities: trivial edema LE's Neuro: alert and 0x3  Problem/Plan:  1. AKI/CKD.  Creatinine up today, may be levelling off.  This may be a new baseline for him. Last creatinine we have was several years ago (1.4).  Will decrease lasix to 40 mg bid po, pulm edema has completely resolved clinically and he doesn't have much extra volume on exam.  (Also, pt had high kappa/lambda UPEP on spot UA, as well as some hematuria and proteinuria. Will order serologies and 24 hour UPEP to quantifiy).  2. Hypotension- BP's soft, on low dose carvedilol only.  3. AMI.  No ischemia on stress test, EF low. 4. Acute pulm edema- resolved Kelly Splinter, MD [facility] [phone] pager   [phone] cell 11/28/2011, 8:40 AM

## 2011-11-28 NOTE — Progress Notes (Signed)
Subjective:  No chets pain  Objective:  Vital Signs in the last 24 hours: Temp:  [97.6 F (36.4 C)-98.3 F (36.8 C)] 98.3 F (36.8 C) (01/05 0400) Pulse Rate:  [67-83] 68  (01/05 0400) Resp:  [18-22] 22  (01/05 0400) BP: (100-135)/(60-98) 115/73 mmHg (01/05 0400) SpO2:  [96 %-100 %] 98 % (01/05 0400) Weight:  [72.1 kg (158 lb 15.2 oz)] 158 lb 15.2 oz (72.1 kg) (01/05 0500)  Intake/Output from previous day:  Intake/Output Summary (Last 24 hours) at 11/28/11 0615 Last data filed at 11/28/11 0500  Gross per 24 hour  Intake   1603 ml  Output   2800 ml  Net  -1197 ml    Physical Exam: General appearance: alert, cooperative and no distress Lungs: clear to auscultation bilaterally Heart: regular rate and rhythm, 1-2/6 syst murmur apex   Rate: 68  Rhythm: normal sinus rhythm, BBB PACs  Lab Results:  Basename 11/27/11 0520 11/26/11 0542  WBC 7.6 8.8  HGB 10.7* 12.3*  PLT 224 243    Basename 11/27/11 0520 11/26/11 0542  NA 138 139  K 3.6 4.6  CL 106 103  CO2 21 21  GLUCOSE 104* 116*  BUN 67* 77*  CREATININE 2.91* 3.31*   No results found for this basename: TROPONINI:2,CK,MB:2 in the last 72 hours Hepatic Function Panel  Basename 11/26/11 0542  PROT 7.5  ALBUMIN 3.1*  AST 23  ALT 18  ALKPHOS 67  BILITOT 0.5  BILIDIR --  IBILI --   No results found for this basename: CHOL in the last 72 hours No results found for this basename: INR in the last 72 hours  Imaging: Nm Myocar Multi W/spect W/wall Motion / Ef  11/27/2011  *RADIOLOGY REPORT*  Clinical Data:  Myocardial infarction.  MYOCARDIAL IMAGING WITH SPECT (REST AND PHARMACOLOGIC-STRESS) GATED LEFT VENTRICULAR WALL MOTION STUDY LEFT VENTRICULAR EJECTION FRACTION  Technique:  Standard myocardial SPECT imaging was performed after resting intravenous injection of 10 mCi Tc-70m tetrofosmin. Subsequently, intravenous infusion of lexiscan was performed under the supervision of the Cardiology staff.  At peak effect of  the drug, 30 mCi Tc-30m tetrofosmin was injected intravenously and standard myocardial SPECT  imaging was performed.  Quantitative gated imaging was also performed to evaluate left ventricular wall motion, and estimate left ventricular ejection fraction.  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 No significant reversibility is evident.  Decreased inferior septal wall motion is evident. The study is somewhat degraded by motion on the rest images.  The ejection fraction is diminished 24%.  The estimated diastolic volume is 123XX123 ml.  The estimated systolic volume is [id number] ml.  IMPRESSION:  1.  Remote apical and inferior wall infarct. 2.  Decreased septal and inferior wall motion and contractility. 3.  Decreased cardiac function with a dilated left ventricle.  The estimated ejection fraction is 24%. 4.  No focal reversibility to suggest acute disease.  Original Report Authenticated By: Resa Miner. MATTERN, M.D.    Cardiac Studies:  Assessment/Plan:   Principal Problem:  *Acute respiratory failure requiring BiPap - Secondary to Acute Diastolic (+/- Systolic) CHF in setting of NSTEMI Active Problems:  Acute pulmonary edema - secondary to ACS/NSTEMI with acute CHF   NSTEMI (non-ST elevated myocardial infarction),acute MI  Alternating LBBB and RBBB  Renal failure, acute  Ischemic cardiomyopathy - Acute  Hypotension, transient  Tachycardia  HTN (hypertension)  Dyslipidemia, LDL 180   Plan- review myoveiw with md  Kerin Ransom PA-C 11/28/2011, 6:15 AM  I have seen and examined the patient along with Kerin Ransom, PA.  I have reviewed the chart, notes and new data.  I agree with PA's note.   Key new complaints: No angina or dyspnea at rest  Key examination changes: Clear lungs on exam; good BP control. Key new findings / data: Creat has worsened again, maybe due to negative fluid balance (1L/24h). Nuclear stress test shows EF 24%, inferior  scar, no reversible ischemia.  PLAN: Nuclear perfusion study does not allay fears re: extensive multi vessel CAD. Could easily have occluded RCA and high grade left main coronary stenosis with "balanced ischemia" in remaining viable myocardium. This is of particular concern in view of response to Lexiscan (hypotension, epigastric pain).  Unfortunately, worsening of renal function again precludes angiography. Hopefully we can turn this around over the weekend for angio next week.  High risk of malignant ventricular arrhythmia. Needs Life Vest at discharge regardless of results of angiography or if revascularization is possible.  Sanda Klein, MD, [place] [phone] 11/28/2011, 8:28 AM

## 2011-11-29 LAB — RENAL FUNCTION PANEL
Albumin: 2.4 g/dL — ABNORMAL LOW (ref 3.5–5.2)
BUN: 68 mg/dL — ABNORMAL HIGH (ref 6–23)
CO2: 21 meq/L (ref 19–32)
Calcium: 7.7 mg/dL — ABNORMAL LOW (ref 8.4–10.5)
Chloride: 107 meq/L (ref 96–112)
Creatinine, Ser: 2.82 mg/dL — ABNORMAL HIGH (ref 0.50–1.35)
GFR calc Af Amer: 25 mL/min — ABNORMAL LOW
GFR calc non Af Amer: 22 mL/min — ABNORMAL LOW
Glucose, Bld: 112 mg/dL — ABNORMAL HIGH (ref 70–99)
Phosphorus: 4.1 mg/dL (ref 2.3–4.6)
Potassium: 3.4 meq/L — ABNORMAL LOW (ref 3.5–5.1)
Sodium: 139 meq/L (ref 135–145)

## 2011-11-29 LAB — CBC
HCT: 30.4 % — ABNORMAL LOW (ref 39.0–52.0)
Hemoglobin: 9.9 g/dL — ABNORMAL LOW (ref 13.0–17.0)
MCH: 26.9 pg (ref 26.0–34.0)
MCV: 82.6 fL (ref 78.0–100.0)
RBC: 3.68 MIL/uL — ABNORMAL LOW (ref 4.22–5.81)

## 2011-11-29 MED ORDER — POTASSIUM CHLORIDE CRYS ER 20 MEQ PO TBCR
20.0000 meq | EXTENDED_RELEASE_TABLET | Freq: Once | ORAL | Status: AC
  Administered 2011-11-29: 20 meq via ORAL
  Filled 2011-11-29: qty 1

## 2011-11-29 NOTE — Progress Notes (Addendum)
Subjective: Walked in the hall today.  No problems.  Excellent UOP on lower dose of lasix, BP better (up).   Objective Vital signs in last 24 hours: Filed Vitals:   11/29/11 0400 11/29/11 0814 11/29/11 0815 11/29/11 1115  BP:   152/46 140/80  Pulse:      Temp: 98.4 F (36.9 C) 97.4 F (36.3 C)  97.7 F (36.5 C)  TempSrc: Oral Oral  Oral  Resp: 18     Height:      Weight:      SpO2: 99%      Weight change:   Intake/Output Summary (Last 24 hours) at 11/29/11 1511 Last data filed at 11/29/11 1400  Gross per 24 hour  Intake   1880 ml  Output   1675 ml  Net    205 ml   Labs: Basic Metabolic Panel:  Lab [id number] 11/28/11 0620 11/27/11 0520 11/26/11 0542 11/25/11 0115 11/24/11 0830 11/23/11 0534  NA 139 137 138 139 132* 139 139  K 3.4* 4.1 3.6 4.6 3.7 4.3 4.1  CL 107 100 106 103 97 100 102  CO2 21 22 21 21 23 22  18*  GLUCOSE 112* 117* 104* 116* 146* 126* 115*  BUN 68* 74* 67* 77* 80* 78* 72*  CREATININE 2.82* 3.25* 2.91* 3.31* 3.83* 3.46* 3.45*  ALB -- -- -- -- -- -- --  CALCIUM 7.7* 9.1 7.6* 9.2 8.6 9.3 8.9  PHOS 4.1 -- -- -- 5.4* 5.6* 5.1*   Liver Function Tests:  Lab 11/29/11 0625 11/26/11 0542 11/25/11 0115  AST -- 23 --  ALT -- 18 --  ALKPHOS -- 67 --  BILITOT -- 0.5 --  PROT -- 7.5 --  ALBUMIN 2.4* 3.1* 2.7*   No results found for this basename: LIPASE:3,AMYLASE:3 in the last 168 hours No results found for this basename: AMMONIA:3 in the last 168 hours CBC:  Lab 11/29/11 0625 11/28/11 0620 11/27/11 0520 11/26/11 0542  WBC 9.0 9.0 7.6 8.8  NEUTROABS -- -- -- --  HGB 9.9* 11.8* 10.7* 12.3*  HCT 30.4* 35.1* 32.6* 37.0*  MCV 82.6 81.8 82.1 82.6  PLT 220 246 224 243   PT/INR: @labrcntip (inr:5) Cardiac Enzymes: No results found for this basename: CKTOTAL:5,CKMB:5,CKMBINDEX:5,TROPONINI:5 in the last 168 hours CBG: No results found for this basename: GLUCAP:5 in the last 168 hours  Iron Studies:   Lab 11/23/11 0534  IRON 30*  TIBC 267    TRANSFERRIN --  FERRITIN 138   Studies/Results: No results found. Medications:    . sodium chloride 50 mL/hr at 11/28/11 1242      . aspirin EC  81 mg Oral Daily  . carvedilol  6.25 mg Oral BID WC  . heparin subcutaneous  5,000 Units Subcutaneous Q8H  . isosorbide mononitrate  30 mg Oral Daily  . pantoprazole  40 mg Oral Q0600  . potassium chloride  20 mEq Oral Once  . rosuvastatin  20 mg Oral q1800  . sodium chloride  200 mL Intravenous Once  . sodium chloride  3 mL Intravenous Q12H  . Ticagrelor  90 mg Oral BID    I  have reviewed scheduled and prn medications.  Physical Exam: General: alert, sitting up on side of bed Heart: regular, no rub Lungs: clear, decreased at bases slightly Abdomen: soft, no ascites Extremities: trivial edema LE's Neuro: alert and 0x3  Problem/Plan:  1. AKI on CKD.  Suspect creat near baseline (i.e., CKD IV).  Last several days creat has been hovering around  3.0. Last creatinine we have was in 2005 (1.4). Repeat UA negative for protein now, and 24 hr collection only 285 mg, so would not pursue serologic workup.  UPEP is negative for M-spike.  Serologic work-up was negative. No further suggestions.  Will sign off.  Please call as needed.  Will schedule outpt f/u   2. Hypotension - better.  On low dose carvedilol only.  3. AMI.  No ischemia on stress test, EF low. Possible heart cath soon per cardiology.   4. Acute pulm edema- resolved. 5. MBD - PTH 234, phos 4-5 range Kelly Splinter, MD [facility] [phone] pager   [phone] cell 11/29/2011, 3:11 PM

## 2011-11-29 NOTE — Progress Notes (Signed)
Subjective:  No chest pain or SOB  Objective:  Vital Signs in the last 24 hours: Temp:  [97.3 F (36.3 C)-98.7 F (37.1 C)] 97.4 F (36.3 C) (01/06 0814) Pulse Rate:  [72] 72  (01/05 1521) Resp:  [16-18] 18  (01/06 0400) BP: (102-110)/(65-78) 110/78 mmHg (01/06 0330) SpO2:  [95 %-99 %] 99 % (01/06 0400)  Intake/Output from previous day:  Intake/Output Summary (Last 24 hours) at 11/29/11 0816 Last data filed at 11/29/11 0600  Gross per 24 hour  Intake   1910 ml  Output   2525 ml  Net   -615 ml    Physical Exam: General appearance: alert, cooperative and no distress Lungs: clear to auscultation bilaterally Heart: regular rate and rhythm   Rate: 62  Rhythm: normal sinus rhythm, LBBB, PACs  Lab Results:  Basename 11/29/11 0625 11/28/11 0620  WBC 9.0 9.0  HGB 9.9* 11.8*  PLT 220 246    Basename 11/29/11 0625 11/28/11 0620  NA 139 137  K 3.4* 4.1  CL 107 100  CO2 21 22  GLUCOSE 112* 117*  BUN 68* 74*  CREATININE 2.82* 3.25*   No results found for this basename: TROPONINI:2,CK,MB:2 in the last 72 hours Hepatic Function Panel  Basename 11/29/11 0625  PROT --  ALBUMIN 2.4*  AST --  ALT --  ALKPHOS --  BILITOT --  BILIDIR --  IBILI --   No results found for this basename: CHOL in the last 72 hours No results found for this basename: INR in the last 72 hours  Imaging: Nm Myocar Multi W/spect W/wall Motion / Ef  11/27/2011  *RADIOLOGY REPORT*  Clinical Data:  Myocardial infarction.  MYOCARDIAL IMAGING WITH SPECT (REST AND PHARMACOLOGIC-STRESS) GATED LEFT VENTRICULAR WALL MOTION STUDY LEFT VENTRICULAR EJECTION FRACTION  Technique:  Standard myocardial SPECT imaging was performed after resting intravenous injection of 10 mCi Tc-35m tetrofosmin. Subsequently, intravenous infusion of lexiscan was performed under the supervision of the Cardiology staff.  At peak effect of the drug, 30 mCi Tc-68m tetrofosmin was injected intravenously and standard myocardial SPECT   imaging was performed.  Quantitative gated imaging was also performed to evaluate left ventricular wall motion, and estimate left ventricular ejection fraction.  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 No significant reversibility is evident.  Decreased inferior septal wall motion is evident. The study is somewhat degraded by motion on the rest images.  The ejection fraction is diminished 24%.  The estimated diastolic volume is 123XX123 ml.  The estimated systolic volume is [id number] ml.  IMPRESSION:  1.  Remote apical and inferior wall infarct. 2.  Decreased septal and inferior wall motion and contractility. 3.  Decreased cardiac function with a dilated left ventricle.  The estimated ejection fraction is 24%. 4.  No focal reversibility to suggest acute disease.  Original Report Authenticated By: Resa Miner. MATTERN, M.D.    Cardiac Studies:  Assessment/Plan:   Principal Problem:  *Acute respiratory failure requiring BiPap - Secondary to Acute Diastolic (+/- Systolic) CHF in setting of NSTEMI  Active Problems:  Acute pulmonary edema - secondary to ACS/NSTEMI with acute CHF   NSTEMI (non-ST elevated myocardial infarction),acute MI  Alternating LBBB and RBBB  Renal failure, acute  Ischemic cardiomyopathy - Acute  Hypotension, transient  Tachycardia  HTN (hypertension)  Dyslipidemia, LDL 180  Plan- will discuss with MD, some improvement in Cr to 2.8.Re place K+. Cath possibly this week.    Kerin Ransom PA-C 11/29/2011,  8:16 AM  I have seen and examined the patient along withLuke Kilroy PA-C.  I have reviewed the chart, notes and new data.  I agree with PA's note.  Key new complaints: No angina or dyspnea at rest/light exertion Key examination changes: no clinical evidence of overt CHF Key new findings / data: despite negative fluid balance, creat has improved  PLAN: Coronary angio possibly in next 24-48 hours. The decidedly increased  risk of contrast nephrotoxicity, acute on chronic renal failure and even progression to ESRD/dialysis were reviewed in detail. We will take necessary precaution measures and if PCI is indicated, would recommend a staged procedure. Check for renal artery stenosis at time of cath?  Sanda Klein, MD, [place] [phone] 11/29/2011, 8:48 AM

## 2011-11-30 ENCOUNTER — Encounter (HOSPITAL_COMMUNITY): Payer: Medicare Other

## 2011-11-30 ENCOUNTER — Other Ambulatory Visit: Payer: Self-pay

## 2011-11-30 ENCOUNTER — Encounter (HOSPITAL_COMMUNITY)
Admission: EM | Disposition: A | Discharge status: Home / Self Care | Payer: Self-pay | Source: Home / Self Care | Attending: Cardiovascular Disease

## 2011-11-30 DIAGNOSIS — I2510 Atherosclerotic heart disease of native coronary artery without angina pectoris: Secondary | ICD-10-CM

## 2011-11-30 DIAGNOSIS — Z01810 Encounter for preprocedural cardiovascular examination: Secondary | ICD-10-CM

## 2011-11-30 HISTORY — PX: LEFT HEART CATHETERIZATION WITH CORONARY ANGIOGRAM: SHX5451

## 2011-11-30 LAB — BASIC METABOLIC PANEL
BUN: 61 mg/dL — ABNORMAL HIGH (ref 6–23)
CO2: 21 mEq/L (ref 19–32)
Calcium: 8.8 mg/dL (ref 8.4–10.5)
Chloride: 103 mEq/L (ref 96–112)
Creatinine, Ser: 2.8 mg/dL — ABNORMAL HIGH (ref 0.50–1.35)
GFR calc Af Amer: 26 mL/min — ABNORMAL LOW (ref >90)
GFR calc non Af Amer: 22 mL/min — ABNORMAL LOW (ref >90)
Glucose, Bld: 111 mg/dL — ABNORMAL HIGH (ref 70–99)
Potassium: 4.3 mEq/L (ref 3.5–5.1)
Sodium: 136 mEq/L (ref 135–145)

## 2011-11-30 LAB — PROTIME-INR: INR: 1.07 (ref 0.00–1.49)

## 2011-11-30 LAB — CBC
MCH: 27.2 pg (ref 26.0–34.0)
MCHC: 32.9 g/dL (ref 30.0–36.0)
Platelets: 230 10*3/uL (ref 150–400)
RDW: 15 % (ref 11.5–15.5)

## 2011-11-30 SURGERY — LEFT HEART CATHETERIZATION WITH CORONARY ANGIOGRAM
Anesthesia: LOCAL

## 2011-11-30 MED ORDER — ACETAMINOPHEN 325 MG PO TABS: 650.0000 mg | ORAL_TABLET | ORAL | Status: DC | PRN

## 2011-11-30 MED ORDER — ASPIRIN 81 MG PO CHEW
324.0000 mg | CHEWABLE_TABLET | ORAL | Status: DC
  Filled 2011-11-30: qty 4

## 2011-11-30 MED ORDER — ONDANSETRON HCL 4 MG/2ML IJ SOLN: 4.0000 mg | Freq: Four times a day (QID) | INTRAMUSCULAR | Status: DC | PRN

## 2011-11-30 MED ORDER — HEPARIN (PORCINE) IN NACL 2-0.9 UNIT/ML-% IJ SOLN
INTRAMUSCULAR | Status: AC
  Filled 2011-11-30: qty 2000

## 2011-11-30 MED ORDER — SODIUM CHLORIDE 0.9 % IV SOLN: 250.0000 mL | INTRAVENOUS | Status: DC | PRN

## 2011-11-30 MED ORDER — FENTANYL CITRATE 0.05 MG/ML IJ SOLN
INTRAMUSCULAR | Status: AC
  Filled 2011-11-30: qty 2

## 2011-11-30 MED ORDER — ASPIRIN 81 MG PO CHEW
324.0000 mg | CHEWABLE_TABLET | Freq: Once | ORAL | Status: AC
  Administered 2011-11-30: 324 mg via ORAL

## 2011-11-30 MED ORDER — LIDOCAINE HCL (PF) 1 % IJ SOLN
INTRAMUSCULAR | Status: AC
Start: 2011-11-30 — End: 2011-11-30
  Filled 2011-11-30: qty 30

## 2011-11-30 MED ORDER — MIDAZOLAM HCL 2 MG/2ML IJ SOLN
INTRAMUSCULAR | Status: AC
  Filled 2011-11-30: qty 2

## 2011-11-30 MED ORDER — SODIUM CHLORIDE 0.9 % IJ SOLN: 3.0000 mL | INTRAMUSCULAR | Status: DC | PRN

## 2011-11-30 MED ORDER — NITROGLYCERIN 0.2 MG/ML ON CALL CATH LAB
INTRAVENOUS | Status: AC
  Filled 2011-11-30: qty 1

## 2011-11-30 MED ORDER — SODIUM CHLORIDE 0.9 % IV SOLN: 1.0000 mL/kg/h | INTRAVENOUS | Status: DC

## 2011-11-30 MED ORDER — SODIUM CHLORIDE 0.9 % IJ SOLN
3.0000 mL | Freq: Two times a day (BID) | INTRAMUSCULAR | Status: DC
  Administered 2011-11-30 – 2011-12-01 (×2): 3 mL via INTRAVENOUS

## 2011-11-30 MED ORDER — DIAZEPAM 5 MG PO TABS
5.0000 mg | ORAL_TABLET | ORAL | Status: AC
  Administered 2011-11-30: 5 mg via ORAL
  Filled 2011-11-30: qty 1

## 2011-11-30 NOTE — Progress Notes (Signed)
Pt. Seen and examined. Agree with the NP/PA-C note as written. Plan LHC today with minimal dye. Suspect possible surgical disease. He understands risk of short term or even permanent dialysis is possible, however, less likely. He is agreeable to catheterization, despite this risk and willing to proceed.  Pixie Casino, MD Attending Cardiologist [facility]

## 2011-11-30 NOTE — Progress Notes (Signed)
Pt was transferred to the cath lab on monitor with RN.  Family was directed to the cath lab waiting area.

## 2011-11-30 NOTE — Progress Notes (Addendum)
VASCULAR LAB PRELIMINARY  PRELIMINARY  PRELIMINARY  PRELIMINARY  Pre-op Cardiac Surgery  Carotid Findings:  Bilateral:  No evidence of ICA stenosis.  Vertebral flow is antegrade.  Upper Extremity Right Left  Brachial Pressures 113 113  Radial Waveforms Tri Tri  Ulnar Waveforms Tri Tri  Palmar Arch (Allen's Test) Waveform remains normal with radial compression and decreases >50% with ulnar compression. Waveform remains normal with radial and ulnar compression.   Findings:      Lower  Extremity Right Left  Dorsalis Pedis    Anterior Tibial 85, Biphasic 100, Biphasic  Posterior Tibial 59, Monophasic 115, Biphasic  Ankle/Brachial Indices 0.75, Mild disease, borderline moderate disease 1.02, Normal    Findings:     Francisco [name], RDMS 11/30/2011, 5:13 PM

## 2011-11-30 NOTE — Op Note (Signed)
[place]     CARDIAC CATHETERIZATION REPORT  Francisco [name]   IH:[id number] 1946-01-10  Performing Cardiologist: Pixie Casino Primary Physician: Sherrie Mustache, MD, MD Primary Cardiologist:  Dr. Claiborne Billings  Procedures Performed:  Left Heart Catheterization via 5 Fr right femoral access  Native Coronary Angiography  Indication(s): chest pain, NSTEMI  History: 66 y.o. male with a history of hypertension who presented with heart failure and acute pulmonary edema and ultimately had a non-ST elevation MI. Unfortunately this is complicated by what appears to be chronic kidney disease with a creatinine of 2.8. He underwent nuclear stress testing, and had a near arrest after administration of Lexiscan do to hypotension. Perfusion imaging demonstrated inferior and apical fixed defects. The left ventricular ejection fraction was globally reduced at 24%. He is referred for cardiac catheterization with dye minimization.  Consent: The procedure with Risks/Benefits/Alternatives and Indications was reviewed with the patient.  All questions were answered.    Risks / Complications include, but not limited to: Death, MI, CVA/TIA, VF/VT (with defibrillation), Bradycardia (need for temporary pacer placement), contrast induced nephropathy, bleeding / bruising / hematoma / pseudoaneurysm, vascular or coronary injury (with possible emergent CT or Vascular Surgery), adverse medication reactions, infection.    The patient voices understanding and agree to proceed.    Risks of procedure as well as the alternatives and risks of each were explained to the (patient/caregiver).  Consent for procedure obtained. Consent for signed by MD and patient with RN witness -- placed on chart.  Procedure: The patient was brought to the [facility]nd [place] [facility]atheter[facility] Lab in the fasting state and prepped and draped in the usual sterile fashion for (Right groin) access.   Sterile  technique was used including antiseptics, cap, gloves, gown, hand hygiene, mask and sheet.  Skin prep: Chlorhexidine;  Time Out: Verified patient identification, verified procedure, site/side was marked, verified correct patient position, special equipment/implants available, medications/allergies/relevent history reviewed, required imaging and test results available.  Performed  The right femoral head was identified using tactile and fluoroscopic technique.  The right groin was anesthetized with 1% subcutaneous Lidocaine.  The right Common Femoral Artery was accessed using the Modified Seldinger Technique with placement of a antimicrobial bonded/coated single lumen (5 Fr) sheath was placed in the right femoral artery using the Seldinger technique.  The sheath was aspirated and flushed.    A 5 Fr JL4 Catheter was advanced of over a Standard J wire into the ascending Aorta.  The catheter was used to engage the left coronary artery.  Multiple cineangiographic views of the left coronary artery system(s) were performed. A 5 Fr AR2 Catheter was advanced of over a Exchange Safety J wire into the ascending Aorta, as the aorta was noted to take a tortuous course.  The catheter was used to engage the right coronary artery.  Multiple cineangiographic views of the right coronary artery system(s) were performed and LV hemodynamics were measured along with a measurement of "pull-back" gradient across the aortic valve.  The catheter and the wire was removed completely out of the body.  Minimal dye was used and 4 cineangiograms were obtained of the left and right systems.  The patient was transferred to the holding area where the sheath was removed with manual pressure held for hemostasis.   The patient was transported to the holding area in stable condition.   The patient  was stable before, during and following the procedure.   Patient did tolerate procedure well.  There were not complications.  EBL: 10  cc  Medications:  Premedication: 5 mg  Valium  Sedation:  1 mg IV Versed, 25 IV mcg Fentanyl  Contrast:  41 Omnipaque   Hemodynamics:  Central Aortic Pressure / Mean Aortic Pressure: 90/65  LV Pressure / LV End diastolic Pressure:  8  Coronary Angiographic Data:  Left Main:  No angiographic stenosis.  Left Anterior Descending (LAD):   Calcified LAD with a 95% proximal stenosis, just distal to a large D1 branch. There is a discrete 80% mid-LAD stenosis at the D2 branch.  1st diagonal (D1):  Large caliber branch which supplies a good portion of the lateral wall. There appears to be a severe 80% stenosis of the proximal vessel with homocollaterals from the LCX system noted.  2nd diagonal (D2):  Small vessel, appears to be co-located with an 80% mid LAD stenosis.  3rd diagonal (D3):  Small vessel  Circumflex (LCx): There is a tubular stenosis of the mid to distal LCx, after the take-off of a large OM1 branch. 1st obtuse marginal: Large branch with moderate to severe ostial and proximal lesions.    Right Coronary Artery: Calcified vessel with moderate proximal stenosis and a 95% distal stenosis just prior to the bifurcation of the PLB and PDA branches.  right ventricle branch of right coronary artery: Large vessel with severe ostial stenosis.  posterior descending artery: Decent caliber vessel.  posterior lateral branch:  May have mid vessel stenosis.  Impression: 1.  Multivessel coronary artery disease. LVEF by nuclear stress test is 24%. 2.  Acute or acute on chronic kidney disease. 3.  Tortuous aorta which was not evaluated due to dye constraints.  Plan: 1.  Recommend CT surgery evaluation for coronary artery bypass grafting.  The case and results was discussed with the patient and family. The case and results was not discussed with the patient's PCP. The case and results was not discussed with the patient's Cardiologist.  Time Spend Directly with Patient:  45  minutes  Pixie Casino, MD Attending Cardiologist [facility] 11/30/2011, 11:52 AM

## 2011-11-30 NOTE — Consult Note (Signed)
Reason for Consult:3 vessel CAD, s/p MI, ischemic cardiomyopathy Referring Physician: Dr. Earl Gala is an 66 y.o. male.  HPI: 66 yo with history of hypertension, presented 11/21/11 with CP for 24 hours and SOB. R/I for MI with CK > 4000, MB 499.5. Also in CHF with BNP > 8000. On admission Creatinine was 2.4, rose to 3.8, now back to 2.8. No CP since admission.  W/u included Echo- EF 30% with mild-moderate MR, nuclear scan EF = 24%, and cath today which showed severe 3 vessel CAD. Currently pain free.  Past Medical History  Diagnosis Date  .  requiring BiPap 11/21/2011  . Acute pulmonary edema 11/21/2011  . Tachycardia 11/21/2011  . HTN (hypertension) 11/21/2011  . NSTEMI (non-ST elevated myocardial infarction) 11/21/2011    History reviewed. No pertinent past surgical history.  History reviewed. No pertinent family history.  Social History:  reports that he has never smoked. He does not have any smokeless tobacco history on file. He reports that he does not drink alcohol. His drug history not on file.  Allergies:  Allergies  Allergen Reactions  . Lexiscan Other (See Comments)    Unresponsive, Abdominal Pain    Medications:  Prior to Admission:  Prescriptions prior to admission  Medication Sig Dispense Refill  . amLODipine (NORVASC) 10 MG tablet Take 10 mg by mouth daily.        Marland Kitchen aspirin 81 MG tablet Take 81 mg by mouth daily.        Marland Kitchen atenolol (TENORMIN) 100 MG tablet Take 100 mg by mouth daily.        . cloNIDine (CATAPRES) 0.2 MG tablet Take 0.2 mg by mouth 2 (two) times daily.        Marland Kitchen lisinopril-hydrochlorothiazide (PRINZIDE,ZESTORETIC) 20-12.5 MG per tablet Take 1 tablet by mouth daily.        . pravastatin (PRAVACHOL) 20 MG tablet Take 20 mg by mouth at bedtime.          Results for orders placed during the hospital encounter of 11/21/11 (from the past 48 hour(s))  RENAL FUNCTION PANEL     Status: Abnormal   Collection Time   11/29/11  6:25 AM   Component Value Range Comment   Sodium 139  135 - 145 (mEq/L)    Potassium 3.4 (*) 3.5 - 5.1 (mEq/L)    Chloride 107  96 - 112 (mEq/L)    CO2 21  19 - 32 (mEq/L)    Glucose, Bld 112 (*) 70 - 99 (mg/dL)    BUN 68 (*) 6 - 23 (mg/dL)    Creatinine, Ser 2.82 (*) 0.50 - 1.35 (mg/dL)    Calcium 7.7 (*) 8.4 - 10.5 (mg/dL)    Phosphorus 4.1  2.3 - 4.6 (mg/dL)    Albumin 2.4 (*) 3.5 - 5.2 (g/dL)    GFR calc non Af Amer 22 (*) >90 (mL/min)    GFR calc Af Amer 25 (*) >90 (mL/min)   CBC     Status: Abnormal   Collection Time   11/29/11  6:25 AM      Component Value Range Comment   WBC 9.0  4.0 - 10.5 (K/uL)    RBC 3.68 (*) 4.22 - 5.81 (MIL/uL)    Hemoglobin 9.9 (*) 13.0 - 17.0 (g/dL)    HCT 30.4 (*) 39.0 - 52.0 (%)    MCV 82.6  78.0 - 100.0 (fL)    MCH 26.9  26.0 - 34.0 (pg)    MCHC 32.6  30.0 - 36.0 (g/dL)    RDW 15.1  11.5 - 15.5 (%)    Platelets 220  150 - 400 (K/uL)   CBC     Status: Abnormal   Collection Time   11/30/11  6:05 AM      Component Value Range Comment   WBC 12.9 (*) 4.0 - 10.5 (K/uL)    RBC 3.93 (*) 4.22 - 5.81 (MIL/uL)    Hemoglobin 10.7 (*) 13.0 - 17.0 (g/dL)    HCT 32.5 (*) 39.0 - 52.0 (%)    MCV 82.7  78.0 - 100.0 (fL)    MCH 27.2  26.0 - 34.0 (pg)    MCHC 32.9  30.0 - 36.0 (g/dL)    RDW 15.0  11.5 - 15.5 (%)    Platelets 230  150 - 400 (K/uL)   BASIC METABOLIC PANEL     Status: Abnormal   Collection Time   11/30/11  6:05 AM      Component Value Range Comment   Sodium 136  135 - 145 (mEq/L)    Potassium 4.3  3.5 - 5.1 (mEq/L) NO VISIBLE HEMOLYSIS   Chloride 103  96 - 112 (mEq/L)    CO2 21  19 - 32 (mEq/L)    Glucose, Bld 111 (*) 70 - 99 (mg/dL)    BUN 61 (*) 6 - 23 (mg/dL)    Creatinine, Ser 2.80 (*) 0.50 - 1.35 (mg/dL)    Calcium 8.8  8.4 - 10.5 (mg/dL)    GFR calc non Af Amer 22 (*) >90 (mL/min)    GFR calc Af Amer 26 (*) >90 (mL/min)   PROTIME-INR     Status: Normal   Collection Time   11/30/11  6:05 AM      Component Value Range Comment   Prothrombin Time  14.1  11.6 - 15.2 (seconds)    INR 1.07  0.00 - 1.49    APTT     Status: Normal   Collection Time   11/30/11  2:53 PM      Component Value Range Comment   aPTT 25  24 - 37 (seconds)     No results found.  Review of Systems  Constitutional: Positive for malaise/fatigue. Negative for fever and chills.  Eyes: Negative.   Respiratory: Positive for cough, shortness of breath and wheezing.   Cardiovascular: Positive for chest pain.  Gastrointestinal: Positive for nausea and vomiting.  Genitourinary: Negative.   Musculoskeletal: Negative.   Skin: Negative.   Neurological: Negative.   Endo/Heme/Allergies: Negative.   All other systems reviewed and are negative.   Blood pressure 119/76, pulse 80, temperature 98.2 F (36.8 C), temperature source Oral, resp. rate 20, height 5\' 5"  (1.651 m), weight 73.6 kg (162 lb 4.1 oz), SpO2 96.00%. Physical Exam  Vitals reviewed. Constitutional: He is oriented to person, place, and time. He appears well-developed and well-nourished. No distress.  HENT:  Head: Normocephalic and atraumatic.  Eyes: EOM are normal. Pupils are equal, round, and reactive to light.  Neck: Normal range of motion. No thyromegaly present.  Cardiovascular: Normal rate and regular rhythm.  Exam reveals gallop. Exam reveals no friction rub.   No murmur heard.      Difficult to palpate DP or PT Bilateral  Respiratory: Effort normal and breath sounds normal. He has no wheezes. He has no rales.  GI: Soft. There is no tenderness.  Musculoskeletal: He exhibits no edema.  Lymphadenopathy:    He has no cervical adenopathy.  Neurological: He is alert and  oriented to person, place, and time. No cranial nerve deficit.  Skin: Skin is warm and dry.  Psychiatric: He has a normal mood and affect.    Assessment/Plan: 66 yo male with history of hypertension presented with MI complicated by acute respiratory failure secondary to CHF and acute on chronic renal failure. He has severe ischemic  cardiomyopathy and 3 vessel CAD. He is high risk for CABG due to severity of LV dysfunction and his renal failure. He would need CABG, with intraoperative TEE to assess the mitral regurgitation and possible mitral repair(ring).  I have discussed with the patient and his family the general nature of the procedure, need for general anesthesia,and incisions to be used. I have discussed the expected hospital stay, overall recovery and short and long term outcomes. They understand the risks include but are not limited to death, stroke, MI, DVT/PE, bleeding, possible need for transfusion, infections,other organ system dysfunction including respiratory, renal, or GI complications. They understand he is high risk with an operative mortality of 8-10% and a high risk of renal failure requiring temporary or permanent dialysis. They understand and accept these risks and agree to proceed.  Will need carotid duplex.  Will check BMET in AM  Probably surgery Wed or Thursday if renal status stable  Lorissa Kishbaugh C 11/30/2011, 4:57 PM

## 2011-11-30 NOTE — Progress Notes (Signed)
Subjective:  No chest pain, no SOB  Objective:  Vital Signs in the last 24 hours: Temp:  [97.7 F (36.5 C)-100.7 F (38.2 C)] 98.3 F (36.8 C) (01/07 0734) Pulse Rate:  [80-85] 85  (01/07 0734) Resp:  [16-18] 16  (01/07 0749) BP: (98-140)/(68-80) 114/72 mmHg (01/07 0734) SpO2:  [96 %-99 %] 99 % (01/07 0734) Weight:  [73.6 kg (162 lb 4.1 oz)] 162 lb 4.1 oz (73.6 kg) (01/07 0500)  Intake/Output from previous day:  Intake/Output Summary (Last 24 hours) at 11/30/11 0840 Last data filed at 11/30/11 0700  Gross per 24 hour  Intake   2310 ml  Output   1380 ml  Net    930 ml    Physical Exam: General appearance: alert, cooperative and no distress Lungs: clear to auscultation bilaterally Heart: regular rate and rhythm   Rate: 78  Rhythm: normal sinus rhythm  Lab Results:  Basename 11/30/11 0605 11/29/11 0625  WBC 12.9* 9.0  HGB 10.7* 9.9*  PLT 230 220    Basename 11/30/11 0605 11/29/11 0625  NA 136 139  K 4.3 3.4*  CL 103 107  CO2 21 21  GLUCOSE 111* 112*  BUN 61* 68*  CREATININE 2.80* 2.82*   No results found for this basename: TROPONINI:2,CK,MB:2 in the last 72 hours Hepatic Function Panel  Basename 11/29/11 0625  PROT --  ALBUMIN 2.4*  AST --  ALT --  ALKPHOS --  BILITOT --  BILIDIR --  IBILI --   No results found for this basename: CHOL in the last 72 hours  Basename 11/30/11 0605  INR 1.07    Imaging: No results found.  Cardiac Studies:  Assessment/Plan:   Principal Problem:  *Acute respiratory failure requiring BiPap - Secondary to Acute Diastolic (+/- Systolic) CHF in setting of NSTEMI Active Problems:  Acute pulmonary edema - secondary to ACS/NSTEMI with acute CHF   NSTEMI (non-ST elevated myocardial infarction),acute MI  Alternating LBBB and RBBB  Renal failure, acute  Ischemic cardiomyopathy - Acute  Hypotension, transient  Tachycardia  HTN (hypertension)  Dyslipidemia, LDL 180   Plan- cath today with minimal dye.   Kerin Ransom PA-C 11/30/2011, 8:40 AM

## 2011-11-30 NOTE — Plan of Care (Signed)
Problem: Phase III Progression Outcomes Goal: Activity at appropriate level-compared to baseline (UP IN CHAIR FOR HEMODIALYSIS)  Outcome: Progressing Ambulating in room w/steady gait ,denies SOB,pain and discomfort.

## 2011-12-01 ENCOUNTER — Other Ambulatory Visit: Payer: Self-pay

## 2011-12-01 ENCOUNTER — Inpatient Hospital Stay (HOSPITAL_COMMUNITY): Payer: Medicare Other

## 2011-12-01 DIAGNOSIS — Z01810 Encounter for preprocedural cardiovascular examination: Secondary | ICD-10-CM

## 2011-12-01 LAB — URINE CULTURE
Colony Count: >=100000
Culture  Setup Time: 201301081336

## 2011-12-01 LAB — CBC
MCV: 83.4 fL (ref 78.0–100.0)
Platelets: 205 10*3/uL (ref 150–400)
RDW: 15.2 % (ref 11.5–15.5)
WBC: 13.4 10*3/uL — ABNORMAL HIGH (ref 4.0–10.5)

## 2011-12-01 LAB — BASIC METABOLIC PANEL
Chloride: 105 mEq/L (ref 96–112)
Creatinine, Ser: 2.88 mg/dL — ABNORMAL HIGH (ref 0.50–1.35)
GFR calc Af Amer: 25 mL/min — ABNORMAL LOW (ref >90)
GFR calc non Af Amer: 21 mL/min — ABNORMAL LOW (ref >90)

## 2011-12-01 MED ORDER — CIPROFLOXACIN HCL 500 MG PO TABS
500.0000 mg | ORAL_TABLET | Freq: Every day | ORAL | Status: DC
  Administered 2011-12-01 – 2011-12-02 (×2): 500 mg via ORAL
  Filled 2011-12-01 (×3): qty 1

## 2011-12-01 NOTE — Progress Notes (Signed)
   CARE MANAGEMENT NOTE 12/01/2011  Patient:  Francisco [name], Francisco [name]   Account Number:  [id number]  Date Initiated:  11/26/2011  Documentation initiated by:  GRAVES-BIGELOW,Jarmal Lewelling  Subjective/Objective Assessment:   Pt in with CP. Hx htn. Creatinine has finally declined. Plan to hold off on heart cath until creatinine has improved.     Action/Plan:   Pt is from home with wife Francisco [name] [phone]. Pt is plan for CABG either Wed or Thursday per wife. Pt may need HH services at d/c. Wife stated pt was ambulatory before admission.   Anticipated DC Date:  11/30/2011   Anticipated DC Plan:  [place]  CM consult      Choice offered to / List presented to:             Status of service:  In process, will continue to follow Medicare Important Message given?   (If response is "NO", the following Medicare IM given date fields will be blank) Date Medicare IM given:   Date Additional Medicare IM given:    Discharge Disposition:    Per UR Regulation:  Reviewed for med. necessity/level of care/duration of stay  Comments:  12-01-11 [address] Jacqlyn Krauss, [place] [phone] CM did speak to family and pt may need hh services at d/c. CM will continue to monitor for d/c disposition.  11-30-11 4pm [place] [phone] UR Completed - post cardiac cath today.  11-26-11 1646 Jacqlyn Krauss, RN,BSN [phone] CM will continue to moniotr for d/c disposition.

## 2011-12-01 NOTE — Progress Notes (Signed)
Patient to be transferred from 2915 to 2013 per order. Report given to [place], Therapist, sports. Patient AxOx3. VSS. Transfer via wheelchair. No c/o pain at present.

## 2011-12-01 NOTE — Progress Notes (Signed)
Subjective:  No chest pain, no dysuria, no cough or SOB  Objective:  Vital Signs in the last 24 hours: Temp:  [98.2 F (36.8 C)-100.4 F (38 C)] 100.4 F (38 C) (01/08 0500) Pulse Rate:  [74-80] 74  (01/07 1711) Resp:  [14-22] 16  (01/07 2325) BP: (93-132)/(60-81) 116/62 mmHg (01/08 0050) SpO2:  [94 %-100 %] 100 % (01/08 0500) Weight:  [73.5 kg (162 lb 0.6 oz)] 162 lb 0.6 oz (73.5 kg) (01/08 0700)  Intake/Output from previous day:  Intake/Output Summary (Last 24 hours) at 12/01/11 0736 Last data filed at 12/01/11 0100  Gross per 24 hour  Intake   1580 ml  Output   1150 ml  Net    430 ml    Physical Exam: General appearance: alert, cooperative and no distress Lungs: clear to auscultation bilaterally Heart: regular rate and rhythm Rt groin without hematoma or echymosis   Rate: 74  Rhythm: normal sinus rhythm  Lab Results:  Basename 12/01/11 0525 11/30/11 0605  WBC 13.4* 12.9*  HGB 10.2* 10.7*  PLT 205 230    Basename 12/01/11 0525 11/30/11 0605  NA 138 136  K 4.2 4.3  CL 105 103  CO2 22 21  GLUCOSE 88 111*  BUN 51* 61*  CREATININE 2.88* 2.80*   No results found for this basename: TROPONINI:2,CK,MB:2 in the last 72 hours Hepatic Function Panel  Basename 11/29/11 0625  PROT --  ALBUMIN 2.4*  AST --  ALT --  ALKPHOS --  BILITOT --  BILIDIR --  IBILI --   No results found for this basename: CHOL in the last 72 hours  Basename 11/30/11 0605  INR 1.07    Imaging: No results found.  Cardiac Studies:  Assessment/Plan:   Principal Problem:  *Acute respiratory failure requiring BiPap - Secondary to Acute Diastolic (+/- Systolic) CHF in setting of NSTEMI  Active Problems:  Fever, T 100.4, WBC 13k  NSTEMI (non-ST elevated myocardial infarction),acute MI  CAD (coronary artery disease), severe 3 vessel at cath 11/30/11  Alternating LBBB and RBBB  Renal failure, acute  Ischemic cardiomyopathy - Acute  Hypotension, transient  Tachycardia  HTN  (hypertension)  Dyslipidemia, LDL 180  Plan- check ua c+s cxr. Mr Gary wants to go home and come back for CABG. Will discuss with MD.  Kerin Ransom PA-C 12/01/2011, 7:36 AM  I have seen and examined the patient along with Kerin Ransom PA-C.  I have reviewed the chart, notes and new data.  I agree with PA's note.  Key new complaints: no angina, dyspnea  Key examination changes: no access site complications Key new findings / data: stable renal function parameters 24h after contrast exposure  PLAN: CABG later this week as long as stable renal parameters. Nuclear study suggests extensive anterior wall viability but probable inferior wall scar. Hopefully will see EF rebound some postop. Would do another echo before DC home. If EF still<35%, dc home with LifeVest as AICD will be delayed for 90 days after revascularization.  Sanda Klein, MD, [place] [phone] 12/01/2011, 8:07 AM

## 2011-12-01 NOTE — Progress Notes (Signed)
1 Day Post-Op Procedure(s) (LRB): LEFT HEART CATHETERIZATION WITH CORONARY ANGIOGRAM (N/A) Subjective: I feel like my old self today No CP or SOB  Objective: Vital signs in last 24 hours: Temp:  [97.6 F (36.4 C)-100.4 F (38 C)] 97.6 F (36.4 C) (01/08 1300) Pulse Rate:  [67-75] 67  (01/08 1300) Cardiac Rhythm:  [-] Normal sinus rhythm (01/08 1355) Resp:  [16-18] 18  (01/08 1300) BP: (101-138)/(60-80) 111/80 mmHg (01/08 1300) SpO2:  [95 %-100 %] 99 % (01/08 1300) Weight:  [73.5 kg (162 lb 0.6 oz)] 162 lb 0.6 oz (73.5 kg) (01/08 0700)  Hemodynamic parameters for last 24 hours:    Intake/Output from previous day: 01/07 0701 - 01/08 0700 In: 1580 [P.O.:360; I.V.:1220] Out: 1150 [Urine:1150] Intake/Output this shift: Total I/O In: 240 [P.O.:240] Out: 225 [Urine:225]  General appearance: alert and no distress  Lab Results:  [facility] 12/01/11 0525 11/30/11 0605  WBC 13.4* 12.9*  HGB 10.2* 10.7*  HCT 31.1* 32.5*  PLT 205 230   BMET:  Basename 12/01/11 0525 11/30/11 0605  NA 138 136  K 4.2 4.3  CL 105 103  CO2 22 21  GLUCOSE 88 111*  BUN 51* 61*  CREATININE 2.88* 2.80*  CALCIUM 8.6 8.8    PT/INR:  Basename 11/30/11 0605  LABPROT 14.1  INR 1.07   ABG    Component Value Date/Time   PHART 7.247* 11/21/2011 0853   HCO3 13.5* 11/21/2011 0853   TCO2 18 11/21/2011 0930   ACIDBASEDEF 12.0* 11/21/2011 0853   O2SAT 93.0 11/21/2011 0853   CBG (last 3)  No results found for this basename: GLUCAP:3 in the last 72 hours  Assessment/Plan: S/P Procedure(s) (LRB): LEFT HEART CATHETERIZATION WITH CORONARY ANGIOGRAM (N/A) 3 vessel CAD needs CABG Had temp of 100.4 this AM, U/A +, culture pending, WBC up to 13.2 K, will start empiric antibiotic awaiting culture, hopefully surgery later this week.   LOS: 10 days    HENDRICKSON,STEVEN C 12/01/2011

## 2011-12-02 ENCOUNTER — Inpatient Hospital Stay (HOSPITAL_COMMUNITY): Payer: Medicare Other

## 2011-12-02 LAB — CBC
HCT: 31 % — ABNORMAL LOW (ref 39.0–52.0)
Hemoglobin: 10 g/dL — ABNORMAL LOW (ref 13.0–17.0)
MCH: 27 pg (ref 26.0–34.0)
MCHC: 32.3 g/dL (ref 30.0–36.0)
MCV: 83.6 fL (ref 78.0–100.0)
Platelets: 215 10*3/uL (ref 150–400)
RBC: 3.71 MIL/uL — ABNORMAL LOW (ref 4.22–5.81)
RDW: 15.1 % (ref 11.5–15.5)
WBC: 8.7 10*3/uL (ref 4.0–10.5)

## 2011-12-02 LAB — DIFFERENTIAL
Basophils Absolute: 0 10*3/uL (ref 0.0–0.1)
Basophils Relative: 0 % (ref 0–1)
Eosinophils Absolute: 0.2 10*3/uL (ref 0.0–0.7)
Eosinophils Relative: 3 % (ref 0–5)
Lymphocytes Relative: 16 % (ref 12–46)
Lymphs Abs: 1.4 10*3/uL (ref 0.7–4.0)
Monocytes Absolute: 1.2 10*3/uL — ABNORMAL HIGH (ref 0.1–1.0)
Monocytes Relative: 14 % — ABNORMAL HIGH (ref 3–12)
Neutro Abs: 5.8 10*3/uL (ref 1.7–7.7)
Neutrophils Relative %: 66 % (ref 43–77)

## 2011-12-02 LAB — BLOOD GAS, ARTERIAL
Patient temperature: 98.6
pH, Arterial: 7.44 (ref 7.350–7.450)

## 2011-12-02 LAB — TYPE AND SCREEN
ABO/RH(D): O POS
Antibody Screen: NEGATIVE
Unit division: 0

## 2011-12-02 LAB — BASIC METABOLIC PANEL
BUN: 54 mg/dL — ABNORMAL HIGH (ref 6–23)
CO2: 21 mEq/L (ref 19–32)
Calcium: 9 mg/dL (ref 8.4–10.5)
Chloride: 104 mEq/L (ref 96–112)
Creatinine, Ser: 3.05 mg/dL — ABNORMAL HIGH (ref 0.50–1.35)
GFR calc Af Amer: 23 mL/min — ABNORMAL LOW (ref >90)
GFR calc non Af Amer: 20 mL/min — ABNORMAL LOW (ref >90)
Glucose, Bld: 120 mg/dL — ABNORMAL HIGH (ref 70–99)
Potassium: 4.4 mEq/L (ref 3.5–5.1)
Sodium: 137 mEq/L (ref 135–145)

## 2011-12-02 LAB — ABO/RH: ABO/RH(D): O POS

## 2011-12-02 MED ORDER — DOPAMINE-DEXTROSE 3.2-5 MG/ML-% IV SOLN
2.0000 ug/kg/min | INTRAVENOUS | Status: DC
  Filled 2011-12-02: qty 250

## 2011-12-02 MED ORDER — NITROGLYCERIN IN D5W 200-5 MCG/ML-% IV SOLN
2.0000 ug/min | INTRAVENOUS | Status: DC
  Filled 2011-12-02: qty 250

## 2011-12-02 MED ORDER — DEXTROSE 5 % IV SOLN
1.5000 g | INTRAVENOUS | Status: DC
  Filled 2011-12-02: qty 1.5

## 2011-12-02 MED ORDER — TEMAZEPAM 15 MG PO CAPS: 15.0000 mg | ORAL_CAPSULE | Freq: Once | ORAL | Status: AC | PRN

## 2011-12-02 MED ORDER — CHLORHEXIDINE GLUCONATE 4 % EX LIQD: 60.0000 mL | Freq: Once | CUTANEOUS | Status: DC

## 2011-12-02 MED ORDER — SODIUM CHLORIDE 0.9 % IV SOLN
INTRAVENOUS | Status: DC
  Filled 2011-12-02: qty 40

## 2011-12-02 MED ORDER — CHLORHEXIDINE GLUCONATE 4 % EX LIQD
60.0000 mL | Freq: Once | CUTANEOUS | Status: AC
  Administered 2011-12-02: 4 via TOPICAL
  Filled 2011-12-02 (×2): qty 60

## 2011-12-02 MED ORDER — PLASMA-LYTE 148 IV SOLN
INTRAVENOUS | Status: AC
  Administered 2011-12-03: 08:00:00
  Filled 2011-12-02: qty 0.5

## 2011-12-02 MED ORDER — SODIUM CHLORIDE 0.9 % IV SOLN
0.1000 ug/kg/h | INTRAVENOUS | Status: DC
  Filled 2011-12-02: qty 4

## 2011-12-02 MED ORDER — PHENYLEPHRINE HCL 10 MG/ML IJ SOLN
30.0000 ug/min | INTRAVENOUS | Status: DC
  Filled 2011-12-02: qty 2

## 2011-12-02 MED ORDER — BISACODYL 5 MG PO TBEC
5.0000 mg | DELAYED_RELEASE_TABLET | Freq: Once | ORAL | Status: DC
  Filled 2011-12-02: qty 2

## 2011-12-02 MED ORDER — CHLORHEXIDINE GLUCONATE 4 % EX LIQD
60.0000 mL | Freq: Once | CUTANEOUS | Status: DC
  Filled 2011-12-02: qty 60

## 2011-12-02 MED ORDER — EPINEPHRINE HCL 1 MG/ML IJ SOLN
0.5000 ug/min | INTRAVENOUS | Status: DC
  Filled 2011-12-02: qty 4

## 2011-12-02 MED ORDER — DIAZEPAM 5 MG PO TABS
5.0000 mg | ORAL_TABLET | Freq: Once | ORAL | Status: AC
  Administered 2011-12-03: 5 mg via ORAL
  Filled 2011-12-02: qty 1

## 2011-12-02 MED ORDER — DEXTROSE 5 % IV SOLN
750.0000 mg | INTRAVENOUS | Status: DC
  Filled 2011-12-02 (×2): qty 750

## 2011-12-02 MED ORDER — POTASSIUM CHLORIDE 2 MEQ/ML IV SOLN
80.0000 meq | INTRAVENOUS | Status: DC
  Filled 2011-12-02: qty 40

## 2011-12-02 MED ORDER — METOPROLOL TARTRATE 12.5 MG HALF TABLET
12.5000 mg | ORAL_TABLET | Freq: Once | ORAL | Status: AC
  Administered 2011-12-03: 12.5 mg via ORAL
  Filled 2011-12-02: qty 1

## 2011-12-02 MED ORDER — LORAZEPAM 0.5 MG PO TABS: 0.5000 mg | ORAL_TABLET | ORAL | Status: DC | PRN

## 2011-12-02 MED ORDER — SODIUM CHLORIDE 0.9 % IV SOLN
INTRAVENOUS | Status: DC
  Filled 2011-12-02: qty 1

## 2011-12-02 MED ORDER — MAGNESIUM SULFATE 50 % IJ SOLN
40.0000 meq | INTRAMUSCULAR | Status: DC
  Filled 2011-12-02: qty 10

## 2011-12-02 MED ORDER — VANCOMYCIN HCL 1000 MG IV SOLR
1250.0000 mg | INTRAVENOUS | Status: DC
  Filled 2011-12-02: qty 1250

## 2011-12-02 NOTE — Progress Notes (Signed)
Patient discussed at the Long Length of Stay [facility]abell Sabal [facility] 12/02/2011

## 2011-12-02 NOTE — Progress Notes (Signed)
Subjective: No complaints  Objective: Vital signs in last 24 hours: Temp:  [97.6 F (36.4 C)-98.2 F (36.8 C)] 97.8 F (36.6 C) (01/09 0544) Pulse Rate:  [67-75] 69  (01/09 0544) Resp:  [18-20] 20  (01/09 0544) BP: (111-142)/(63-90) 142/90 mmHg (01/09 0544) SpO2:  [96 %-99 %] 98 % (01/09 0544) Weight:  [73.211 kg (161 lb 6.4 oz)] 73.211 kg (161 lb 6.4 oz) (01/09 0544) Weight change: -0.289 kg (-10.2 oz) Last BM Date: 11/30/11 Intake/Output from previous day:-220 01/08 0701 - 01/09 0700 In: 960 [P.O.:960] Out: 225 [Urine:225] Intake/Output this shift:    PE: General: Heart: Lungs: Abd: Ext: Afebrile  Lab Results:  Basename 12/02/11 0558 12/01/11 0525  WBC 8.7 13.4*  HGB 10.0* 10.2*  HCT 31.0* 31.1*  PLT 215 205   BMET  Basename 12/02/11 0558 12/01/11 0525  NA 137 138  K 4.4 4.2  CL 104 105  CO2 21 22  GLUCOSE 120* 88  BUN 54* 51*  CREATININE 3.05* 2.88*  CALCIUM 9.0 8.6   No results found for this basename: TROPONINI:2,CK,MB:2 in the last 72 hours  Lab Results  Component Value Date   CHOL 263* 11/22/2011   HDL 72 11/22/2011   LDLCALC 180* 11/22/2011   TRIG 55 11/22/2011   CHOLHDL 3.7 11/22/2011   Lab Results  Component Value Date   HGBA1C 5.8* 11/21/2011     Lab Results  Component Value Date   TSH 1.985 11/21/2011    Hepatic Function Panel No results found for this basename: PROT,ALBUMIN,AST,ALT,ALKPHOS,BILITOT,BILIDIR,IBILI in the last 72 hours No results found for this basename: CHOL in the last 72 hours No results found for this basename: PROTIME in the last 72 hours    EKG: Orders placed during the hospital encounter of 11/21/11  . EKG 12-LEAD  . EKG 12-LEAD  . EKG 12-LEAD  . EKG 12-LEAD  . EKG  . EKG 12-LEAD  . EKG 12-LEAD  . EKG 12-LEAD  . EKG 12-LEAD  . EKG 12-LEAD  . EKG 12-LEAD  . EKG 12-LEAD  . EKG 12-LEAD    Studies/Results: Dg Chest 2 View  12/01/2011  *RADIOLOGY REPORT*  Clinical Data: Chest pain  CHEST - 2 VIEW   Comparison: 11/25/2011  Findings: Improved aeration of the lung bases since the prior study.  The lungs are now clear without infiltrate or effusion. Negative for heart failure.  Advanced degenerative change in both shoulder joints.  IMPRESSION: No active cardiopulmonary disease.  Resolution of previously identified bibasilar airspace disease.  Original Report Authenticated By: Truett Perna, M.D.    Medications: I have reviewed the patient's current medications.  Assessment/Plan: Patient Active Problem List  Diagnoses  . Acute respiratory failure requiring BiPap - Secondary to Acute Diastolic (+/- Systolic) CHF in setting of NSTEMI  . Tachycardia  . HTN (hypertension)  . NSTEMI (non-ST elevated myocardial infarction),acute MI  . Alternating LBBB and RBBB  . Renal failure, acute  . Ischemic cardiomyopathy - Acute  . Hypotension, transient  . Dyslipidemia, LDL 180  . CAD (coronary artery disease), severe 3 vessel at cath 11/30/11   PLAN:  Plan for CABG for  Severe CAD: Coronary Angiographic Data:  Left Main: No angiographic stenosis.  Left Anterior Descending (LAD): Calcified LAD with a 95% proximal stenosis, just distal to a large D1 branch. There is a discrete 80% mid-LAD stenosis at the D2 branch.  1st diagonal (D1): Large caliber branch which supplies a good portion of the lateral wall. There appears to be  a severe 80% stenosis of the proximal vessel with homocollaterals from the LCX system noted.  2nd diagonal (D2): Small vessel, appears to be co-located with an 80% mid LAD stenosis.  3rd diagonal (D3): Small vessel Circumflex (LCx): There is a tubular stenosis of the mid to distal LCx, after the take-off of a large OM1 branch.  1st obtuse marginal: Large branch with moderate to severe ostial and proximal lesions.  Right Coronary Artery: Calcified vessel with moderate proximal stenosis and a 95% distal stenosis just prior to the bifurcation of the PLB and PDA branches.  right  ventricle branch of right coronary artery: Large vessel with severe ostial stenosis.  posterior descending artery: Decent caliber vessel.  posterior lateral branch: May have mid vessel stenosis.   EF on nuc. Stress test 24%. Cr. Is more elevated today. On coreg, imdur.  Also statin.  Antibiotic added secondary to fever.  Now BP more elevated  ? Increase coreg?      LOS: 11 days   Francisco [name] 12/02/2011, 8:33 AM

## 2011-12-02 NOTE — Progress Notes (Signed)
2 Days Post-Op Procedure(s) (LRB): LEFT HEART CATHETERIZATION WITH CORONARY ANGIOGRAM (N/A) Subjective: No [name]/o today, denies fever, CP, SOB  Objective: Vital signs in last 24 hours: Temp:  [97.8 F (36.6 [name])-98.4 F (36.9 [name])] 98.4 F (36.9 [name]) (01/09 1334) Pulse Rate:  [69-71] 69  (01/09 1334) Cardiac Rhythm:  [-] Normal sinus rhythm (01/09 0829) Resp:  [18-20] 18  (01/09 1334) BP: (120-142)/(70-90) 120/74 mmHg (01/09 1334) SpO2:  [98 %-99 %] 99 % (01/09 1334) Weight:  [73.211 kg (161 lb 6.4 oz)] 73.211 kg (161 lb 6.4 oz) (01/09 0544)  Hemodynamic parameters for last 24 hours:    Intake/Output from previous day: 01/08 0701 - 01/09 0700 In: 960 [P.O.:960] Out: 225 [Urine:225] Intake/Output this shift:    General appearance: alert and no distress Neurologic: intact Heart: regular rate and rhythm  Lab Results:  [facility] 12/02/11 0558 12/01/11 0525  WBC 8.7 13.4*  HGB 10.0* 10.2*  HCT 31.0* 31.1*  PLT 215 205   BMET:  Basename 12/02/11 0558 12/01/11 0525  NA 137 138  K 4.4 4.2  CL 104 105  CO2 21 22  GLUCOSE 120* 88  BUN 54* 51*  CREATININE 3.05* 2.88*  CALCIUM 9.0 8.6    PT/INR:  Basename 11/30/11 0605  LABPROT 14.1  INR 1.07   ABG    Component Value Date/Time   PHART 7.247* 11/21/2011 0853   HCO3 13.5* 11/21/2011 0853   TCO2 18 11/21/2011 0930   ACIDBASEDEF 12.0* 11/21/2011 0853   O2SAT 93.0 11/21/2011 0853   CBG (last 3)  No results found for this basename: GLUCAP:3 in the last 72 hours  Assessment/Plan: S/P Procedure(s) (LRB): LEFT HEART CATHETERIZATION WITH CORONARY ANGIOGRAM (N/A) 3 vessel CAD, mild to moderate MR, severe ischemic cardiomyopathy He has been afebrile, and WBC back to normal, so I believe it's safe to proceed with surgery. His creatinine is up slightly from 2.9 to 3.05, but is about midrange for his hospitalization, I doubt we'll see significant improvement in that with further delay. For CABG, possible mitral repair in AM 1/10-  pt and family are aware of the high risk nature of the procedure, especially the high risk of needing dialysis. He accepts the risks and agrees to proceed.   LOS: 11 days    Francisco [name] 12/02/2011

## 2011-12-02 NOTE — Progress Notes (Signed)
[facility]  DAILY PROGRESS NOTE   Subjective:  WBC count has normalized today. Afebrile overnight. Urine culture pending. He denies cough or dysuria.  Objective:  Temp:  [97.6 F (36.4 C)-98.2 F (36.8 C)] 97.8 F (36.6 C) (01/09 0544) Pulse Rate:  [67-75] 69  (01/09 0544) Resp:  [18-20] 20  (01/09 0544) BP: (111-142)/(63-90) 142/90 mmHg (01/09 0544) SpO2:  [96 %-99 %] 98 % (01/09 0544) Weight:  [73.211 kg (161 lb 6.4 oz)] 73.211 kg (161 lb 6.4 oz) (01/09 0544) Weight change: -0.289 kg (-10.2 oz)  Intake/Output from previous day: 01/08 0701 - 01/09 0700 In: 960 [P.O.:960] Out: 225 [Urine:225]  Intake/Output from this shift:    Medications: Current Facility-Administered Medications  Medication Dose Route Frequency Provider Last Rate Last Dose  . 0.9 %  sodium chloride infusion  250 mL Intravenous PRN Erlene Quan, PA      . aspirin EC tablet 81 mg  81 mg Oral Daily Baruch Gouty, PHARMD   81 mg at 12/01/11 1053  . carvedilol (COREG) tablet 6.25 mg  6.25 mg Oral BID WC Leonie Green Harding   6.25 mg at 12/02/11 0541  . ciprofloxacin (CIPRO) tablet 500 mg  500 mg Oral Q2000 Melrose Nakayama, MD   500 mg at 12/01/11 2133  . heparin injection 5,000 Units  5,000 Units Subcutaneous Q8H Doree Fudge, MD   5,000 Units at 12/02/11 0539  . isosorbide mononitrate (IMDUR) 24 hr tablet 30 mg  30 mg Oral Daily Donetta Potts, MD   30 mg at 12/01/11 0105  . morphine 2 MG/ML injection 2 mg  2 mg Intravenous Q2H PRN Isaiah Serge, NP   2 mg at 11/21/11 1600  . nitroGLYCERIN (NITROSTAT) SL tablet 0.4 mg  0.4 mg Sublingual Q5 min PRN Isaiah Serge, NP      . pantoprazole (PROTONIX) EC tablet 40 mg  40 mg Oral Q0600 Doreene Burke [place], [place]   40 mg at 12/02/11 0540  . rosuvastatin (CRESTOR) tablet 20 mg  20 mg Oral q1800 Doreene Burke [place], [place]   20 mg at 12/01/11 1716  . sodium chloride 0.9 % injection 3 mL  3 mL Intravenous PRN Erlene Quan, PA      .  zolpidem (AMBIEN) tablet 5 mg  5 mg Oral QHS PRN Isaiah Serge, NP      . DISCONTD: acetaminophen (TYLENOL) tablet 650 mg  650 mg Oral Q4H PRN Pixie Casino      . DISCONTD: ondansetron (ZOFRAN) injection 4 mg  4 mg Intravenous Q6H PRN Pixie Casino      . DISCONTD: sodium chloride 0.9 % injection 3 mL  3 mL Intravenous Q12H Erlene Quan, PA   3 mL at 12/01/11 1053    Physical Exam: General appearance: alert and no distress Neck: no adenopathy, no carotid bruit, no JVD, supple, symmetrical, trachea midline and thyroid not enlarged, symmetric, no tenderness/mass/nodules Lungs: clear to auscultation bilaterally Heart: regular rate and rhythm, S1, S2 normal, no murmur, click, rub or gallop Abdomen: soft, non-tender; bowel sounds normal; no masses,  no organomegaly Extremities: extremities normal, atraumatic, no cyanosis or edema Neurologic: Grossly normal  Lab Results: Results for orders placed during the hospital encounter of 11/21/11 (from the past 48 hour(s))  APTT     Status: Normal   Collection Time   11/30/11  2:53 PM      Component Value Range Comment   aPTT  25  24 - 37 (seconds)   CBC     Status: Abnormal   Collection Time   12/01/11  5:25 AM      Component Value Range Comment   WBC 13.4 (*) 4.0 - 10.5 (K/uL)    RBC 3.73 (*) 4.22 - 5.81 (MIL/uL)    Hemoglobin 10.2 (*) 13.0 - 17.0 (g/dL)    HCT 31.1 (*) 39.0 - 52.0 (%)    MCV 83.4  78.0 - 100.0 (fL)    MCH 27.3  26.0 - 34.0 (pg)    MCHC 32.8  30.0 - 36.0 (g/dL)    RDW 15.2  11.5 - 15.5 (%)    Platelets 205  150 - 400 (K/uL)   BASIC METABOLIC PANEL     Status: Abnormal   Collection Time   12/01/11  5:25 AM      Component Value Range Comment   Sodium 138  135 - 145 (mEq/L)    Potassium 4.2  3.5 - 5.1 (mEq/L)    Chloride 105  96 - 112 (mEq/L)    CO2 22  19 - 32 (mEq/L)    Glucose, Bld 88  70 - 99 (mg/dL)    BUN 51 (*) 6 - 23 (mg/dL)    Creatinine, Ser 2.88 (*) 0.50 - 1.35 (mg/dL)    Calcium 8.6  8.4 - 10.5 (mg/dL)     GFR calc non Af Amer 21 (*) >90 (mL/min)    GFR calc Af Amer 25 (*) >90 (mL/min)   CBC     Status: Abnormal   Collection Time   12/02/11  5:58 AM      Component Value Range Comment   WBC 8.7  4.0 - 10.5 (K/uL)    RBC 3.71 (*) 4.22 - 5.81 (MIL/uL)    Hemoglobin 10.0 (*) 13.0 - 17.0 (g/dL)    HCT 31.0 (*) 39.0 - 52.0 (%)    MCV 83.6  78.0 - 100.0 (fL)    MCH 27.0  26.0 - 34.0 (pg)    MCHC 32.3  30.0 - 36.0 (g/dL)    RDW 15.1  11.5 - 15.5 (%)    Platelets 215  150 - 400 (K/uL)   DIFFERENTIAL     Status: Abnormal   Collection Time   12/02/11  5:58 AM      Component Value Range Comment   Neutrophils Relative 66  43 - 77 (%)    Neutro Abs 5.8  1.7 - 7.7 (K/uL)    Lymphocytes Relative 16  12 - 46 (%)    Lymphs Abs 1.4  0.7 - 4.0 (K/uL)    Monocytes Relative 14 (*) 3 - 12 (%)    Monocytes Absolute 1.2 (*) 0.1 - 1.0 (K/uL)    Eosinophils Relative 3  0 - 5 (%)    Eosinophils Absolute 0.2  0.0 - 0.7 (K/uL)    Basophils Relative 0  0 - 1 (%)    Basophils Absolute 0.0  0.0 - 0.1 (K/uL)   BASIC METABOLIC PANEL     Status: Abnormal   Collection Time   12/02/11  5:58 AM      Component Value Range Comment   Sodium 137  135 - 145 (mEq/L)    Potassium 4.4  3.5 - 5.1 (mEq/L)    Chloride 104  96 - 112 (mEq/L)    CO2 21  19 - 32 (mEq/L)    Glucose, Bld 120 (*) 70 - 99 (mg/dL)    BUN 54 (*) 6 -  23 (mg/dL)    Creatinine, Ser 3.05 (*) 0.50 - 1.35 (mg/dL)    Calcium 9.0  8.4 - 10.5 (mg/dL)    GFR calc non Af Amer 20 (*) >90 (mL/min)    GFR calc Af Amer 23 (*) >90 (mL/min)     Imaging: Dg Chest 2 View  12/01/2011  *RADIOLOGY REPORT*  Clinical Data: Chest pain  CHEST - 2 VIEW  Comparison: 11/25/2011  Findings: Improved aeration of the lung bases since the prior study.  The lungs are now clear without infiltrate or effusion. Negative for heart failure.  Advanced degenerative change in both shoulder joints.  IMPRESSION: No active cardiopulmonary disease.  Resolution of previously identified bibasilar  airspace disease.  Original Report Authenticated By: Truett Perna, M.D.    Assessment:  1. Principal Problem: 2.  *Acute respiratory failure requiring BiPap - Secondary to Acute Diastolic (+/- Systolic) CHF in setting of NSTEMI 3. Active Problems: 4.  Tachycardia 5.  HTN (hypertension) 6.  NSTEMI (non-ST elevated myocardial infarction),acute MI 7.  Alternating LBBB and RBBB 8.  Renal failure, acute 9.  Ischemic cardiomyopathy - Acute 10.  Hypotension, transient 11.  Dyslipidemia, LDL 180 12.  CAD (coronary artery disease), severe 3 vessel at cath 11/30/11 13.   Plan:  1. Complete empiric 3 day course of cipro.  ?source, dentition appears okay.  He denies dysuria or cough or abdominal pain. 2. Renal function stable. 3. Hopefully for CABG later this week.  Time Spent Directly with Patient:  15 minutes  Length of Stay:  LOS: 11 days   Pixie Casino, MD Attending Cardiologist [facility] 12/02/2011, 8:32 AM

## 2011-12-02 NOTE — Progress Notes (Signed)
Pt. Seen and examined. Agree with the NP/PA-C note as written.  See my note from today.  Pixie Casino, MD Attending Cardiologist [facility]

## 2011-12-03 ENCOUNTER — Encounter (HOSPITAL_COMMUNITY): Payer: Self-pay | Admitting: Anesthesiology

## 2011-12-03 ENCOUNTER — Inpatient Hospital Stay (HOSPITAL_COMMUNITY): Payer: Medicare Other

## 2011-12-03 ENCOUNTER — Other Ambulatory Visit: Payer: Self-pay

## 2011-12-03 ENCOUNTER — Inpatient Hospital Stay (HOSPITAL_COMMUNITY): Payer: Medicare Other | Admitting: Anesthesiology

## 2011-12-03 ENCOUNTER — Encounter (HOSPITAL_COMMUNITY)
Admission: EM | Disposition: A | Discharge status: Home / Self Care | Payer: Self-pay | Source: Home / Self Care | Attending: Cardiovascular Disease

## 2011-12-03 DIAGNOSIS — I2510 Atherosclerotic heart disease of native coronary artery without angina pectoris: Secondary | ICD-10-CM

## 2011-12-03 HISTORY — PX: CORONARY ARTERY BYPASS GRAFT: SHX141

## 2011-12-03 LAB — POCT I-STAT 4, (NA,K, GLUC, HGB,HCT)
Glucose, Bld: 103 mg/dL — ABNORMAL HIGH (ref 70–99)
Glucose, Bld: 95 mg/dL (ref 70–99)
Glucose, Bld: 106 mg/dL — ABNORMAL HIGH (ref 70–99)
Glucose, Bld: 84 mg/dL (ref 70–99)
HCT: 25 % — ABNORMAL LOW (ref 39.0–52.0)
HCT: 24 % — ABNORMAL LOW (ref 39.0–52.0)
HCT: 25 % — ABNORMAL LOW (ref 39.0–52.0)
Hemoglobin: 8.5 g/dL — ABNORMAL LOW (ref 13.0–17.0)
Hemoglobin: 8.2 g/dL — ABNORMAL LOW (ref 13.0–17.0)
Hemoglobin: 8.5 g/dL — ABNORMAL LOW (ref 13.0–17.0)
Potassium: 4.3 mEq/L (ref 3.5–5.1)
Potassium: 4 mEq/L (ref 3.5–5.1)
Sodium: 141 mEq/L (ref 135–145)
Sodium: 141 mEq/L (ref 135–145)
Sodium: 139 mEq/L (ref 135–145)
Sodium: 144 mEq/L (ref 135–145)

## 2011-12-03 LAB — BASIC METABOLIC PANEL
BUN: 52 mg/dL — ABNORMAL HIGH (ref 6–23)
GFR calc Af Amer: 23 mL/min — ABNORMAL LOW (ref >90)
GFR calc non Af Amer: 20 mL/min — ABNORMAL LOW (ref >90)
Potassium: 4.1 mEq/L (ref 3.5–5.1)
Sodium: 137 mEq/L (ref 135–145)

## 2011-12-03 LAB — POCT I-STAT 3, ART BLOOD GAS (G3+)
Acid-base deficit: 8 mmol/L — ABNORMAL HIGH (ref 0.0–2.0)
Acid-base deficit: 6 mmol/L — ABNORMAL HIGH (ref 0.0–2.0)
Bicarbonate: 20.5 mEq/L (ref 20.0–24.0)
O2 Saturation: 100 %
O2 Saturation: 94 %
O2 Saturation: 99 %
O2 Saturation: 97 %
Patient temperature: 35
TCO2: 22 mmol/L (ref 0–100)
pCO2 arterial: 41.2 mmHg (ref 35.0–45.0)
pCO2 arterial: 31.6 mmHg — ABNORMAL LOW (ref 35.0–45.0)
pCO2 arterial: 33.5 mmHg — ABNORMAL LOW (ref 35.0–45.0)
pCO2 arterial: 34 mmHg — ABNORMAL LOW (ref 35.0–45.0)
pCO2 arterial: 34.4 mmHg — ABNORMAL LOW (ref 35.0–45.0)
pH, Arterial: 7.305 — ABNORMAL LOW (ref 7.350–7.450)
pH, Arterial: 7.387 (ref 7.350–7.450)
pH, Arterial: 7.346 — ABNORMAL LOW (ref 7.350–7.450)
pO2, Arterial: 406 mmHg — ABNORMAL HIGH (ref 80.0–100.0)
pO2, Arterial: 122 mmHg — ABNORMAL HIGH (ref 80.0–100.0)
pO2, Arterial: 76 mmHg — ABNORMAL LOW (ref 80.0–100.0)
pO2, Arterial: 98 mmHg (ref 80.0–100.0)

## 2011-12-03 LAB — MAGNESIUM: Magnesium: 2.1 mg/dL (ref 1.5–2.5)

## 2011-12-03 LAB — CBC
HCT: 24.3 % — ABNORMAL LOW (ref 39.0–52.0)
Hemoglobin: 8.2 g/dL — ABNORMAL LOW (ref 13.0–17.0)
MCH: 27.4 pg (ref 26.0–34.0)
MCH: 27.8 pg (ref 26.0–34.0)
MCHC: 32.5 g/dL (ref 30.0–36.0)
MCHC: 33.7 g/dL (ref 30.0–36.0)
MCV: 81.3 fL (ref 78.0–100.0)
MCV: 81.4 fL (ref 78.0–100.0)
Platelets: 126 10*3/uL — ABNORMAL LOW (ref 150–400)
RBC: 2.63 MIL/uL — ABNORMAL LOW (ref 4.22–5.81)
RDW: 15 % (ref 11.5–15.5)
RDW: 14.7 % (ref 11.5–15.5)
RDW: 15.1 % (ref 11.5–15.5)
WBC: 8.7 10*3/uL (ref 4.0–10.5)

## 2011-12-03 LAB — POCT I-STAT, CHEM 8
Calcium, Ion: 1.06 mmol/L — ABNORMAL LOW (ref 1.12–1.32)
Chloride: 115 mEq/L — ABNORMAL HIGH (ref 96–112)
Glucose, Bld: 116 mg/dL — ABNORMAL HIGH (ref 70–99)
HCT: 30 % — ABNORMAL LOW (ref 39.0–52.0)
Hemoglobin: 10.2 g/dL — ABNORMAL LOW (ref 13.0–17.0)

## 2011-12-03 LAB — GLUCOSE, CAPILLARY: Glucose-Capillary: 80 mg/dL (ref 70–99)

## 2011-12-03 LAB — CREATININE, SERUM
GFR calc Af Amer: 29 mL/min — ABNORMAL LOW (ref >90)
GFR calc non Af Amer: 25 mL/min — ABNORMAL LOW (ref >90)

## 2011-12-03 LAB — POCT I-STAT GLUCOSE: Glucose, Bld: 97 mg/dL (ref 70–99)

## 2011-12-03 LAB — HEMOGLOBIN A1C: Mean Plasma Glucose: 131 mg/dL — ABNORMAL HIGH (ref <117)

## 2011-12-03 SURGERY — CORONARY ARTERY BYPASS GRAFTING (CABG)
Anesthesia: General | Site: Chest | Wound class: Clean

## 2011-12-03 MED ORDER — NITROGLYCERIN IN D5W 200-5 MCG/ML-% IV SOLN: 0.0000 ug/min | INTRAVENOUS | Status: DC

## 2011-12-03 MED ORDER — ACETAMINOPHEN 650 MG RE SUPP
650.0000 mg | RECTAL | Status: AC
  Administered 2011-12-03: 650 mg via RECTAL

## 2011-12-03 MED ORDER — NALOXONE HCL 0.4 MG/ML IJ SOLN
INTRAMUSCULAR | Status: AC
  Administered 2011-12-03: 0.2 mg
  Filled 2011-12-03: qty 1

## 2011-12-03 MED ORDER — ASPIRIN EC 325 MG PO TBEC
325.0000 mg | DELAYED_RELEASE_TABLET | Freq: Every day | ORAL | Status: DC
  Administered 2011-12-04 – 2011-12-11 (×8): 325 mg via ORAL
  Filled 2011-12-03 (×8): qty 1

## 2011-12-03 MED ORDER — PAPAVERINE HCL 30 MG/ML IJ SOLN
INTRAMUSCULAR | Status: DC | PRN
  Administered 2011-12-03: 60 mg via INTRAVENOUS

## 2011-12-03 MED ORDER — SODIUM CHLORIDE 0.9 % IV SOLN: 250.0000 mL | INTRAVENOUS | Status: DC

## 2011-12-03 MED ORDER — AMINOCAPROIC ACID 250 MG/ML IV SOLN
10.0000 g | INTRAVENOUS | Status: DC | PRN
  Administered 2011-12-03: 5 g/h via INTRAVENOUS

## 2011-12-03 MED ORDER — INSULIN REGULAR BOLUS VIA INFUSION
0.0000 [IU] | Freq: Three times a day (TID) | INTRAVENOUS | Status: DC
  Filled 2011-12-03 (×14): qty 10

## 2011-12-03 MED ORDER — DOCUSATE SODIUM 100 MG PO CAPS
200.0000 mg | ORAL_CAPSULE | Freq: Every day | ORAL | Status: DC
  Administered 2011-12-04 – 2011-12-11 (×8): 200 mg via ORAL
  Filled 2011-12-03 (×9): qty 2

## 2011-12-03 MED ORDER — MIDAZOLAM HCL 2 MG/2ML IJ SOLN: 2.0000 mg | INTRAMUSCULAR | Status: DC | PRN

## 2011-12-03 MED ORDER — SODIUM CHLORIDE 0.9 % IJ SOLN
3.0000 mL | Freq: Two times a day (BID) | INTRAMUSCULAR | Status: DC
  Administered 2011-12-04 – 2011-12-07 (×7): 3 mL via INTRAVENOUS

## 2011-12-03 MED ORDER — DEXTROSE 50 % IV SOLN
INTRAVENOUS | Status: AC
  Administered 2011-12-03: 14 mL
  Filled 2011-12-03: qty 50

## 2011-12-03 MED ORDER — FENTANYL CITRATE 0.05 MG/ML IJ SOLN
INTRAMUSCULAR | Status: DC | PRN
  Administered 2011-12-03: 50 ug via INTRAVENOUS
  Administered 2011-12-03 (×6): 250 ug via INTRAVENOUS
  Administered 2011-12-03: 200 ug via INTRAVENOUS
  Administered 2011-12-03: 250 ug via INTRAVENOUS

## 2011-12-03 MED ORDER — MOXIFLOXACIN HCL IN NACL 400 MG/250ML IV SOLN
400.0000 mg | INTRAVENOUS | Status: AC
  Administered 2011-12-03: 400 mg via INTRAVENOUS
  Filled 2011-12-03: qty 250

## 2011-12-03 MED ORDER — DOPAMINE-DEXTROSE 3.2-5 MG/ML-% IV SOLN
2.0000 ug/kg/min | INTRAVENOUS | Status: DC
  Administered 2011-12-05: 3 ug/kg/min via INTRAVENOUS
  Filled 2011-12-03: qty 250

## 2011-12-03 MED ORDER — VANCOMYCIN HCL 1000 MG IV SOLR
1000.0000 mg | INTRAVENOUS | Status: DC | PRN
  Administered 2011-12-03: 1250 mg via INTRAVENOUS

## 2011-12-03 MED ORDER — ONDANSETRON HCL 4 MG/2ML IJ SOLN
4.0000 mg | Freq: Four times a day (QID) | INTRAMUSCULAR | Status: DC | PRN
  Filled 2011-12-03: qty 2

## 2011-12-03 MED ORDER — SODIUM CHLORIDE 0.9 % IV SOLN
200.0000 ug | INTRAVENOUS | Status: DC | PRN
  Administered 2011-12-03: 0.2 ug/kg/h via INTRAVENOUS

## 2011-12-03 MED ORDER — SODIUM CHLORIDE 0.9 % IV SOLN
INTRAVENOUS | Status: DC | PRN
  Administered 2011-12-03 (×2): via INTRAVENOUS

## 2011-12-03 MED ORDER — INSULIN ASPART 100 UNIT/ML ~~LOC~~ SOLN: 0.0000 [IU] | SUBCUTANEOUS | Status: DC

## 2011-12-03 MED ORDER — SODIUM CHLORIDE 0.9 % IV SOLN
INTRAVENOUS | Status: DC | PRN
  Administered 2011-12-03 (×2): via INTRAVENOUS

## 2011-12-03 MED ORDER — HEMOSTATIC AGENTS (NO CHARGE) OPTIME
TOPICAL | Status: DC | PRN
  Administered 2011-12-03: 1

## 2011-12-03 MED ORDER — ACETAMINOPHEN 160 MG/5ML PO SOLN: 650.0000 mg | ORAL | Status: AC

## 2011-12-03 MED ORDER — ARTIFICIAL TEARS OP OINT
TOPICAL_OINTMENT | OPHTHALMIC | Status: DC | PRN
  Administered 2011-12-03: 1 via OPHTHALMIC

## 2011-12-03 MED ORDER — FAMOTIDINE IN NACL 20-0.9 MG/50ML-% IV SOLN
20.0000 mg | Freq: Two times a day (BID) | INTRAVENOUS | Status: AC
  Administered 2011-12-03: 20 mg via INTRAVENOUS

## 2011-12-03 MED ORDER — MORPHINE SULFATE 2 MG/ML IJ SOLN
1.0000 mg | INTRAMUSCULAR | Status: AC | PRN
  Administered 2011-12-03: 2 mg via INTRAVENOUS
  Filled 2011-12-03: qty 1

## 2011-12-03 MED ORDER — LACTATED RINGERS IV SOLN: INTRAVENOUS | Status: DC

## 2011-12-03 MED ORDER — ACETAMINOPHEN 160 MG/5ML PO SOLN
975.0000 mg | Freq: Four times a day (QID) | ORAL | Status: AC
  Administered 2011-12-04: 975 mg
  Filled 2011-12-03 (×2): qty 40.6

## 2011-12-03 MED ORDER — PHENYLEPHRINE HCL 10 MG/ML IJ SOLN
0.0000 ug/min | INTRAVENOUS | Status: DC
  Filled 2011-12-03: qty 2

## 2011-12-03 MED ORDER — LACTATED RINGERS IV SOLN: 500.0000 mL | Freq: Once | INTRAVENOUS | Status: AC | PRN

## 2011-12-03 MED ORDER — VANCOMYCIN HCL 1000 MG IV SOLR
1000.0000 mg | Freq: Once | INTRAVENOUS | Status: AC
  Administered 2011-12-03: 1000 mg via INTRAVENOUS
  Filled 2011-12-03: qty 1000

## 2011-12-03 MED ORDER — PANTOPRAZOLE SODIUM 40 MG PO TBEC
40.0000 mg | DELAYED_RELEASE_TABLET | Freq: Every day | ORAL | Status: DC
  Administered 2011-12-05 – 2011-12-10 (×6): 40 mg via ORAL
  Filled 2011-12-03 (×6): qty 1

## 2011-12-03 MED ORDER — ACETAMINOPHEN 500 MG PO TABS
1000.0000 mg | ORAL_TABLET | Freq: Four times a day (QID) | ORAL | Status: AC
  Administered 2011-12-04 – 2011-12-08 (×18): 1000 mg via ORAL
  Filled 2011-12-03 (×18): qty 2

## 2011-12-03 MED ORDER — SODIUM CHLORIDE 0.9 % IJ SOLN: 3.0000 mL | INTRAMUSCULAR | Status: DC | PRN

## 2011-12-03 MED ORDER — MAGNESIUM SULFATE 40 MG/ML IJ SOLN: 4.0000 g | Freq: Once | INTRAMUSCULAR | Status: DC

## 2011-12-03 MED ORDER — ALBUMIN HUMAN 5 % IV SOLN
250.0000 mL | INTRAVENOUS | Status: AC | PRN
  Administered 2011-12-04 (×2): 250 mL via INTRAVENOUS

## 2011-12-03 MED ORDER — SODIUM CHLORIDE 0.9 % IV SOLN
100.0000 [IU] | INTRAVENOUS | Status: DC | PRN
  Administered 2011-12-03: 1.4 [IU]/h via INTRAVENOUS

## 2011-12-03 MED ORDER — SODIUM CHLORIDE 0.45 % IV SOLN
INTRAVENOUS | Status: DC
  Administered 2011-12-03: 20 mL via INTRAVENOUS

## 2011-12-03 MED ORDER — SODIUM CHLORIDE 0.9 % IV SOLN
INTRAVENOUS | Status: DC
  Administered 2011-12-03: 20 mL via INTRAVENOUS

## 2011-12-03 MED ORDER — NITROGLYCERIN IN D5W 200-5 MCG/ML-% IV SOLN
INTRAVENOUS | Status: DC | PRN
  Administered 2011-12-03: 5 ug/min via INTRAVENOUS

## 2011-12-03 MED ORDER — SODIUM CHLORIDE 0.9 % IV SOLN
0.1000 ug/kg/h | INTRAVENOUS | Status: DC
  Filled 2011-12-03 (×2): qty 2

## 2011-12-03 MED ORDER — POTASSIUM CHLORIDE 10 MEQ/50ML IV SOLN: 10.0000 meq | INTRAVENOUS | Status: AC

## 2011-12-03 MED ORDER — ASPIRIN 81 MG PO CHEW: 324.0000 mg | CHEWABLE_TABLET | Freq: Every day | ORAL | Status: DC

## 2011-12-03 MED ORDER — METOPROLOL TARTRATE 25 MG/10 ML ORAL SUSPENSION
12.5000 mg | Freq: Two times a day (BID) | ORAL | Status: DC
  Filled 2011-12-03 (×3): qty 5

## 2011-12-03 MED ORDER — FENTANYL CITRATE 0.05 MG/ML IJ SOLN: 50.0000 ug | INTRAMUSCULAR | Status: DC | PRN

## 2011-12-03 MED ORDER — PHENYLEPHRINE HCL 10 MG/ML IJ SOLN
10.0000 mg | INTRAVENOUS | Status: DC | PRN
  Administered 2011-12-03: 10 ug/min via INTRAVENOUS

## 2011-12-03 MED ORDER — METOPROLOL TARTRATE 12.5 MG HALF TABLET
12.5000 mg | ORAL_TABLET | Freq: Two times a day (BID) | ORAL | Status: DC
  Filled 2011-12-03 (×3): qty 1

## 2011-12-03 MED ORDER — PROTAMINE SULFATE 10 MG/ML IV SOLN
INTRAVENOUS | Status: DC | PRN
  Administered 2011-12-03 (×2): 50 mg via INTRAVENOUS
  Administered 2011-12-03: 100 mg via INTRAVENOUS
  Administered 2011-12-03: 50 mg via INTRAVENOUS

## 2011-12-03 MED ORDER — HEPARIN SODIUM (PORCINE) 1000 UNIT/ML IJ SOLN
INTRAMUSCULAR | Status: DC | PRN
  Administered 2011-12-03: 43000 [IU] via INTRAVENOUS
  Administered 2011-12-03: 2000 [IU] via INTRAVENOUS

## 2011-12-03 MED ORDER — ETOMIDATE 2 MG/ML IV SOLN
INTRAVENOUS | Status: DC | PRN
  Administered 2011-12-03: 30 mg via INTRAVENOUS

## 2011-12-03 MED ORDER — ROCURONIUM BROMIDE 100 MG/10ML IV SOLN
INTRAVENOUS | Status: DC | PRN
  Administered 2011-12-03: 50 mg via INTRAVENOUS

## 2011-12-03 MED ORDER — OXYCODONE HCL 5 MG PO TABS
5.0000 mg | ORAL_TABLET | ORAL | Status: DC | PRN
  Administered 2011-12-04: 5 mg via ORAL
  Administered 2011-12-04 (×2): 10 mg via ORAL
  Administered 2011-12-04 – 2011-12-05 (×3): 5 mg via ORAL
  Filled 2011-12-03: qty 2
  Filled 2011-12-03: qty 1
  Filled 2011-12-03: qty 2
  Filled 2011-12-03 (×3): qty 1

## 2011-12-03 MED ORDER — 0.9 % SODIUM CHLORIDE (POUR BTL) OPTIME
TOPICAL | Status: DC | PRN
  Administered 2011-12-03: 6000 mL

## 2011-12-03 MED ORDER — ALBUMIN HUMAN 5 % IV SOLN
INTRAVENOUS | Status: DC | PRN
  Administered 2011-12-03 (×2): via INTRAVENOUS

## 2011-12-03 MED ORDER — INSULIN REGULAR HUMAN 100 UNIT/ML IJ SOLN
INTRAMUSCULAR | Status: DC
  Filled 2011-12-03: qty 1

## 2011-12-03 MED ORDER — MIDAZOLAM HCL 2 MG/2ML IJ SOLN: 1.0000 mg | INTRAMUSCULAR | Status: DC | PRN

## 2011-12-03 MED ORDER — BISACODYL 5 MG PO TBEC
10.0000 mg | DELAYED_RELEASE_TABLET | Freq: Every day | ORAL | Status: DC
  Administered 2011-12-04 – 2011-12-09 (×6): 10 mg via ORAL
  Filled 2011-12-03 (×4): qty 2

## 2011-12-03 MED ORDER — METOPROLOL TARTRATE 1 MG/ML IV SOLN: 2.5000 mg | INTRAVENOUS | Status: DC | PRN

## 2011-12-03 MED ORDER — HETASTARCH-ELECTROLYTES 6 % IV SOLN
INTRAVENOUS | Status: DC | PRN
  Administered 2011-12-03 (×2): via INTRAVENOUS

## 2011-12-03 MED ORDER — SODIUM BICARBONATE 8.4 % IV SOLN
50.0000 meq | Freq: Once | INTRAVENOUS | Status: AC
  Administered 2011-12-03: 50 meq via INTRAVENOUS

## 2011-12-03 MED ORDER — DOPAMINE-DEXTROSE 1.6-5 MG/ML-% IV SOLN
INTRAVENOUS | Status: DC | PRN
  Administered 2011-12-03: 3 ug/kg/min via INTRAVENOUS

## 2011-12-03 MED ORDER — INSULIN ASPART 100 UNIT/ML ~~LOC~~ SOLN
0.0000 [IU] | SUBCUTANEOUS | Status: AC
  Administered 2011-12-03: 2 [IU] via SUBCUTANEOUS
  Filled 2011-12-03: qty 3

## 2011-12-03 MED ORDER — DEXTROSE 5 % IV SOLN
1.5000 g | INTRAVENOUS | Status: DC | PRN
  Administered 2011-12-03: 1.5 g via INTRAVENOUS

## 2011-12-03 MED ORDER — BISACODYL 10 MG RE SUPP: 10.0000 mg | Freq: Every day | RECTAL | Status: DC

## 2011-12-03 MED ORDER — MORPHINE SULFATE 4 MG/ML IJ SOLN
2.0000 mg | INTRAMUSCULAR | Status: DC | PRN
  Administered 2011-12-04 (×3): 4 mg via INTRAVENOUS
  Administered 2011-12-04 (×2): 2 mg via INTRAVENOUS
  Filled 2011-12-03 (×4): qty 1

## 2011-12-03 MED ORDER — MIDAZOLAM HCL 5 MG/5ML IJ SOLN
INTRAMUSCULAR | Status: DC | PRN
  Administered 2011-12-03 (×2): 2 mg via INTRAVENOUS
  Administered 2011-12-03: 4 mg via INTRAVENOUS
  Administered 2011-12-03: 2 mg via INTRAVENOUS
  Administered 2011-12-03: 4 mg via INTRAVENOUS
  Administered 2011-12-03: 1 mg via INTRAVENOUS
  Administered 2011-12-03: 2 mg via INTRAVENOUS
  Administered 2011-12-03: 4 mg via INTRAVENOUS
  Administered 2011-12-03: 3 mg via INTRAVENOUS

## 2011-12-03 MED ORDER — ROSUVASTATIN CALCIUM 20 MG PO TABS
20.0000 mg | ORAL_TABLET | Freq: Every day | ORAL | Status: DC
  Administered 2011-12-04 – 2011-12-10 (×7): 20 mg via ORAL
  Filled 2011-12-03 (×8): qty 1

## 2011-12-03 MED ORDER — VECURONIUM BROMIDE 10 MG IV SOLR
INTRAVENOUS | Status: DC | PRN
  Administered 2011-12-03 (×2): 5 mg via INTRAVENOUS
  Administered 2011-12-03: 10 mg via INTRAVENOUS

## 2011-12-03 MED ORDER — HEMOSTATIC AGENTS (NO CHARGE) OPTIME
TOPICAL | Status: DC | PRN
  Administered 2011-12-03: 3

## 2011-12-03 MED FILL — Potassium Chloride Inj 2 mEq/ML: INTRAVENOUS | Qty: 40 | Status: AC

## 2011-12-03 MED FILL — Magnesium Sulfate Inj 50%: INTRAMUSCULAR | Qty: 10 | Status: AC

## 2011-12-03 MED FILL — Dexmedetomidine HCl IV Soln 200 MCG/2ML: INTRAVENOUS | Qty: 2 | Status: AC

## 2011-12-03 SURGICAL SUPPLY — 125 items
ADAPTER CARDIO PERF ANTE/RETRO (ADAPTER) ×3 IMPLANT
APPLIER CLIP 9.375 SM OPEN (CLIP)
ATTRACTOMAT 16X20 MAGNETIC DRP (DRAPES) ×3 IMPLANT
BAG DECANTER FOR FLEXI CONT (MISCELLANEOUS) ×3 IMPLANT
BANDAGE ELASTIC 4 VELCRO ST LF (GAUZE/BANDAGES/DRESSINGS) ×3 IMPLANT
BANDAGE ELASTIC 6 VELCRO ST LF (GAUZE/BANDAGES/DRESSINGS) ×3 IMPLANT
BANDAGE GAUZE 4  KLING STR (GAUZE/BANDAGES/DRESSINGS) ×3 IMPLANT
BANDAGE GAUZE ELAST BULKY 4 IN (GAUZE/BANDAGES/DRESSINGS) ×3 IMPLANT
BASKET HEART (ORDER IN 25'S) (MISCELLANEOUS) ×1
BASKET HEART (ORDER IN 25S) (MISCELLANEOUS) ×2 IMPLANT
BLADE SAW STERNAL (BLADE) ×3 IMPLANT
BLADE SURG 11 STRL SS (BLADE) IMPLANT
BLADE SURG 15 STRL LF DISP TIS (BLADE) ×2 IMPLANT
BLADE SURG 15 STRL SS (BLADE) ×1
BNDG COHESIVE 4X5 WHT NS (GAUZE/BANDAGES/DRESSINGS) ×3 IMPLANT
BNDG ELASTIC 6X10 VLCR STRL LF (GAUZE/BANDAGES/DRESSINGS) ×3 IMPLANT
CANISTER SUCTION 2500CC (MISCELLANEOUS) ×3 IMPLANT
CANN PRFSN .5XCNCT 15X34-48 (MISCELLANEOUS) ×2
CANNULA ARTERIAL NVNT 3/8 22FR (MISCELLANEOUS) IMPLANT
CANNULA GUNDRY RCSP 15FR (MISCELLANEOUS) ×3 IMPLANT
CANNULA PRFSN .5XCNCT 15X34-48 (MISCELLANEOUS) ×2 IMPLANT
CANNULA VEN 1 STAGE STR 66236 (MISCELLANEOUS) IMPLANT
CANNULA VEN 2 STAGE (MISCELLANEOUS) ×1
CANNULA VESSEL W/WING W/VALVE (CANNULA) ×3 IMPLANT
CATH CPB KIT HENDRICKSON (MISCELLANEOUS) ×3 IMPLANT
CATH ROBINSON RED A/P 18FR (CATHETERS) ×6 IMPLANT
CATH THORACIC 36FR RT ANG (CATHETERS) IMPLANT
CLIP APPLIE 9.375 SM OPEN (CLIP) IMPLANT
CLIP FOGARTY SPRING 6M (CLIP) ×3 IMPLANT
CLIP TI MEDIUM 24 (CLIP) IMPLANT
CLIP TI WIDE RED SMALL 24 (CLIP) ×12 IMPLANT
CLOTH BEACON ORANGE TIMEOUT ST (SAFETY) ×3 IMPLANT
CONN 1/2X1/2X1/2  BEN (MISCELLANEOUS) ×1
CONN 1/2X1/2X1/2 BEN (MISCELLANEOUS) ×2 IMPLANT
CONN 3/8X1/2 ST GISH (MISCELLANEOUS) ×6 IMPLANT
CONN Y 3/8X3/8X3/8  BEN (MISCELLANEOUS)
CONN Y 3/8X3/8X3/8 BEN (MISCELLANEOUS) IMPLANT
COVER SURGICAL LIGHT HANDLE (MISCELLANEOUS) ×6 IMPLANT
CRADLE DONUT ADULT HEAD (MISCELLANEOUS) ×3 IMPLANT
DRAPE CARDIOVASCULAR INCISE (DRAPES) ×1
DRAPE SLUSH/WARMER DISC (DRAPES) IMPLANT
DRAPE SRG 135X102X78XABS (DRAPES) ×2 IMPLANT
DRSG COVADERM 4X14 (GAUZE/BANDAGES/DRESSINGS) ×6 IMPLANT
ELECT CAUTERY BLADE 6.4 (BLADE) IMPLANT
ELECT PAD GROUND ADT 9 (MISCELLANEOUS) IMPLANT
ELECT REM PT RETURN 9FT ADLT (ELECTROSURGICAL) ×6
ELECTRODE REM PT RTRN 9FT ADLT (ELECTROSURGICAL) ×4 IMPLANT
GLOVE BIO SURGEON STRL SZ 6 (GLOVE) IMPLANT
GLOVE BIO SURGEON STRL SZ 6.5 (GLOVE) IMPLANT
GLOVE BIO SURGEON STRL SZ7.5 (GLOVE) IMPLANT
GLOVE EUDERMIC 7 POWDERFREE (GLOVE) ×12 IMPLANT
GOWN PREVENTION PLUS XLARGE (GOWN DISPOSABLE) ×12 IMPLANT
GOWN STRL NON-REIN LRG LVL3 (GOWN DISPOSABLE) ×12 IMPLANT
HEMOSTAT POWDER SURGIFOAM 1G (HEMOSTASIS) ×9 IMPLANT
HEMOSTAT SURGICEL 2X14 (HEMOSTASIS) ×3 IMPLANT
INSERT FOGARTY XLG (MISCELLANEOUS) IMPLANT
KIT BASIN OR (CUSTOM PROCEDURE TRAY) ×3 IMPLANT
KIT PAIN CUSTOM (MISCELLANEOUS) IMPLANT
KIT ROOM TURNOVER OR (KITS) ×3 IMPLANT
KIT SUCTION CATH 14FR (SUCTIONS) ×6 IMPLANT
KIT VASOVIEW 6 PRO VH 2400 (KITS) ×3 IMPLANT
KIT VASOVIEW ACCESSORY VH 2004 (KITS) ×3 IMPLANT
LINE VENT (MISCELLANEOUS) ×3 IMPLANT
LOOP VESSEL SUPERMAXI WHITE (MISCELLANEOUS) ×3 IMPLANT
MARKER GRAFT CORONARY BYPASS (MISCELLANEOUS) ×9 IMPLANT
NS IRRIG 1000ML POUR BTL (IV SOLUTION) ×15 IMPLANT
PACK OPEN HEART (CUSTOM PROCEDURE TRAY) ×3 IMPLANT
PAD ARMBOARD 7.5X6 YLW CONV (MISCELLANEOUS) ×6 IMPLANT
PENCIL BUTTON HOLSTER BLD 10FT (ELECTRODE) ×3 IMPLANT
PUNCH AORTIC ROTATE 4.0MM (MISCELLANEOUS) IMPLANT
PUNCH AORTIC ROTATE 4.5MM 8IN (MISCELLANEOUS) ×3 IMPLANT
PUNCH AORTIC ROTATE 5MM 8IN (MISCELLANEOUS) IMPLANT
SET CARDIOPLEGIA MPS 5001102 (MISCELLANEOUS) ×3 IMPLANT
SPONGE GAUZE 4X4 12PLY (GAUZE/BANDAGES/DRESSINGS) ×6 IMPLANT
SPONGE GAUZE 4X4 STERILE 39 (GAUZE/BANDAGES/DRESSINGS) ×3 IMPLANT
SUCKER WEIGHTED FLEX (MISCELLANEOUS) ×3 IMPLANT
SUT ETHIBOND 2 0 SH (SUTURE) ×7 IMPLANT
SUT ETHIBOND 2 0 SH 36X2 (SUTURE) ×8 IMPLANT
SUT ETHIBOND 2 0 V4 (SUTURE) IMPLANT
SUT ETHIBOND 2 0V4 GREEN (SUTURE) IMPLANT
SUT MNCRL AB 4-0 PS2 18 (SUTURE) IMPLANT
SUT PROLENE 3 0 SH DA (SUTURE) ×3 IMPLANT
SUT PROLENE 3 0 SH1 36 (SUTURE) ×3 IMPLANT
SUT PROLENE 4 0 RB 1 (SUTURE) ×3
SUT PROLENE 4 0 SH DA (SUTURE) ×3 IMPLANT
SUT PROLENE 4-0 RB1 .5 CRCL 36 (SUTURE) ×6 IMPLANT
SUT PROLENE 5 0 C 1 36 (SUTURE) ×6 IMPLANT
SUT PROLENE 5 0 CC1 (SUTURE) ×3 IMPLANT
SUT PROLENE 6 0 C 1 30 (SUTURE) ×15 IMPLANT
SUT PROLENE 7 0 BV 1 (SUTURE) ×6 IMPLANT
SUT PROLENE 7 0 BV1 MDA (SUTURE) ×12 IMPLANT
SUT PROLENE 8 0 BV175 6 (SUTURE) IMPLANT
SUT PROLENE POLY MONO (SUTURE) ×3 IMPLANT
SUT SILK  1 MH (SUTURE) ×2
SUT SILK 1 MH (SUTURE) ×4 IMPLANT
SUT SILK 1 TIES 10X30 (SUTURE) ×3 IMPLANT
SUT SILK 2 0 (SUTURE) ×1
SUT SILK 2 0 SH CR/8 (SUTURE) ×6 IMPLANT
SUT SILK 2-0 18XBRD TIE 12 (SUTURE) ×2 IMPLANT
SUT SILK 3 0 SH CR/8 (SUTURE) ×3 IMPLANT
SUT SILK 4 0 (SUTURE) ×1
SUT SILK 4-0 18XBRD TIE 12 (SUTURE) ×2 IMPLANT
SUT STEEL 6MS V (SUTURE) ×3 IMPLANT
SUT STEEL STERNAL CCS#1 18IN (SUTURE) IMPLANT
SUT STEEL SZ 6 DBL 3X14 BALL (SUTURE) ×3 IMPLANT
SUT TEM PAC WIRE 2 0 SH (SUTURE) ×12 IMPLANT
SUT VIC AB 1 CTX 36 (SUTURE) ×2
SUT VIC AB 1 CTX36XBRD ANBCTR (SUTURE) ×4 IMPLANT
SUT VIC AB 2-0 CT1 27 (SUTURE)
SUT VIC AB 2-0 CT1 TAPERPNT 27 (SUTURE) IMPLANT
SUT VIC AB 2-0 CTX 27 (SUTURE) ×3 IMPLANT
SUT VIC AB 3-0 SH 27 (SUTURE)
SUT VIC AB 3-0 SH 27X BRD (SUTURE) IMPLANT
SUT VIC AB 3-0 X1 27 (SUTURE) ×3 IMPLANT
SUT VICRYL 4-0 PS2 18IN ABS (SUTURE) IMPLANT
SUTURE E-PAK OPEN HEART (SUTURE) ×3 IMPLANT
SYSTEM SAHARA CHEST DRAIN ATS (WOUND CARE) ×3 IMPLANT
TAPE CLOTH SURG 4X10 WHT LF (GAUZE/BANDAGES/DRESSINGS) ×3 IMPLANT
TOWEL OR 17X24 6PK STRL BLUE (TOWEL DISPOSABLE) ×6 IMPLANT
TOWEL OR 17X26 10 PK STRL BLUE (TOWEL DISPOSABLE) ×9 IMPLANT
TRAY FOLEY IC TEMP SENS 14FR (CATHETERS) ×3 IMPLANT
TRAY FOLEY IC TEMP SENS 16FR (CATHETERS) ×3 IMPLANT
TUBING INSUFFLATION 10FT LAP (TUBING) ×3 IMPLANT
UNDERPAD 30X30 INCONTINENT (UNDERPADS AND DIAPERS) ×3 IMPLANT
WATER STERILE IRR 1000ML POUR (IV SOLUTION) ×6 IMPLANT

## 2011-12-03 NOTE — Preoperative (Signed)
Beta Blockers   Reason not to administer Beta Blockers:Not Applicable 

## 2011-12-03 NOTE — Brief Op Note (Addendum)
                   [address]            [place] [address]          [phone]    11/21/2011 - 12/03/2011  11:52 AM  PATIENT:  Francisco [name]  66 y.o. male  PRE-OPERATIVE DIAGNOSIS: SEVERE CAD  POST-OPERATIVE DIAGNOSIS: SAME PROCEDURE:  Procedure(s): CORONARY ARTERY BYPASS GRAFTING (CABG)x5 (LIMA-LAD; SEQSVG-RAMUS-OM; SVG-PD; SVG-DIAG) EVH RIGHT LEG AND LEFT THIGH  SURGEON:  Surgeon(s): Melrose Nakayama, MD  PHYSICIAN ASSISTANT: Jadene Pierini PA-C  ANESTHESIA:   general  PATIENT CONDITION:  ICU - intubated and hemodynamically stable.  PRE-OPERATIVE WEIGHT: [id number]  COMPLICATIONS- No Known    XC = 79 min CPB = 122 min  TEE trace MR

## 2011-12-03 NOTE — Anesthesia Procedure Notes (Signed)
Procedure Name: Intubation Date/Time: 12/03/2011 8:05 AM Performed by: Luane School A. Patient Re-evaluated:Patient Re-evaluated prior to inductionOxygen Delivery Method: Circle System Utilized Preoxygenation: Pre-oxygenation with 100% oxygen Intubation Type: IV induction Ventilation: Mask ventilation without difficulty and Oral airway inserted - appropriate to patient size Laryngoscope Size: Sabra Heck and 2 Grade View: Grade I Tube type: Oral Tube size: 8.0 mm Number of attempts: 1 Airway Equipment and Method: stylet Placement Confirmation: ETT inserted through vocal cords under direct vision,  positive ETCO2 and breath sounds checked- equal and bilateral Secured at: 22 cm Tube secured with: Tape Dental Injury: Teeth and Oropharynx as per pre-operative assessment

## 2011-12-03 NOTE — Transfer of Care (Signed)
Immediate Anesthesia Transfer of Care Note  Patient: MAKENZIE RANNOW  Procedure(s) Performed:  CORONARY ARTERY BYPASS GRAFTING (CABG) - Times 5 on  pump.  using right internal mammary artery and bilateral greater saphenous veins.   Patient Location: SICU  Anesthesia Type: General  Level of Consciousness: sedated and Patient remains intubated per anesthesia plan  Airway & Oxygen Therapy: Patient remains intubated per anesthesia plan and Patient placed on Ventilator (see vital sign flow sheet for setting)  Post-op Assessment: Report given to PACU RN and Post -op Vital signs reviewed and stable  Post vital signs: Reviewed and stable Filed Vitals:   12/03/11 0448  BP: 131/75  Pulse: 69  Temp: 36.4 C  Resp: 18    Complications: No apparent anesthesia complications

## 2011-12-03 NOTE — Progress Notes (Signed)
Patient ID: Francisco [name], male   DOB: May 24, 1946, 66 y.o.   MRN: [id number] S/p CABG x5 Waking up, in process of getting extubated now BP 111/79  Pulse 80  Temp(Src) 98.2 F (36.8 C) (Core (Comment))  Resp 21  Ht 5\' 5"  (1.651 m)  Wt 72.9 kg (160 lb 11.5 oz)  BMI 26.74 kg/m2  SpO2 100% K 4.0 CBC    Component Value Date/Time   WBC 5.7 12/03/2011 1400   RBC 2.99* 12/03/2011 1400   HGB 8.5* 12/03/2011 1405   HCT 25.0* 12/03/2011 1405   PLT 100* 12/03/2011 1400   MCV 81.3 12/03/2011 1400   MCH 27.4 12/03/2011 1400   MCHC 33.7 12/03/2011 1400   RDW 14.7 12/03/2011 1400   LYMPHSABS 1.4 12/02/2011 0558   MONOABS 1.2* 12/02/2011 0558   EOSABS 0.2 12/02/2011 0558   BASOSABS 0.0 12/02/2011 0558    Stable early postop Continue dopamine for renal perfusion

## 2011-12-03 NOTE — Procedures (Signed)
Extubation Procedure Note  Patient Details:   Name: Francisco [name] DOB: 1946-09-03 MRN: [id number]    Evaluation  O2 sats: stable throughout Complications: No apparent complications Patient did tolerate procedure well. Bilateral Breath Sounds: Coarse crackles   Yes Pt extubated per protocol.  Positive leak test.  Placed on 4l [place].  Pt tolerated well. [place] 12/03/2011, 7:22 PM

## 2011-12-03 NOTE — Anesthesia Preprocedure Evaluation (Addendum)
Anesthesia Evaluation  Patient identified by MRN, date of birth, ID band Patient awake    Reviewed: Allergy & Precautions, H&P , NPO status , Patient's Chart, lab work & pertinent test results, reviewed documented beta blocker date and time   Airway Mallampati: II TM Distance: >3 FB Neck ROM: Full    Dental  (+) Teeth Intact and Dental Advisory Given   Pulmonary shortness of breath and with exertion,  clear to auscultation        Cardiovascular hypertension, Pt. on medications and Pt. on home beta blockers + CAD, + Past MI (NSTMI on admissiom 11/22/11; CM), +CHF (requiring BiPap on admission) and + Orthopnea + Valvular Problems/Murmurs MR Irregular Normal    Neuro/Psych Negative Neurological ROS  Negative Psych ROS   GI/Hepatic negative GI ROS, Neg liver ROS,   Endo/Other  Negative Endocrine ROS  Renal/GU ARFRenal disease     Musculoskeletal negative musculoskeletal ROS (+)   Abdominal   Peds  Hematology negative hematology ROS (+)   Anesthesia Other Findings   Reproductive/Obstetrics                         Anesthesia Physical Anesthesia Plan  ASA: III  Anesthesia Plan: General   Post-op Pain Management:    Induction: Intravenous  Airway Management Planned: Oral ETT  Additional Equipment: 3D TEE, PA Cath and Arterial line  Intra-op Plan:   Post-operative Plan: Post-operative intubation/ventilation  Informed Consent: I have reviewed the patients History and Physical, chart, labs and discussed the procedure including the risks, benefits and alternatives for the proposed anesthesia with the patient or authorized representative who has indicated his/her understanding and acceptance.   Dental advisory given  Plan Discussed with: CRNA and Surgeon  Anesthesia Plan Comments:         Anesthesia Quick Evaluation

## 2011-12-04 ENCOUNTER — Encounter (HOSPITAL_COMMUNITY): Payer: Self-pay | Admitting: Thoracic Surgery (Cardiothoracic Vascular Surgery)

## 2011-12-04 ENCOUNTER — Inpatient Hospital Stay (HOSPITAL_COMMUNITY): Payer: Medicare Other

## 2011-12-04 DIAGNOSIS — Z951 Presence of aortocoronary bypass graft: Secondary | ICD-10-CM

## 2011-12-04 LAB — MAGNESIUM: Magnesium: 2 mg/dL (ref 1.5–2.5)

## 2011-12-04 LAB — CBC
HCT: 23.8 % — ABNORMAL LOW (ref 39.0–52.0)
Hemoglobin: 5.6 g/dL — CL (ref 13.0–17.0)
Hemoglobin: 8.1 g/dL — ABNORMAL LOW (ref 13.0–17.0)
MCH: 28.6 pg (ref 26.0–34.0)
MCHC: 33.3 g/dL (ref 30.0–36.0)
MCV: 84.1 fL (ref 78.0–100.0)
RBC: 2.06 MIL/uL — ABNORMAL LOW (ref 4.22–5.81)
RBC: 2.83 MIL/uL — ABNORMAL LOW (ref 4.22–5.81)
WBC: 6.7 10*3/uL (ref 4.0–10.5)
WBC: 9.7 10*3/uL (ref 4.0–10.5)

## 2011-12-04 LAB — GLUCOSE, CAPILLARY
Glucose-Capillary: 102 mg/dL — ABNORMAL HIGH (ref 70–99)
Glucose-Capillary: 122 mg/dL — ABNORMAL HIGH (ref 70–99)
Glucose-Capillary: 82 mg/dL (ref 70–99)

## 2011-12-04 LAB — BASIC METABOLIC PANEL
BUN: 41 mg/dL — ABNORMAL HIGH (ref 6–23)
Chloride: 117 mEq/L — ABNORMAL HIGH (ref 96–112)
GFR calc Af Amer: 26 mL/min — ABNORMAL LOW (ref >90)
GFR calc non Af Amer: 22 mL/min — ABNORMAL LOW (ref >90)
Potassium: 4.7 mEq/L (ref 3.5–5.1)
Sodium: 143 mEq/L (ref 135–145)

## 2011-12-04 LAB — CREATININE, SERUM: GFR calc Af Amer: 21 mL/min — ABNORMAL LOW (ref >90)

## 2011-12-04 LAB — POCT I-STAT, CHEM 8
Calcium, Ion: 1.14 mmol/L (ref 1.12–1.32)
HCT: 23 % — ABNORMAL LOW (ref 39.0–52.0)
TCO2: 19 mmol/L (ref 0–100)

## 2011-12-04 LAB — PREPARE RBC (CROSSMATCH)

## 2011-12-04 MED ORDER — MIDAZOLAM HCL 2 MG/2ML IJ SOLN: 2.0000 mg | Freq: Once | INTRAMUSCULAR | Status: DC

## 2011-12-04 MED ORDER — FUROSEMIDE 10 MG/ML IJ SOLN
80.0000 mg | Freq: Once | INTRAMUSCULAR | Status: AC
  Administered 2011-12-04: 80 mg via INTRAVENOUS
  Filled 2011-12-04: qty 8

## 2011-12-04 MED ORDER — INSULIN ASPART 100 UNIT/ML ~~LOC~~ SOLN
0.0000 [IU] | SUBCUTANEOUS | Status: DC
  Administered 2011-12-04 – 2011-12-05 (×3): 2 [IU] via SUBCUTANEOUS

## 2011-12-04 MED ORDER — CIPROFLOXACIN HCL 500 MG PO TABS
500.0000 mg | ORAL_TABLET | Freq: Every day | ORAL | Status: AC
  Administered 2011-12-04 – 2011-12-08 (×5): 500 mg via ORAL
  Filled 2011-12-04 (×5): qty 1

## 2011-12-04 MED ORDER — TRAMADOL HCL 50 MG PO TABS
50.0000 mg | ORAL_TABLET | Freq: Four times a day (QID) | ORAL | Status: DC | PRN
  Administered 2011-12-05 – 2011-12-06 (×2): 50 mg via ORAL
  Filled 2011-12-04 (×2): qty 1

## 2011-12-04 MED ORDER — CARVEDILOL 3.125 MG PO TABS
3.1250 mg | ORAL_TABLET | Freq: Two times a day (BID) | ORAL | Status: DC
  Administered 2011-12-04 – 2011-12-07 (×7): 3.125 mg via ORAL
  Filled 2011-12-04 (×9): qty 1

## 2011-12-04 MED ORDER — POLYVINYL ALCOHOL 1.4 % OP SOLN
1.0000 [drp] | OPHTHALMIC | Status: DC | PRN
  Filled 2011-12-04: qty 15

## 2011-12-04 NOTE — Progress Notes (Signed)
1 Day Post-Op Procedure(s) (LRB): CORONARY ARTERY BYPASS GRAFTING (CABG) (N/A) Subjective: Very low pain tolerance, grunting "Hurts everywhere"  Objective: Vital signs in last 24 hours: Temp:  [94.8 F (34.9 [name])-101.3 F (38.5 [name])] 99.9 F (37.7 [name]) (01/11 0700) Pulse Rate:  [79-93] 79  (01/11 0700) Cardiac Rhythm:  [-] Atrial paced (01/11 0700) Resp:  [10-26] 20  (01/11 0700) BP: (79-118)/(51-85) 118/58 mmHg (01/11 0700) SpO2:  [97 %-100 %] 97 % (01/11 0700) Arterial Line BP: (86-144)/(46-81) 118/58 mmHg (01/11 0700) FiO2 (%):  [40 %-50 %] 40.1 % (01/10 1845) Weight:  [78.1 kg (172 lb 2.9 oz)] 78.1 kg (172 lb 2.9 oz) (01/11 0500)  Hemodynamic parameters for last 24 hours: PAP: (27-51)/(16-28) 48/21 mmHg CO:  [3.3 L/min-6.2 L/min] 6.2 L/min CI:  [1.8 L/min/m2-3.5 L/min/m2] 3.5 L/min/m2  Intake/Output from previous day: 01/10 0701 - 01/11 0700 In: 7682.7 [P.O.:90; I.V.:4818.7; Blood:734; NG/GT:30; IV Piggyback:2000] Out: [id number] [Urine:3975; Blood:675; Chest Tube:1020] Intake/Output this shift:    General appearance: alert and uncooperative Neurologic: intact Heart: regular rate and rhythm Lungs: diminished breath sounds bibasilar Abdomen: mildly distended, hypoactive BS  Lab Results:  Basename 12/04/11 0430 12/03/11 2021 12/03/11 2000  WBC 6.7 -- 8.7  HGB 5.6* 10.2* --  HCT 16.8* 30.0* --  PLT 93* -- 126*   BMET:  Basename 12/04/11 0430 12/03/11 2021 12/03/11 0545  NA 143 146* --  K 4.7 4.9 --  CL 117* 115* --  CO2 17* -- 21  GLUCOSE 113* 116* --  BUN 41* 37* --  CREATININE 2.79* 2.70* --  CALCIUM 6.7* -- 9.5    PT/INR:  Basename 12/03/11 1400  LABPROT 19.1*  INR 1.57*   ABG    Component Value Date/Time   PHART 7.343* 12/03/2011 2017   HCO3 18.6* 12/03/2011 2017   TCO2 19 12/03/2011 2021   ACIDBASEDEF 6.0* 12/03/2011 2017   O2SAT 97.0 12/03/2011 2017   CBG (last 3)   Basename 12/04/11 0325 12/04/11 0002 12/03/11 1919  GLUCAP 102* 82 128*     Assessment/Plan: S/P Procedure(s) (LRB): CORONARY ARTERY BYPASS GRAFTING (CABG) (N/A) POD #1 CABG x 5 CV: hemodynamics OK, good cardiac index RESP: pulmonary hygiene limited by pt's refusal to cough, poor effort with IS RENAL: urine output OK, Creatinine near baseline, lytes OK, follow Anemia- acute secondary to ABL on chronic- transfuse 2 units D/[name] CT OOB, ambulate   LOS: 13 days    Francisco [name] 12/04/2011

## 2011-12-04 NOTE — Op Note (Signed)
Francisco [name], Francisco [name] NO.:  [id number]  MEDICAL RECORD NO.:  [id number]  LOCATION:  2310                         FACILITY:  [facility]  PHYSICIAN:  Revonda Standard. Roxan Hockey, M.D.DATE OF BIRTH:  1946-08-22  DATE OF PROCEDURE:  12/03/2011 DATE OF DISCHARGE:                              OPERATIVE REPORT   PREOPERATIVE DIAGNOSIS:  Severe three-vessel coronary disease with ischemic cardiomyopathy.  POSTOPERATIVE DIAGNOSIS:  Severe three-vessel coronary disease with ischemic cardiomyopathy.  PROCEDURE:  Median sternotomy, extracorporeal circulation, coronary bypass grafting x5 (left internal mammary artery to left anterior descending, saphenous vein graft to 1st diagonal, sequential saphenous vein graft to ramus intermedius and obtuse marginal 1, saphenous vein graft to posterior descending).  SURGEONS:  Revonda Standard. Roxan Hockey, MD.  ASSISTANT:  Custer Giovanni, PA-C  ANESTHESIA:  General.  FINDINGS:  Transesophageal echocardiography revealed only trace mitral regurgitation, severe left ventricular dysfunction with ejection fraction of 25-30%.  Vein below-the-knee in right leg, too small to use, therefore additional vein harvested from left leg.  All vein that was utilized was satisfactory.  Mammary artery satisfactory. All target vessels were good quality at the site of the anastomoses.  CLINICAL NOTE:  Francisco [name] is a 66 year old gentleman who presented with an acute MI.  He ruled in with a CK of greater than 4000 and MB of almost 500.  He was also in heart failure with a BNP of 8000, and renal failure with a creatinine that initially was 2.4 and subsequently rose to 3.8.  Workup included an echocardiogram, which showed an ejection fraction of 30% with mild-to-moderate mitral regurgitation.  A nuclear scan, which showed ischemia and ejection fraction of 24%.  Cardiac catheterizations revealed severe three-vessel coronary disease.  The patient was referred for coronary  bypass grafting.  The patient was advised of the high risk nature of the procedure, and particularly in regards to renal failure and consequences of that in the postoperative period.  He was advised that he needed coronary bypass grafting and possibly a mitral valve repair and the mitral valve would be evaluated with an intraoperative transesophageal echocardiogram.  The patient understood and accepted the risks of surgery and agreed to proceed.  OPERATIVE NOTE:  Francisco [name] was brought to the preop holding area on December 03, 2011.  There, the Anesthesia Service placed a Swan-Ganz catheter and arterial blood pressure monitoring line.  Intravenous antibiotics were administered.  He was taken to the operating room, anesthetized, and intubated.  A Foley catheter was placed. Transesophageal echocardiography was performed.  Please refer to Dr. Jeneen Rinks Massagee's fully dictated note for complete results. Transesophageal echocardiography did show left ventricular hypertrophy and dilatation.  There was hypokinesis with impaired left ventricular function with ejection fraction of approximately 30%.  There was only trace mitral regurgitation.  The chest, abdomen, and legs were prepped and draped in usual sterile fashion.  A median sternotomy was performed and the left internal mammary artery was harvested using standard technique.  Simultaneously, incision made in the medial aspect of the right leg at the level of the knee.  The greater saphenous vein was harvested from the right leg from groin to mid calf.  Heparin 2000 units was administered during the vessel harvest.  After cannulating the vein, it was clear that there was a segment from below the knee on the right was too small to utilize as the vessel had bifurcated.  The decision was made to harvest additional vein from the left thigh.  This vein from the left thigh was satisfactory.  After harvesting the conduits, remainder of full heparin  dose was given. The pericardium was opened.  The ascending aorta was inspected.  There was no evidence of atherosclerotic disease.  The aorta was cannulated via concentric 2-0 Ethibond pledgeted pursestring sutures.  A dual stage venous cannula was placed via pursestring suture in the right atrial appendage.  Cardiopulmonary bypass was instituted and the patient was cooled to 32 degrees Celsius.  The coronary arteries were inspected and anastomotic sites were chosen.  The conduits were inspected and cut to length.  A foam pad was placed in the pericardium to insulate the heart and protect the left phrenic nerve.  Temperature probe was placed in myocardial septum.  A cardioplegic cannula was placed in the ascending aorta.  Retrograde cardioplegic cannula was placed via pursestring suture in the right atrium and directed in the coronary sinus.  The aorta was crossclamped.  The left ventricle was emptied via the aortic root vent.  Cardiac arrest was then achieved with combination of cold antegrade and retrograde blood cardioplegia and topical iced saline.  Initial 500 mL of cardioplegia was administered antegrade. There was a rapid diastolic arrest.  An additional 1 L of cardioplegia was administered retrograde.  There was septal cooling to 10 degrees Celsius.  The following distal anastomoses were performed.  First, a reversed saphenous vein graft was placed end-to-side to the posterior descending branch of the right coronary.  This vessel was a 1.5-mm target vessel. There was some plaquing distally to the target, but probe did pass beyond this.  The vein was anastomosed end-to-side with a running 7-0 Prolene suture.  There was good flow through the graft and good hemostasis with cardioplegia was administered.  Next, a reversed saphenous vein graft was placed end-to-side to the 1st diagonal branch to the LAD.  This was a large bifurcating anterolateral branch.  It was grafted just  beyond the bifurcation as there was extensive plaquing prior to the bifurcation.  There was a 1.5 mm vessel at the site of the anastomosis.  The vein graft anastomosed end-to-side with a running 7-0 Prolene suture.  Again, with cardioplegia administration, there was good flow and good hemostasis.  Next, a reversed saphenous vein graft was placed sequentially to the ramus intermedius and obtuse marginal 1.  The ramus was a 2-mm intramyocardial vessel.  The vein was anastomosed side-to-side with a running 7-0 Prolene suture.  The distal end then was beveled and was anastomosed end-to-side to OM 1, just beyond this bifurcation into the larger of the 2 branches, which was a 1.5-mm good quality target. With cardioplegia administration, there was good flow through the graft and good hemostasis at both anastomoses.  Next, the left internal mammary artery was brought through a window in the pericardium.  The distal end was beveled.  It was anastomosed end-to- side to the distal LAD.  This was a 2-mm target.  The LAD was rather thick walled, but there was no focal plaque in the site of the anastomosis.  The mammary was anastomosed end-to-side with a running 8-0 Prolene suture.  At the completion of anastomosis, the bulldog clamp was briefly  removed to inspect for hemostasis.  Immediate rapid septal rewarming was noted.  The bulldog clamp was replaced.  The mammary and pedicle was tacked to the epicardial surface of the heart with 6-0 Prolene sutures.  Additional cardioplegia was administered via the retrograde cannula. The vein grafts were cut to length.  The cardioplegic cannula was removed from the ascending aorta and the proximal vein graft anastomoses were performed to 4.5-mm punch aortotomies with running 6-0 Prolene sutures.  At the completion of final proximal anastomosis, the patient was placed in Trendelenburg position, a warm dose of retrograde cardioplegia was administered.   Lidocaine was administered.  The bulldog clamp was removed from the left mammary artery.  The aortic root was de-aired and the aortic crossclamp was removed.  The total crossclamp time was 79 minutes.  The patient required 2 attempts at defibrillation with 20 joules and then was in sinus rhythm thereafter.  While rewarming was completed, all proximal and distal anastomoses were inspected for hemostasis. Epicardial pacing wires were placed on the right ventricle and right atrium.  The retrograde cardioplegic cannula was removed.  When the patient had rewarmed to a core temperature of 37 degrees Celsius, he was DDD paced at 80 beats per minute.  He had been started on a dopamine infusion at the initiation of bypass. This was run at 3 mcg/kg per minute.  During bypass, prior to weaning, this was increased to 5 mcg/kg per minute.  The patient weaned from cardiopulmonary bypass on the 1st attempt without difficulty.  The initial cardiac index was greater than 2 L/minute per m squared and the patient remained hemodynamically stable throughout the postbypass period.  Postbypass transesophageal echocardiography revealed slight improvement in wall motion.  There was no change in the mild mitral regurgitation.  A test dose of protamine was administered and was well tolerated.  The atrial and aortic cannulae were removed.  The remainder of protamine was administered without incident.  Chest was irrigated with 1 L of warm saline.  Hemostasis was achieved.  The pericardium was reapproximated over the aortic root with interrupted 3-0 silk sutures.  The left pleural and single mediastinal chest tubes were placed in separate subcostal incisions.  The sternum was closed with interrupted single and double heavy gauge stainless steel wires.  Pectoralis fascia, subcutaneous tissue, and skin were closed in standard fashion.  All sponge, needle, and instrument counts were correct at the end of  the procedure.  The patient was taken from the operating room to the surgical intensive care unit in good condition.     Revonda Standard Roxan Hockey, M.D.     SCH/MEDQ  D:  12/03/2011  T:  12/04/2011  Job:  [id number]

## 2011-12-04 NOTE — Progress Notes (Signed)
UR Completed.  Francisco [name] [id number] 12/04/2011

## 2011-12-04 NOTE — Anesthesia Postprocedure Evaluation (Signed)
  Anesthesia Post-op Note  Patient: Francisco [name]  Procedure(s) Performed:  CORONARY ARTERY BYPASS GRAFTING (CABG) - Times 5 on  pump.  using right internal mammary artery and bilateral greater saphenous veins.   Patient Location: SICU  Anesthesia Type: General  Level of Consciousness: awake, alert , oriented and patient cooperative  Airway and Oxygen Therapy: Patient Spontanous Breathing  Post-op Pain: mild  Post-op Assessment: Post-op Vital signs reviewed, Patient's Cardiovascular Status Stable, Respiratory Function Stable, Patent Airway, No signs of Nausea or vomiting, Adequate PO intake and Pain level controlled  Post-op Vital Signs: Reviewed and stable  Complications: No apparent anesthesia complications

## 2011-12-04 NOTE — Progress Notes (Signed)
[facility]  Subjective: In quite a bit of pain.  Sitting in a chair with family in the room helping.  Objective: Vital signs in last 24 hours: Temp:  [94.8 F (34.9 C)-101.3 F (38.5 C)] 98.7 F (37.1 C) (01/11 1201) Pulse Rate:  [75-93] 90  (01/11 0837) Resp:  [10-26] 23  (01/11 0837) BP: (79-127)/(51-85) 127/67 mmHg (01/11 0837) SpO2:  [97 %-100 %] 100 % (01/11 0800) Arterial Line BP: (86-144)/(46-81) 117/58 mmHg (01/11 0800) FiO2 (%):  [40 %-50 %] 40.1 % (01/10 1845) Weight:  [78.1 kg (172 lb 2.9 oz)] 78.1 kg (172 lb 2.9 oz) (01/11 0500) Last BM Date: 11/30/11  Intake/Output from previous day: 01/10 0701 - 01/11 0700 In: 7682.7 [P.O.:90; I.V.:4818.7; Blood:734; NG/GT:30; IV [id number] Out: [id number] [Urine:3975; Blood:675; Chest Tube:1020] Intake/Output this shift: Total I/O In: 437.3 [I.V.:312.3; Blood:125] Out: 60 [Urine:40; Chest Tube:20]  Medications Current Facility-Administered Medications  Medication Dose Route Frequency Provider Last Rate Last Dose  . 0.45 % sodium chloride infusion   Intravenous Continuous Rayman Giovanni, PA 20 mL/hr at 12/03/11 2000    . 0.9 %  sodium chloride infusion   Intravenous Continuous Levie Giovanni, PA 40 mL/hr at 12/03/11 1415 20 mL at 12/03/11 1415  . 0.9 %  sodium chloride infusion  250 mL Intravenous Continuous Damond Giovanni, PA      . acetaminophen (TYLENOL) solution 650 mg  650 mg Per Tube NOW Emile Giovanni, PA       Or  . acetaminophen (TYLENOL) suppository 650 mg  650 mg Rectal NOW Havoc Giovanni, PA   650 mg at 12/03/11 1430  . acetaminophen (TYLENOL) tablet 1,000 mg  1,000 mg Oral Q6H Wayne E Gold, PA   1,000 mg at 12/04/11 1148   Or  . acetaminophen (TYLENOL) solution 975 mg  975 mg Per Tube Q6H Nelton Giovanni, PA   975 mg at 12/04/11 0015  . albumin human 5 % solution 250 mL  250 mL Intravenous Q15 min PRN Rithik Giovanni, PA   250 mL at 12/04/11 0102  . aspirin EC tablet 325 mg  325 mg Oral Daily Samvel Giovanni, [place]   325 mg at 12/04/11 1021   Or  . aspirin chewable tablet 324 mg  324 mg Per Tube Daily Wissam Giovanni, PA      . bisacodyl (DULCOLAX) EC tablet 10 mg  10 mg Oral Daily Xai Giovanni, PA   10 mg at 12/04/11 1021   Or  . bisacodyl (DULCOLAX) suppository 10 mg  10 mg Rectal Daily Rice Giovanni, PA      . carvedilol (COREG) tablet 3.125 mg  3.125 mg Oral BID WC Melrose Nakayama, MD   3.125 mg at 12/04/11 1021  . ciprofloxacin (CIPRO) tablet 500 mg  500 mg Oral Q breakfast Melrose Nakayama, MD   500 mg at 12/04/11 1022  . dexmedetomidine (PRECEDEX) 200 mcg in sodium chloride 0.9 % 50 mL infusion  0.1-0.7 mcg/kg/hr Intravenous Continuous Onur Giovanni, PA   0.1 mcg/kg/hr at 12/03/11 1724  . dextrose 50 % solution        14 mL at 12/03/11 1755  . docusate sodium (COLACE) capsule 200 mg  200 mg Oral Daily Kadan Giovanni, PA   200 mg at 12/04/11 1021  . DOPamine (INTROPIN) 800 mg in dextrose 5 % 250 mL infusion  2-10 mcg/kg/min Intravenous Titrated Gustin Giovanni, PA 4.1  mL/hr at 12/04/11 0700 3 mcg/kg/min at 12/04/11 0700  . famotidine (PEPCID) IVPB 20 mg  20 mg Intravenous Q12H Wayne E Gold, PA   20 mg at 12/03/11 1400  . fentaNYL (SUBLIMAZE) injection 50-100 mcg  50-100 mcg Intravenous PRN Delma Freeze, MD      . furosemide (LASIX) injection 80 mg  80 mg Intravenous Once Melrose Nakayama, MD   80 mg at 12/04/11 0858  . insulin aspart (novoLOG) injection 0-24 Units  0-24 Units Subcutaneous Q2H Melrose Nakayama, MD   2 Units at 12/03/11 2000  . insulin aspart (novoLOG) injection 0-24 Units  0-24 Units Subcutaneous Q4H Melrose Nakayama, MD      . insulin regular bolus via infusion 0-10 Units  0-10 Units Intravenous TID WC Wayne E Gold, PA      . lactated ringers infusion 500 mL  500 mL Intravenous Once PRN Ulisses Giovanni, PA      . lactated ringers infusion   Intravenous Continuous Delma Freeze, MD      . lactated ringers infusion   Intravenous Continuous Mckinley Giovanni,  PA      . magnesium sulfate IVPB 4 g 100 mL  4 g Intravenous Once Wayne E Gold, [place]      . metoprolol (LOPRESSOR) injection 2.5-5 mg  2.5-5 mg Intravenous Q2H PRN Deryl Giovanni, PA      . midazolam (VERSED) injection 1-2 mg  1-2 mg Intravenous PRN Delma Freeze, MD      . midazolam (VERSED) injection 2 mg  2 mg Intravenous Q1H PRN Chadric Giovanni, PA      . midazolam (VERSED) injection 2 mg  2 mg Intravenous Once Melrose Nakayama, MD      . morphine 2 MG/ML injection 1-4 mg  1-4 mg Intravenous Q1H PRN Jahki Giovanni, PA   2 mg at 12/03/11 2140  . morphine 4 MG/ML injection 2-5 mg  2-5 mg Intravenous Q1H PRN Janard Giovanni, PA   4 mg at 12/04/11 1147  . moxifloxacin (AVELOX) IVPB 400 mg  400 mg Intravenous Q24H Everardo Giovanni, PA   400 mg at 12/03/11 1622  . naloxone [facility]) 0.4 MG/ML injection        0.2 mg at 12/03/11 1915  . nitroGLYCERIN 0.2 mg/mL in dextrose 5 % infusion  0-100 mcg/min Intravenous Continuous Armel Giovanni, PA 3 mL/hr at 12/03/11 2000 10 mcg/min at 12/03/11 2000  . ondansetron (ZOFRAN) injection 4 mg  4 mg Intravenous Q6H PRN Deantre Giovanni, PA      . oxyCODONE (Oxy IR/ROXICODONE) immediate release tablet 5-10 mg  5-10 mg Oral Q3H PRN Geremy Giovanni, PA   10 mg at 12/04/11 1320  . pantoprazole (PROTONIX) EC tablet 40 mg  40 mg Oral Q1200 Wayne E Gold, PA      . phenylephrine (NEO-SYNEPHRINE) 20,000 mcg in dextrose 5 % 250 mL infusion  0-100 mcg/min Intravenous Continuous Placido Giovanni, PA 22.5 mL/hr at 12/04/11 0826 30 mcg/min at 12/04/11 0826  . potassium chloride 10 mEq in 50 mL *CENTRAL LINE* IVPB  10 mEq Intravenous Q1 Hr x 3 Wayne E Gold, [place]      . rosuvastatin (CRESTOR) tablet 20 mg  20 mg Oral q1800 Wayne E Gold, PA      . sodium bicarbonate injection 50 mEq  50 mEq Intravenous Once Melrose Nakayama, MD   50 mEq at 12/03/11 1929  . sodium chloride  0.9 % injection 3 mL  3 mL Intravenous Q12H Wayne E Gold, PA   3 mL at 12/04/11 1030  . sodium chloride 0.9 % injection 3 mL  3  mL Intravenous PRN Jermanie Giovanni, PA      . traMADol Veatrice Bourbon) tablet 50 mg  50 mg Oral Q6H PRN Tharon Aquas Trigt III, MD      . vancomycin (VANCOCIN) 1,000 mg in sodium chloride 0.9 % 100 mL IVPB  1,000 mg Intravenous Once Bell Giovanni, PA   1,000 mg at 12/03/11 2000  . DISCONTD: 0.9 %  sodium chloride infusion  250 mL Intravenous PRN Erlene Quan, PA      . DISCONTD: aspirin EC tablet 81 mg  81 mg Oral Daily Baruch Gouty, PHARMD   81 mg at 12/02/11 1007  . DISCONTD: carvedilol (COREG) tablet 6.25 mg  6.25 mg Oral BID WC Leonie Green Harding   6.25 mg at 12/02/11 1806  . DISCONTD: ciprofloxacin (CIPRO) tablet 500 mg  500 mg Oral Q2000 Melrose Nakayama, MD   500 mg at 12/02/11 1951  . DISCONTD: heparin injection 5,000 Units  5,000 Units Subcutaneous Q8H Doree Fudge, MD   5,000 Units at 12/02/11 2132  . DISCONTD: insulin aspart (novoLOG) injection 0-24 Units  0-24 Units Subcutaneous Q4H Melrose Nakayama, MD      . DISCONTD: insulin regular (NOVOLIN R,HUMULIN R) 1 Units/mL in sodium chloride 0.9 % 100 mL infusion   Intravenous Continuous Layla Giovanni, PA   0.2 Units/hr at 12/03/11 1549  . DISCONTD: isosorbide mononitrate (IMDUR) 24 hr tablet 30 mg  30 mg Oral Daily Donetta Potts, MD   30 mg at 12/02/11 1007  . DISCONTD: LORazepam (ATIVAN) tablet 0.5-1 mg  0.5-1 mg Oral Q4H PRN Melrose Nakayama, MD      . DISCONTD: metoprolol tartrate (LOPRESSOR) 25 mg/10 mL oral suspension 12.5 mg  12.5 mg Per Tube BID Cowen Giovanni, PA      . DISCONTD: metoprolol tartrate (LOPRESSOR) tablet 12.5 mg  12.5 mg Oral BID Gabriella Giovanni, [place]      . DISCONTD: morphine 2 MG/ML injection 2 mg  2 mg Intravenous Q2H PRN Isaiah Serge, NP   2 mg at 11/21/11 1600  . DISCONTD: nitroGLYCERIN (NITROSTAT) SL tablet 0.4 mg  0.4 mg Sublingual Q5 min PRN Isaiah Serge, NP      . DISCONTD: pantoprazole (PROTONIX) EC tablet 40 mg  40 mg Oral Q0600 Doreene Burke [place], [place]   40 mg at 12/03/11 0449  . DISCONTD: rosuvastatin  (CRESTOR) tablet 20 mg  20 mg Oral q1800 Doreene Burke [place], [place]   20 mg at 12/02/11 1806  . DISCONTD: sodium chloride 0.9 % injection 3 mL  3 mL Intravenous PRN Erlene Quan, PA      . DISCONTD: zolpidem (AMBIEN) tablet 5 mg  5 mg Oral QHS PRN Isaiah Serge, NP   5 mg at 12/02/11 2132   Facility-Administered Medications Ordered in Other Encounters  Medication Dose Route Frequency Provider Last Rate Last Dose  . DISCONTD: 0.9 %  sodium chloride infusion    Continuous PRN Kelly A. Tonkin      . DISCONTD: 0.9 %  sodium chloride infusion    Continuous PRN Kelly A. Tonkin      . DISCONTD: 0.9 %  sodium chloride infusion    Continuous PRN Kelly A. Tonkin      . DISCONTD: albumin human 5 %  solution    Continuous PRN Kelly A. Tonkin      . DISCONTD: aminocaproic acid (AMICAR) 10 g in sodium chloride 0.9 % 140 mL infusion  10 g  Continuous PRN Kelly A. Tonkin 14 mL/hr at 12/03/11 0925 1 g/hr at 12/03/11 0925  . DISCONTD: artificial tears (LACRILUBE) ophthalmic ointment    PRN Kelly A. Tonkin   1 application at [id number]  . DISCONTD: cefUROXime (ZINACEF) 1.5 g in dextrose 5 % 50 mL IVPB  1.5 g  Continuous PRN Kelly A. Tonkin   1.5 g at 12/03/11 0826  . DISCONTD: dexmedetomidine (PRECEDEX) 200 mcg in sodium chloride 0.9 % 50 mL infusion  200 mcg  Continuous PRN Kelly A. Tonkin 12.8 mL/hr at 12/03/11 1150 0.7 mcg/kg/hr at 12/03/11 1150  . DISCONTD: DOPamine (INTROPIN) 400 mg in dextrose 5 % 250 mL infusion    Continuous PRN Kelly A. Tonkin 13.7 mL/hr at 12/03/11 1220 5 mcg/kg/min at 12/03/11 1220  . DISCONTD: etomidate (AMIDATE) injection    PRN Claiborne Billings A. Tonkin   30 mg at 12/03/11 0804  . DISCONTD: fentaNYL (SUBLIMAZE) injection    PRN Claiborne Billings A. Tonkin   250 mcg at 12/03/11 1326  . DISCONTD: heparin injection    PRN Mateo Flow Mumm   43,000 Units at 12/03/11 1017  . DISCONTD: hetastarch in lactated electrolyte (HEXTEND) 6 % infusion    Continuous PRN Kelly A. Tonkin      . DISCONTD: insulin regular (NOVOLIN  R,HUMULIN R) 100 Units in sodium chloride 0.9 % 100 mL infusion  100 Units  Continuous PRN Kelly A. Tonkin 1 mL/hr at 12/03/11 1259 1 Units/hr at 12/03/11 1259  . DISCONTD: midazolam (VERSED) 5 MG/5ML injection    PRN Kelly A. Tonkin   4 mg at 12/03/11 1204  . DISCONTD: nitroGLYCERIN 0.2 mg/mL in dextrose 5 % infusion    Continuous PRN Kelly A. Tonkin 1.5 mL/hr at 12/03/11 1249 5 mcg/min at 12/03/11 1249  . DISCONTD: phenylephrine (NEO-SYNEPHRINE) 0.04 mg/mL in dextrose 5 % 250 mL infusion  10 mg  Continuous PRN Kelly A. Tonkin 30 mL/hr at 12/03/11 1340 20 mcg/min at 12/03/11 1340  . DISCONTD: protamine injection    PRN Claiborne Billings A. Tonkin   100 mg at 12/03/11 1246  . DISCONTD: rocuronium (ZEMURON) injection    PRN Claiborne Billings A. Tonkin   50 mg at 12/03/11 0804  . DISCONTD: vancomycin (VANCOCIN) 1,000 mg in sodium chloride 0.9 % 250 mL IVPB  1,000 mg Intravenous Continuous PRN Kelly A. Tonkin   1,250 mg at 12/03/11 0757  . DISCONTD: vecuronium (NORCURON) injection    PRN Claiborne Billings A. Tonkin   10 mg at 12/03/11 1216    PE: General appearance: alert, cooperative and moderate distress Lungs: Clear to auscutation anteriorly.  Decreased effort. Heart: regular rate and rhythm, S1, S2 normal, no murmur, click, rub or gallop Extremities: 1+ right LEE.  No left LEE. Pulses: 2+ left Radial, 2+ left DP,  deminished right DP.  right foot cooler than left.  Lab Results:   Basename 12/04/11 0430 12/03/11 2021 12/03/11 2000 12/03/11 1400  WBC 6.7 -- 8.7 5.7  HGB 5.6* 10.2* 7.3* --  HCT 16.8* 30.0* 21.4* --  PLT 93* -- 126* 100*   BMET  Basename 12/04/11 0430 12/03/11 2021 12/03/11 2000 12/03/11 1405 12/03/11 0545 12/02/11 0558  NA 143 146* -- 144 -- --  K 4.7 4.9 -- 4.0 -- --  CL 117* 115* -- -- 104 --  CO2 17* -- -- --  21 21  GLUCOSE 113* 116* -- 84 -- --  BUN 41* 37* -- -- 52* --  CREATININE 2.79* 2.70* 2.55* -- -- --  CALCIUM 6.7* -- -- -- 9.5 9.0   PT/INR  Basename 12/03/11 1400  LABPROT 19.1*  INR  1.57*   Studies/Results: CXR, 12/04/11 PORTABLE CHEST - 1 VIEW  Comparison: 12/03/2011.  Findings: Removal of enteric tube and endotracheal tube. Swan-Ganz  catheter is in the right pulmonary artery. Volume loss is present  bilaterally. Mediastinal drain and thoracostomy tubes remain  present. Elevation of the left hemidiaphragm is present. There  may be a left pleural fluid. This is difficult to assess because  of elevation of the left hemidiaphragm. The diaphragm is outlined  by gas distended colon. There is no gross opacification of either  side of the chest. Widening of the mediastinum is likely due to  lower lung volumes.  IMPRESSION:  1. Interval removal of endotracheal tube and enteric tube.  2. Low volume chest.  3. Unchanged mediastinal drains.  4. Postoperative changes of CABG.  5. No convincing evidence of intrathoracic bleed. Widening of the  mediastinum is likely secondary to low lung volumes in this  extubated patient.    Assessment/Plan  Principal Problem:  *S/P CABG x 5,  (LIMA-LAD; SEQSVG-RAMUS-OM; SVG-PD; SVG-DIAG) Active Problems:  Ischemic cardiomyopathy - Acute  CAD (coronary artery disease), severe 3 vessel at cath 11/30/11  Acute respiratory failure requiring BiPap - Secondary to Acute Diastolic (+/- Systolic) CHF in setting of NSTEMI  Tachycardia  HTN (hypertension)  NSTEMI (non-ST elevated myocardial infarction),acute MI  Alternating LBBB and RBBB  Renal failure, acute  Hypotension, transient  Dyslipidemia, LDL 180 Anemia-ABLA  Plan:  S/P CABG x 5.  POD #1.  S/P two units PRBCs.  BP and HR stable and controlled.  Continue to monitor.   LOS: 13 days    HAGER,BRYAN W 12/04/2011 1:29 PM  I have seen and examined the patient along with Brett Canales., PA.  I have reviewed the chart, notes and new data.  I agree with PA's note.  Key new complaints: Now sleeping comfortably after analgetics  Key examination changes: no rub, no active bleeding  noted; excellent UO Key new findings / data: creat only slightly increased postop  ECG -NSR, LBBB  PLAN: Monitor for CHF and AFib - high risk for both. Gradually resume vasodilator Rx (hydralazine-nitrates). Unable to take ACEi or ARB due to poor and volatile renal function. If possible increase beta blocker dose as BP increases postop. Prior to hospital dc will repeat limited echo. If EF still <40%, dc home with LifeVest and reevaluate for AICD after 90 days.  Sanda Klein, MD, [place] [phone] 12/04/2011, 3:04 PM

## 2011-12-04 NOTE — Op Note (Signed)
NAME:  Francisco [name], Francisco [name]                        ACCOUNT NO.:  MEDICAL RECORD NO.:  [id number]  LOCATION:                                 FACILITY:  PHYSICIAN:  Ala Dach, M.D.DATE OF BIRTH:  1946-05-01  DATE OF PROCEDURE:  12/03/2011 DATE OF DISCHARGE:                              OPERATIVE REPORT   Intraoperative transesophageal echocardiographic report.  INDICATIONS FOR PROCEDURE:  Francisco [name] is a 66 year old gentleman who presents today for coronary artery bypass grafting and possible mitral valve repair.  We have been asked to place the TEE probe for evaluation of the mitral valve in particular as well as cardiac structures.  The patient was brought to the holding area the morning of surgery where under local anesthesia with sedation, radial arterial and pulmonary artery lines are inserted.  He was then taken to the OR for routine induction of general anesthesia after which the TEE probe is prepared and then passed oropharyngeally into the stomach, then slightly withdrawn for imaging of the cardiac structures.  PRE CARDIOPULMONARY BYPASS TEE EXAMINATION:  Left ventricle:  The left ventricular chamber is seen initially in the short-axis view.  There is global hypokinesis appreciated.  The ejection fraction is estimated to be only about 30%.  There are areas of significant hypokinesis anteriorly, anteroseptally in the septal area and inferoseptal area. The lateral and inferolateral walls do show some contractile pattern in the short-axis view.  Multiple views of the left ventricular chamber are obtained. Mitral valve:  Initially, the mitral valve is seen in the 4-chamber view.  There appears to be normal mitral valve apparatus with thin, compliant, mobile leaflets.  These appear to coapt in the appropriate position, just below the level of the anulus.  Color Doppler in the four- chamber view reveals only trivial to very mild mitral regurgitant flow. Multiple views are  obtained as well as commissural views, which indicate continued mild or trivial mitral regurgitant flow. Right ventricle, tricuspid valve, and right atrium are normal structures and chambers.  Left atrium is interrogated and no masses are appreciated.  The interatrial septum is intact.  The patient is placed on cardiopulmonary bypass.  Hypothermia is initiated.  Cardiac artery bypass grafting is carried out by Dr. Roxan Hockey.  The patient is separated initially from cardiopulmonary bypass with the initial attempt.  POST CARDIOPULMONARY BYPASS TEE EXAMINATION (LIMITED EXAM):  Mitral valve:  The mitral valve is again interrogated in the early post bypass period, which reveals very mild mitral regurgitant flow remains.  There is no change from previous. Left ventricle:  The left ventricular chamber is again seen in the short- axis view.  While there does remain hypokinesis in the anterior and anteroseptal and inferoseptal areas, the overall contractile pattern appears improved compared to the prebypass period.  Multiple images are obtained of the left ventricle in both long and short-axis views.  The rest of the cardiac examination is unchanged, and the patient was returned to the cardiac intensive care unit in stable condition.          ______________________________ Ala Dach, M.D.     JTM/MEDQ  D:  12/03/2011  T:  12/04/2011  Job:  [id number]

## 2011-12-05 ENCOUNTER — Inpatient Hospital Stay (HOSPITAL_COMMUNITY): Payer: Medicare Other

## 2011-12-05 LAB — GLUCOSE, CAPILLARY
Glucose-Capillary: 86 mg/dL (ref 70–99)
Glucose-Capillary: 125 mg/dL — ABNORMAL HIGH (ref 70–99)
Glucose-Capillary: 108 mg/dL — ABNORMAL HIGH (ref 70–99)
Glucose-Capillary: 130 mg/dL — ABNORMAL HIGH (ref 70–99)

## 2011-12-05 LAB — CBC
MCH: 28.1 pg (ref 26.0–34.0)
MCHC: 33.6 g/dL (ref 30.0–36.0)
MCV: 83.5 fL (ref 78.0–100.0)
Platelets: 115 10*3/uL — ABNORMAL LOW (ref 150–400)
RDW: 15.8 % — ABNORMAL HIGH (ref 11.5–15.5)

## 2011-12-05 LAB — BASIC METABOLIC PANEL
CO2: 18 mEq/L — ABNORMAL LOW (ref 19–32)
Calcium: 7.8 mg/dL — ABNORMAL LOW (ref 8.4–10.5)
Creatinine, Ser: 3.61 mg/dL — ABNORMAL HIGH (ref 0.50–1.35)
Glucose, Bld: 114 mg/dL — ABNORMAL HIGH (ref 70–99)

## 2011-12-05 MED ORDER — INSULIN ASPART 100 UNIT/ML ~~LOC~~ SOLN
0.0000 [IU] | SUBCUTANEOUS | Status: DC
  Administered 2011-12-05: 2 [IU] via SUBCUTANEOUS

## 2011-12-05 NOTE — Progress Notes (Signed)
[facility]  Subjective: In quite a bit of pain.  Sitting in a chair with family in the room helping.  Objective: Vital signs in last 24 hours: Temp:  [97.3 F (36.3 C)-98.7 F (37.1 C)] 97.9 F (36.6 C) (01/12 0747) Pulse Rate:  [70-92] 71  (01/12 0700) Resp:  [9-32] 13  (01/12 0700) BP: (85-156)/(54-102) 97/60 mmHg (01/12 0700) SpO2:  [95 %-100 %] 98 % (01/12 0700) Arterial Line BP: (88-165)/(46-90) 121/51 mmHg (01/12 0700) Weight:  [81.7 kg (180 lb 1.9 oz)] 81.7 kg (180 lb 1.9 oz) (01/12 0600) Last BM Date: 11/30/11  General appearance: alert, cooperative, moderate distress and due to "feeling sore" in chest & epigastrium. Neck: no adenopathy, no carotid bruit, no JVD, supple, symmetrical, trachea midline, thyroid not enlarged, symmetric, no tenderness/mass/nodules and RIJ line in place - dressing no longer sticking - ? how much longer this is needed) Lungs: rales bibasilar and poor effort due to soreness.  does not want to cough. Heart: regular rate and rhythm, S1: normal intensity, S2: physiologically split, (due to BBB), no S3 or S4, no click and friction rub heard throughout precordium (faint) Abdomen: normal findings: bowel sounds normal, no bruits heard and no organomegaly and abnormal findings:  moderate tenderness in the epigastrium Extremities: extremities normal, atraumatic, no cyanosis or edema Pulses: mildly diminished bilateral LE -~1+, R foot less cool. Neurologic: Mental status: Alert, oriented, thought content appropriate Motor: grossly normal MSK -- chest wall extremely tender  Intake/Output from previous day: 01/11 0701 - 01/12 0700 In: 2723.4 [P.O.:800; I.V.:1348.9; Blood:574.6] Out: 1480 [Urine:1410; Chest Tube:70] Intake/Output this shift:    Medications Current Facility-Administered Medications  Medication Dose Route Frequency Provider Last Rate Last Dose  . 0.45 % sodium chloride infusion   Intravenous Continuous Velmer Giovanni, PA 20 mL/hr at 12/03/11 2000    . 0.9 %  sodium chloride infusion   Intravenous Continuous Nainoa Giovanni, PA 20 mL/hr at 12/05/11 0600    . 0.9 %  sodium chloride infusion  250 mL Intravenous Continuous Vedanth Giovanni, PA      . acetaminophen (TYLENOL) tablet 1,000 mg  1,000 mg Oral Q6H Wayne E Gold, PA   1,000 mg at 12/05/11 [id number]   Or  . acetaminophen (TYLENOL) solution 975 mg  975 mg Per Tube Q6H Xaine Giovanni, PA   975 mg at 12/04/11 0015  . albumin human 5 % solution 250 mL  250 mL Intravenous Q15 min PRN Milt Giovanni, PA   250 mL at 12/04/11 0102  . aspirin EC tablet 325 mg  325 mg Oral Daily Tay Giovanni, [place]   325 mg at 12/04/11 1021   Or  . aspirin chewable tablet 324 mg  324 mg Per Tube Daily Nikolis Giovanni, PA      . bisacodyl (DULCOLAX) EC tablet 10 mg  10 mg Oral Daily Sayan Giovanni, PA   10 mg at 12/04/11 1021   Or  . bisacodyl (DULCOLAX) suppository 10 mg  10 mg Rectal Daily Tamika Giovanni, PA      . carvedilol (COREG) tablet 3.125 mg  3.125 mg Oral BID WC Melrose Nakayama, MD   3.125 mg at 12/05/11 0807  . ciprofloxacin (CIPRO) tablet 500 mg  500 mg Oral Q breakfast Melrose Nakayama, MD   500 mg at 12/05/11 [id number]  . dexmedetomidine (PRECEDEX) 200 mcg in sodium chloride 0.9 % 50 mL infusion  0.1-0.7 mcg/kg/hr Intravenous  Continuous Thadd Giovanni, PA   0.1 mcg/kg/hr at 12/03/11 1724  . docusate sodium (COLACE) capsule 200 mg  200 mg Oral Daily Mattheu Giovanni, PA   200 mg at 12/04/11 1021  . DOPamine (INTROPIN) 800 mg in dextrose 5 % 250 mL infusion  2-10 mcg/kg/min Intravenous Titrated Wayne E Gold, PA 4.1 mL/hr at 12/05/11 0600 3 mcg/kg/min at 12/05/11 0600  . famotidine (PEPCID) IVPB 20 mg  20 mg Intravenous Q12H Wayne E Gold, PA   20 mg at 12/03/11 1400  . fentaNYL (SUBLIMAZE) injection 50-100 mcg  50-100 mcg Intravenous PRN Delma Freeze, MD      . insulin aspart (novoLOG) injection 0-24 Units  0-24 Units Subcutaneous Q4H Melrose Nakayama, MD   2 Units at 12/04/11  2054  . insulin regular bolus via infusion 0-10 Units  0-10 Units Intravenous TID WC Wayne E Gold, PA      . lactated ringers infusion   Intravenous Continuous Delma Freeze, MD      . lactated ringers infusion   Intravenous Continuous Norwin Giovanni, PA      . magnesium sulfate IVPB 4 g 100 mL  4 g Intravenous Once Wayne E Gold, [place]      . metoprolol (LOPRESSOR) injection 2.5-5 mg  2.5-5 mg Intravenous Q2H PRN Nachman Giovanni, PA      . midazolam (VERSED) injection 1-2 mg  1-2 mg Intravenous PRN Delma Freeze, MD      . midazolam (VERSED) injection 2 mg  2 mg Intravenous Q1H PRN Fredric Giovanni, PA      . midazolam (VERSED) injection 2 mg  2 mg Intravenous Once Melrose Nakayama, MD      . morphine 4 MG/ML injection 2-5 mg  2-5 mg Intravenous Q1H PRN Quandre Giovanni, PA   2 mg at 12/04/11 1147  . nitroGLYCERIN 0.2 mg/mL in dextrose 5 % infusion  0-100 mcg/min Intravenous Continuous Raywood Giovanni, PA   5 mcg/min at 12/04/11 1130  . ondansetron (ZOFRAN) injection 4 mg  4 mg Intravenous Q6H PRN Johann Giovanni, PA      . oxyCODONE (Oxy IR/ROXICODONE) immediate release tablet 5-10 mg  5-10 mg Oral Q3H PRN Torin Giovanni, PA   10 mg at 12/04/11 1801  . pantoprazole (PROTONIX) EC tablet 40 mg  40 mg Oral Q1200 Wayne E Gold, PA      . phenylephrine (NEO-SYNEPHRINE) 20,000 mcg in dextrose 5 % 250 mL infusion  0-100 mcg/min Intravenous Continuous Shalev Giovanni, PA   2 mcg/min at 12/05/11 0500  . polyvinyl alcohol (LIQUIFILM TEARS) 1.4 % ophthalmic solution 1 drop  1 drop Both Eyes PRN Melrose Nakayama, MD      . rosuvastatin (CRESTOR) tablet 20 mg  20 mg Oral q1800 Lysle Giovanni, PA   20 mg at 12/04/11 1802  . sodium chloride 0.9 % injection 3 mL  3 mL Intravenous Q12H Wayne E Gold, PA   3 mL at 12/04/11 2200  . sodium chloride 0.9 % injection 3 mL  3 mL Intravenous PRN Jacquan Giovanni, PA      . traMADol Veatrice Bourbon) tablet 50 mg  50 mg Oral Q6H PRN Ivin Poot III, MD   50 mg at 12/05/11 0916    Lab  Results:   Basename 12/05/11 0431 12/04/11 1719 12/04/11 1715 12/04/11 0430  WBC 10.7* -- 9.7 6.7  HGB 8.0* 7.8* 8.1* --  HCT 23.8* 23.0*  23.8* --  PLT 115* -- 112* 93*   BMET  Basename 12/05/11 0431 12/04/11 1719 12/04/11 1715 12/04/11 0430 12/03/11 0545  NA 137 143 -- 143 --  K 5.2* 5.3* -- 4.7 --  CL 109 114* -- 117* --  CO2 18* -- -- 17* 21  GLUCOSE 114* 136* -- 113* --  BUN 49* 42* -- 41* --  CREATININE 3.61* 3.30* 3.26* -- --  CALCIUM 7.8* -- -- 6.7* 9.5   PT/INR  Basename 12/03/11 1400  LABPROT 19.1*  INR 1.57*   Studies/Results: CXR, 12/05/11: Removal of [place], Left CT, Mediastinal drain. RIJ line in place.  No Ptx, small Bilateral effusions, with ateletasis.  Assessment/Plan  Principal Problem:  *S/P CABG x 5,  (LIMA-LAD; SEQSVG-RAMUS-OM; SVG-PD; SVG-DIAG) Active Problems:  Presenting Event: NSTEMI (non-ST elevated myocardial infarction),acute MI   CAD (coronary artery disease), severe 3 vessel at cath 11/30/11  Ischemic cardiomyopathy -   Alternating LBBB and RBBB  AcuteRenal failure, acute (on chronic)  Anemia-ABLA - s/p 2 U PRBC HgB with appropriate bump from 5.6 to 8; may need more given renal failure (but would do so with caution given low EF)   HTN (hypertension)   Dyslipidemia, LDL 180  Resolved: Acute respiratory failure requiring BiPap - Secondary to Acute Diastolic (+/- Systolic) CHF in setting of NSTEMI - Resolved Hypotension, transient - resolved Tachycardia   S/P CABG x 5.  POD #2.  . S/P two units PRBCs peri-op.  Hgb ~8.0 today BP low by cuff but 120-140s by A line -- stable;  and HR stable and controlled in NSR with few PVCs.    Key new complaints: In significant amount of pain.  Very uncomfortable with light touch.  Key examination changes: no rub, no active bleeding noted; excellent UO Key new findings / data: creat continues to drift up post-op, but not significantly.  Still making good urine.  No BM since 1/07.  May need bowel  regimen.  ECG -NSR, LBBB  PLAN:  Monitor for CHF and AFib - high risk for both.  Gradually resume vasodilator Rx (hydralazine-nitrates) - although BP not yet ready for titration. Unable to take ACEi or ARB due to poor and volatile renal function. If possible increase beta blocker dose as BP increases postop -- would increase to 6.25mg  bid today if BP stable, then add vasodilators.  Continue with ASA & Statin  Prior to hospital dc will repeat limited echo. If EF still <40%, dc home with LifeVest and reevaluate for AICD after 90 days.    LOS: 14 days    Delance Weide W 12/05/2011 9:26 AM

## 2011-12-05 NOTE — Progress Notes (Signed)
2 Days Post-Op Procedure(s) (LRB): CORONARY ARTERY BYPASS GRAFTING (CABG) (N/A) Subjective:                            [place]            [employer] [address]          [phone]     Walked 150 ft BID Creat remains 3.5 on renal dopamine Some confusion with pain meds  Objective: Vital signs in last 24 hours: Temp:  [97.6 F (36.4 C)-98.9 F (37.2 C)] 98.9 F (37.2 C) (01/12 1606) Pulse Rate:  [70-83] 74  (01/12 2000) Cardiac Rhythm:  [-] Normal sinus rhythm (01/12 2000) Resp:  [9-23] 19  (01/12 2000) BP: (87-146)/(54-94) 94/67 mmHg (01/12 2000) SpO2:  [95 %-100 %] 99 % (01/12 2000) Arterial Line BP: (88-166)/(46-79) 151/74 mmHg (01/12 1000) Weight:  [180 lb 1.9 oz (81.7 kg)] 180 lb 1.9 oz (81.7 kg) (01/12 0600)  Hemodynamic parameters for last 24 hours:    Intake/Output from previous day: 01/11 0701 - 01/12 0700 In: 2723.4 [P.O.:800; I.V.:1348.9; Blood:574.6] Out: 1480 [Urine:1410; Chest Tube:70] Intake/Output this shift: Total I/O In: 24.1 [I.V.:24.1] Out: 50 [Urine:50]    Lab Results:  Basename 12/05/11 0431 12/04/11 1719 12/04/11 1715  WBC 10.7* -- 9.7  HGB 8.0* 7.8* --  HCT 23.8* 23.0* --  PLT 115* -- 112*   BMET:  Basename 12/05/11 0431 12/04/11 1719 12/04/11 0430  NA 137 143 --  K 5.2* 5.3* --  CL 109 114* --  CO2 18* -- 17*  GLUCOSE 114* 136* --  BUN 49* 42* --  CREATININE 3.61* 3.30* --  CALCIUM 7.8* -- 6.7*    PT/INR:  Basename 12/03/11 1400  LABPROT 19.1*  INR 1.57*   ABG    Component Value Date/Time   PHART 7.343* 12/03/2011 2017   HCO3 18.6* 12/03/2011 2017   TCO2 19 12/04/2011 1719   ACIDBASEDEF 6.0* 12/03/2011 2017   O2SAT 97.0 12/03/2011 2017   CBG (last 3)   Basename 12/05/11 2006 12/05/11 1605 12/05/11 1148  GLUCAP 130* 108* 125*    Assessment/Plan: S/P Procedure(s) (LRB): CORONARY ARTERY BYPASS GRAFTING (CABG) (N/A) Cont current care   LOS: 14 days    VAN TRIGT III,Jessenya Berdan 12/05/2011

## 2011-12-06 ENCOUNTER — Inpatient Hospital Stay (HOSPITAL_COMMUNITY): Payer: Medicare Other

## 2011-12-06 LAB — COMPREHENSIVE METABOLIC PANEL
ALT: 16 U/L (ref 0–53)
AST: 24 U/L (ref 0–37)
Albumin: 2.1 g/dL — ABNORMAL LOW (ref 3.5–5.2)
Alkaline Phosphatase: 51 U/L (ref 39–117)
BUN: 54 mg/dL — ABNORMAL HIGH (ref 6–23)
CO2: 20 mEq/L (ref 19–32)
Calcium: 7.9 mg/dL — ABNORMAL LOW (ref 8.4–10.5)
Chloride: 107 mEq/L (ref 96–112)
Creatinine, Ser: 3.46 mg/dL — ABNORMAL HIGH (ref 0.50–1.35)
GFR calc Af Amer: 20 mL/min — ABNORMAL LOW (ref >90)
GFR calc non Af Amer: 17 mL/min — ABNORMAL LOW (ref >90)
Glucose, Bld: 109 mg/dL — ABNORMAL HIGH (ref 70–99)
Potassium: 4.5 mEq/L (ref 3.5–5.1)
Sodium: 135 mEq/L (ref 135–145)
Total Bilirubin: 0.4 mg/dL (ref 0.3–1.2)
Total Protein: 5 g/dL — ABNORMAL LOW (ref 6.0–8.3)

## 2011-12-06 LAB — CBC
HCT: 23.1 % — ABNORMAL LOW (ref 39.0–52.0)
Hemoglobin: 7.9 g/dL — ABNORMAL LOW (ref 13.0–17.0)
MCH: 28.8 pg (ref 26.0–34.0)
MCHC: 34.2 g/dL (ref 30.0–36.0)
MCV: 84.3 fL (ref 78.0–100.0)
Platelets: 125 10*3/uL — ABNORMAL LOW (ref 150–400)
RBC: 2.74 MIL/uL — ABNORMAL LOW (ref 4.22–5.81)
RDW: 15.8 % — ABNORMAL HIGH (ref 11.5–15.5)
WBC: 9.6 10*3/uL (ref 4.0–10.5)

## 2011-12-06 LAB — GLUCOSE, CAPILLARY: Glucose-Capillary: 98 mg/dL (ref 70–99)

## 2011-12-06 MED ORDER — FERROUS SULFATE 325 (65 FE) MG PO TABS
325.0000 mg | ORAL_TABLET | Freq: Every day | ORAL | Status: AC
  Administered 2011-12-07 – 2011-12-11 (×5): 325 mg via ORAL
  Filled 2011-12-06 (×5): qty 1

## 2011-12-06 MED ORDER — INSULIN ASPART 100 UNIT/ML ~~LOC~~ SOLN
0.0000 [IU] | Freq: Three times a day (TID) | SUBCUTANEOUS | Status: DC
  Administered 2011-12-07: 2 [IU] via SUBCUTANEOUS
  Administered 2011-12-07: 4 [IU] via SUBCUTANEOUS
  Administered 2011-12-08: 2 [IU] via SUBCUTANEOUS

## 2011-12-06 NOTE — Progress Notes (Signed)
[facility]  Subjective: Pain has improved.  He was able to sleep some last night.  In the chair having breakfast now.  Objective: Vital signs in last 24 hours: Temp:  [97.7 F (36.5 C)-98.9 F (37.2 C)] 97.7 F (36.5 C) (01/13 0754) Pulse Rate:  [70-85] 80  (01/13 0800) Resp:  [10-23] 16  (01/13 0800) BP: (84-146)/(59-94) 110/71 mmHg (01/13 0800) SpO2:  [95 %-100 %] 100 % (01/13 0800) Arterial Line BP: (126-166)/(57-79) 151/74 mmHg (01/12 1000) Weight:  [82.4 kg (181 lb 10.5 oz)] 82.4 kg (181 lb 10.5 oz) (01/13 0600) Last BM Date: 11/30/11  Intake/Output from previous day: 01/12 0701 - 01/13 0700 In: 1052.5 [P.O.:450; I.V.:602.5] Out: 1005 [Urine:1005] Intake/Output this shift: Total I/O In: 24.1 [I.V.:24.1] Out: -   Medications Current Facility-Administered Medications  Medication Dose Route Frequency Provider Last Rate Last Dose  . 0.45 % sodium chloride infusion   Intravenous Continuous Didier Giovanni, PA 20 mL/hr at 12/03/11 2000    . 0.9 %  sodium chloride infusion   Intravenous Continuous Male Giovanni, PA 20 mL/hr at 12/06/11 0800    . 0.9 %  sodium chloride infusion  250 mL Intravenous Continuous Manan Giovanni, PA      . acetaminophen (TYLENOL) tablet 1,000 mg  1,000 mg Oral Q6H Wayne E Gold, PA   1,000 mg at 12/06/11 [id number]   Or  . acetaminophen (TYLENOL) solution 975 mg  975 mg Per Tube Q6H Criag Giovanni, PA   975 mg at 12/04/11 0015  . aspirin EC tablet 325 mg  325 mg Oral Daily Ashford Giovanni, PA   325 mg at 12/05/11 1002   Or  . aspirin chewable tablet 324 mg  324 mg Per Tube Daily Kervin Giovanni, PA      . bisacodyl (DULCOLAX) EC tablet 10 mg  10 mg Oral Daily Sherri Giovanni, PA   10 mg at 12/05/11 1002   Or  . bisacodyl (DULCOLAX) suppository 10 mg  10 mg Rectal Daily Avigdor Giovanni, PA      . carvedilol (COREG) tablet 3.125 mg  3.125 mg Oral BID WC Melrose Nakayama, MD   3.125 mg at 12/06/11 0747  . ciprofloxacin (CIPRO) tablet 500 mg   500 mg Oral Q breakfast Melrose Nakayama, MD   500 mg at 12/06/11 0747  . dexmedetomidine (PRECEDEX) 200 mcg in sodium chloride 0.9 % 50 mL infusion  0.1-0.7 mcg/kg/hr Intravenous Continuous Hrithik Giovanni, PA   0.1 mcg/kg/hr at 12/03/11 1724  . docusate sodium (COLACE) capsule 200 mg  200 mg Oral Daily Cobain Giovanni, PA   200 mg at 12/05/11 1002  . DOPamine (INTROPIN) 800 mg in dextrose 5 % 250 mL infusion  2-10 mcg/kg/min Intravenous Titrated Wayne E Gold, PA 4.1 mL/hr at 12/06/11 0800 3 mcg/kg/min at 12/06/11 0800  . insulin aspart (novoLOG) injection 0-24 Units  0-24 Units Subcutaneous Q4H Ivin Poot III, MD   2 Units at 12/05/11 2302  . insulin regular bolus via infusion 0-10 Units  0-10 Units Intravenous TID WC Wayne E Gold, PA      . lactated ringers infusion   Intravenous Continuous Delma Freeze, MD      . lactated ringers infusion   Intravenous Continuous Majid Giovanni, PA      . magnesium sulfate IVPB 4 g 100 mL  4 g Intravenous Once Semir Giovanni, [place]      .  metoprolol (LOPRESSOR) injection 2.5-5 mg  2.5-5 mg Intravenous Q2H PRN Harlyn Giovanni, PA      . morphine 4 MG/ML injection 2-5 mg  2-5 mg Intravenous Q1H PRN Kouper Giovanni, PA   2 mg at 12/04/11 1147  . nitroGLYCERIN 0.2 mg/mL in dextrose 5 % infusion  0-100 mcg/min Intravenous Continuous Leibish Giovanni, PA   5 mcg/min at 12/04/11 1130  . ondansetron (ZOFRAN) injection 4 mg  4 mg Intravenous Q6H PRN Maximillion Giovanni, PA      . pantoprazole (PROTONIX) EC tablet 40 mg  40 mg Oral Q1200 Wayne E Gold, PA   40 mg at 12/05/11 1246  . phenylephrine (NEO-SYNEPHRINE) 20,000 mcg in dextrose 5 % 250 mL infusion  0-100 mcg/min Intravenous Continuous Jaray Giovanni, PA   2 mcg/min at 12/05/11 0500  . polyvinyl alcohol (LIQUIFILM TEARS) 1.4 % ophthalmic solution 1 drop  1 drop Both Eyes PRN Melrose Nakayama, MD      . rosuvastatin (CRESTOR) tablet 20 mg  20 mg Oral q1800 Kaan Giovanni, PA   20 mg at 12/05/11 1810  . sodium chloride 0.9 %  injection 3 mL  3 mL Intravenous Q12H Keylan Giovanni, PA   3 mL at 12/05/11 2302  . sodium chloride 0.9 % injection 3 mL  3 mL Intravenous PRN Kellyn Giovanni, PA      . traMADol Veatrice Bourbon) tablet 50 mg  50 mg Oral Q6H PRN Tharon Aquas Trigt III, MD   50 mg at 12/06/11 0847  . DISCONTD: fentaNYL (SUBLIMAZE) injection 50-100 mcg  50-100 mcg Intravenous PRN Delma Freeze, MD      . DISCONTD: insulin aspart (novoLOG) injection 0-24 Units  0-24 Units Subcutaneous Q4H Melrose Nakayama, MD   2 Units at 12/05/11 1247  . DISCONTD: midazolam (VERSED) injection 1-2 mg  1-2 mg Intravenous PRN Delma Freeze, MD      . DISCONTD: midazolam (VERSED) injection 2 mg  2 mg Intravenous Q1H PRN Kejuan Giovanni, PA      . DISCONTD: midazolam (VERSED) injection 2 mg  2 mg Intravenous Once Melrose Nakayama, MD      . DISCONTD: oxyCODONE (Oxy IR/ROXICODONE) immediate release tablet 5-10 mg  5-10 mg Oral Q3H PRN Castiel Giovanni, PA   5 mg at 12/05/11 1339    PE: General appearance: alert, cooperative and no distress Lungs: Decreased BS bilaterally, rales Heart: regular rate and rhythm, S1, S2 normal, no murmur, click, or gallop -- slight rub Extremities: 1+ LEE on the right. Pulses: 2+ radials  Lab Results:   Basename 12/06/11 0416 12/05/11 0431 12/04/11 1719 12/04/11 1715  WBC 9.6 10.7* -- 9.7  HGB 7.9* 8.0* 7.8* --  HCT 23.1* 23.8* 23.0* --  PLT 125* 115* -- 112*   BMET  Basename 12/06/11 0416 12/05/11 0431 12/04/11 1719 12/04/11 0430  NA 135 137 143 --  K 4.5 5.2* 5.3* --  CL 107 109 114* --  CO2 20 18* -- 17*  GLUCOSE 109* 114* 136* --  BUN 54* 49* 42* --  CREATININE 3.46* 3.61* 3.30* --  CALCIUM 7.9* 7.8* -- 6.7*   PT/INR  Basename 12/03/11 1400  LABPROT 19.1*  INR 1.57*    Studies/Results: PORTABLE CHEST - 1 VIEW, 12/06/11 Comparison: 1 day prior  Findings: The right IJ Cordis sheath remains in place. Prior median  sternotomy. Mild cardiomegaly. Moderate left hemidiaphragm    elevation. No pneumothorax. Small bilateral pleural effusions.  Low lung volumes. Improved interstitial edema/venous congestion.  Persistent bibasilar airspace disease.  IMPRESSION:  1. Slight improvement in interstitial edema/venous congestion.  2. Otherwise similar low lung volumes with bibasilar atelectasis  and small bilateral pleural effusions.  Assessment/Plan  Principal Problem:  *S/P CABG x 5,  (LIMA-LAD; SEQSVG-RAMUS-OM; SVG-PD; SVG-DIAG) Active Problems:  Ischemic cardiomyopathy - Acute  CAD (coronary artery disease), severe 3 vessel at cath 11/30/11  Acute respiratory failure requiring BiPap - Secondary to Acute Diastolic (+/- Systolic) CHF in setting of NSTEMI  Tachycardia  HTN (hypertension)  NSTEMI (non-ST elevated myocardial infarction),acute MI  Alternating LBBB and RBBB  Renal failure, acute  Hypotension, transient  Dyslipidemia, LDL 180  Plan: POD #3.  BP/HR stable Monitor for CHF and AFib - high risk for both.  Gradually resume vasodilator Rx (hydralazine-nitrates) - although BP not yet ready for titration. Unable to take ACEi or ARB due to poor and volatile renal function. If possible increase beta blocker dose as BP increases postop -- would increase to 6.25mg  bid today if BP stable, then add vasodilators. Continue with ASA & Statin  Prior to hospital dc will repeat limited echo. If EF still <40%, dc home with LifeVest and reevaluate for AICD after 90 days.   LOS: 15 days    HAGER,BRYAN W 12/06/2011 8:51 AM  I have seen & examined Mr. Gomillion this morning & reviewed the chart.  He feels much better this morning & ready to walk. I agree with Bryan's note.    Continued post-op care.  Leonie Man, M.D., M.S. [facility] [address] [place], [place]  [address]  [phone]  12/06/2011 9:10 AM

## 2011-12-06 NOTE — Progress Notes (Signed)
Patient examined and record reviewed.Hemodynamics stable,labs satisfactory.Patient had stable day.Continue current care.  The patient has ambulated 3 times. He is voiding spontaneously adequate urine output. Dopamine has been weaned with stable vital signs maintaining sinus rhythm. Followup creatinine pending in a.m.        VAN TRIGT III,Chrishaun Sasso 12/06/2011

## 2011-12-06 NOTE — Progress Notes (Signed)
3 Days Post-Op Procedure(s) (LRB): CORONARY ARTERY BYPASS GRAFTING (CABG) (N/A) Subjective:                                          [name]            [name], [address]          [phone]       Objective  Stronger,less pain NSR adequate u/o:  -- will wean dopa as creat remaining 3.5 range Vital signs in last 24 hours: Temp:  [97.7 F (36.5 C)-98.9 F (37.2 C)] 97.7 F (36.5 C) (01/13 0754) Pulse Rate:  [70-85] 80  (01/13 0800) Cardiac Rhythm:  [-] Normal sinus rhythm (01/13 0800) Resp:  [11-23] 16  (01/13 0800) BP: (84-146)/(59-94) 110/71 mmHg (01/13 0800) SpO2:  [95 %-100 %] 100 % (01/13 0800) Arterial Line BP: (151)/(74) 151/74 mmHg (01/12 1000) Weight:  [181 lb 10.5 oz (82.4 kg)] 181 lb 10.5 oz (82.4 kg) (01/13 0600)  Hemodynamic parameters for last 24 hours:    Intake/Output from previous day: 01/12 0701 - 01/13 0700 In: 1052.5 [P.O.:450; I.V.:602.5] Out: 1005 [Urine:1005] Intake/Output this shift: Total I/O In: 24.1 [I.V.:24.1] Out: -     Lab Results:  Basename 12/06/11 0416 12/05/11 0431  WBC 9.6 10.7*  HGB 7.9* 8.0*  HCT 23.1* 23.8*  PLT 125* 115*   BMET:  Basename 12/06/11 0416 12/05/11 0431  NA 135 137  K 4.5 5.2*  CL 107 109  CO2 20 18*  GLUCOSE 109* 114*  BUN 54* 49*  CREATININE 3.46* 3.61*  CALCIUM 7.9* 7.8*    PT/INR:  Basename 12/03/11 1400  LABPROT 19.1*  INR 1.57*   ABG    Component Value Date/Time   PHART 7.343* 12/03/2011 2017   HCO3 18.6* 12/03/2011 2017   TCO2 19 12/04/2011 1719   ACIDBASEDEF 6.0* 12/03/2011 2017   O2SAT 97.0 12/03/2011 2017   CBG (last 3)   Basename 12/06/11 0752 12/05/11 2006 12/05/11 1605  GLUCAP 94 130* 108*    Assessment/Plan: S/P Procedure(s) (LRB): CORONARY ARTERY BYPASS GRAFTING (CABG) (N/A)    LOS: 15 days    Francisco [name],Francisco [name] 12/06/2011

## 2011-12-07 ENCOUNTER — Inpatient Hospital Stay (HOSPITAL_COMMUNITY): Payer: Medicare Other

## 2011-12-07 LAB — GLUCOSE, CAPILLARY
Glucose-Capillary: 80 mg/dL (ref 70–99)
Glucose-Capillary: 136 mg/dL — ABNORMAL HIGH (ref 70–99)

## 2011-12-07 LAB — CBC
HCT: 23.6 % — ABNORMAL LOW (ref 39.0–52.0)
Hemoglobin: 8 g/dL — ABNORMAL LOW (ref 13.0–17.0)
MCH: 28.7 pg (ref 26.0–34.0)
MCHC: 33.9 g/dL (ref 30.0–36.0)
MCV: 84.6 fL (ref 78.0–100.0)
Platelets: 146 10*3/uL — ABNORMAL LOW (ref 150–400)
RBC: 2.79 MIL/uL — ABNORMAL LOW (ref 4.22–5.81)
RDW: 15.8 % — ABNORMAL HIGH (ref 11.5–15.5)
WBC: 7.5 10*3/uL (ref 4.0–10.5)

## 2011-12-07 LAB — BASIC METABOLIC PANEL
BUN: 58 mg/dL — ABNORMAL HIGH (ref 6–23)
CO2: 16 mEq/L — ABNORMAL LOW (ref 19–32)
Calcium: 7.8 mg/dL — ABNORMAL LOW (ref 8.4–10.5)
Chloride: 108 mEq/L (ref 96–112)
Creatinine, Ser: 3.38 mg/dL — ABNORMAL HIGH (ref 0.50–1.35)
GFR calc Af Amer: 20 mL/min — ABNORMAL LOW (ref >90)
GFR calc non Af Amer: 18 mL/min — ABNORMAL LOW (ref >90)
Glucose, Bld: 84 mg/dL (ref 70–99)
Potassium: 4.5 mEq/L (ref 3.5–5.1)
Sodium: 135 mEq/L (ref 135–145)

## 2011-12-07 MED ORDER — MOVING RIGHT ALONG BOOK
Freq: Once | Status: AC
  Administered 2011-12-07: 12:00:00
  Filled 2011-12-07: qty 1

## 2011-12-07 MED ORDER — POVIDONE-IODINE 10 % EX SOLN
1.0000 "application " | Freq: Two times a day (BID) | CUTANEOUS | Status: DC
  Administered 2011-12-07 – 2011-12-11 (×9): 1 via TOPICAL
  Filled 2011-12-07: qty 15

## 2011-12-07 MED ORDER — FUROSEMIDE 40 MG PO TABS
40.0000 mg | ORAL_TABLET | Freq: Every day | ORAL | Status: DC
  Administered 2011-12-07 – 2011-12-10 (×4): 40 mg via ORAL
  Filled 2011-12-07 (×4): qty 1

## 2011-12-07 MED ORDER — ZOLPIDEM TARTRATE 5 MG PO TABS: 5.0000 mg | ORAL_TABLET | Freq: Every evening | ORAL | Status: DC | PRN

## 2011-12-07 MED ORDER — SODIUM CHLORIDE 0.9 % IJ SOLN
3.0000 mL | Freq: Two times a day (BID) | INTRAMUSCULAR | Status: DC
  Administered 2011-12-07 – 2011-12-10 (×6): 3 mL via INTRAVENOUS

## 2011-12-07 MED ORDER — OXYCODONE HCL 5 MG PO TABS: 5.0000 mg | ORAL_TABLET | ORAL | Status: DC | PRN

## 2011-12-07 MED ORDER — CLONIDINE HCL 0.2 MG PO TABS
0.2000 mg | ORAL_TABLET | Freq: Two times a day (BID) | ORAL | Status: DC
  Administered 2011-12-07 – 2011-12-11 (×9): 0.2 mg via ORAL
  Filled 2011-12-07 (×10): qty 1

## 2011-12-07 MED ORDER — SODIUM CHLORIDE 0.9 % IV SOLN: 250.0000 mL | INTRAVENOUS | Status: DC | PRN

## 2011-12-07 MED ORDER — AMLODIPINE BESYLATE 10 MG PO TABS
10.0000 mg | ORAL_TABLET | Freq: Every day | ORAL | Status: DC
  Administered 2011-12-07 – 2011-12-11 (×5): 10 mg via ORAL
  Filled 2011-12-07 (×5): qty 1

## 2011-12-07 MED ORDER — SODIUM CHLORIDE 0.9 % IJ SOLN: 3.0000 mL | INTRAMUSCULAR | Status: DC | PRN

## 2011-12-07 MED ORDER — CARVEDILOL 6.25 MG PO TABS
6.2500 mg | ORAL_TABLET | Freq: Two times a day (BID) | ORAL | Status: DC
  Administered 2011-12-07 – 2011-12-11 (×8): 6.25 mg via ORAL
  Filled 2011-12-07 (×10): qty 1

## 2011-12-07 NOTE — Progress Notes (Signed)
UR Completed.  Francisco [name] Jane [phone] 12/07/2011  

## 2011-12-07 NOTE — Progress Notes (Signed)
4 Days Post-Op Procedure(s) (LRB): CORONARY ARTERY BYPASS GRAFTING (CABG) (N/A) Subjective: Still sore O/w no c/o  Objective: Vital signs in last 24 hours: Temp:  [97.5 F (36.4 C)-98.7 F (37.1 C)] 97.5 F (36.4 C) (01/14 0749) Pulse Rate:  [71-90] 79  (01/14 0800) Cardiac Rhythm:  [-] Normal sinus rhythm (01/14 0800) Resp:  [13-21] 17  (01/14 0800) BP: (86-156)/(60-89) 156/87 mmHg (01/14 0800) SpO2:  [94 %-100 %] 99 % (01/14 0800) Weight:  [82.3 kg (181 lb 7 oz)] 82.3 kg (181 lb 7 oz) (01/14 0354)  Hemodynamic parameters for last 24 hours:    Intake/Output from previous day: 01/13 0701 - 01/14 0700 In: 736.3 [P.O.:600; I.V.:136.3] Out: 1200 [Urine:1200] Intake/Output this shift: Total I/O In: -  Out: 200 [Urine:200]  General appearance: alert and no distress Neurologic: intact Heart: regular rate and rhythm Lungs: diminished breath sounds base - left Extremities: no edema Wound: intact  Lab Results:  Basename 12/07/11 0550 12/06/11 0416  WBC 7.5 9.6  HGB 8.0* 7.9*  HCT 23.6* 23.1*  PLT 146* 125*   BMET:  Basename 12/07/11 0550 12/06/11 0416  NA 135 135  K 4.5 4.5  CL 108 107  CO2 16* 20  GLUCOSE 84 109*  BUN 58* 54*  CREATININE 3.38* 3.46*  CALCIUM 7.8* 7.9*    PT/INR: No results found for this basename: LABPROT,INR in the last 72 hours ABG    Component Value Date/Time   PHART 7.343* 12/03/2011 2017   HCO3 18.6* 12/03/2011 2017   TCO2 19 12/04/2011 1719   ACIDBASEDEF 6.0* 12/03/2011 2017   O2SAT 97.0 12/03/2011 2017   CBG (last 3)   Basename 12/07/11 0748 12/06/11 2121 12/06/11 1759  GLUCAP 80 112* 98    Assessment/Plan: S/P Procedure(s) (LRB): CORONARY ARTERY BYPASS GRAFTING (CABG) (N/A) Plan for transfer to step-down: see transfer orders CV- stable, no ACE-I secondary to renal issues Resp- pulmonary toilet Renal- creatinine stable, near baseline, follow Anemia acute on chronic, stable, follow Transfer to 2000   LOS: 16 days     Francisco [name] C 12/07/2011

## 2011-12-07 NOTE — Progress Notes (Signed)
Nursing Note: Patient transferred to room 2036 in wheelchair and on RA.  VS remain stable and patient tolerated well.  Report given to receiving RN.

## 2011-12-07 NOTE — Progress Notes (Signed)
[facility]  Subjective: No Complaints  Objective: Vital signs in last 24 hours: Temp:  [97.5 F (36.4 C)-98.7 F (37.1 C)] 97.5 F (36.4 C) (01/14 0749) Pulse Rate:  [71-90] 81  (01/14 0900) Resp:  [13-21] 18  (01/14 0900) BP: (86-156)/(60-92) 135/92 mmHg (01/14 0900) SpO2:  [94 %-100 %] 100 % (01/14 0900) Weight:  [82.3 kg (181 lb 7 oz)] 82.3 kg (181 lb 7 oz) (01/14 0354) Last BM Date: 11/30/11  Intake/Output from previous day: 01/13 0701 - 01/14 0700 In: 736.3 [P.O.:600; I.V.:136.3] Out: 1200 [Urine:1200] Intake/Output this shift: Total I/O In: 240 [P.O.:240] Out: 200 [Urine:200]  Medications Current Facility-Administered Medications  Medication Dose Route Frequency Provider Last Rate Last Dose  . 0.45 % sodium chloride infusion   Intravenous Continuous Kevis Giovanni, PA 20 mL/hr at 12/03/11 2000    . 0.9 %  sodium chloride infusion   Intravenous Continuous Toren Giovanni, PA 20 mL/hr at 12/06/11 1300    . 0.9 %  sodium chloride infusion  250 mL Intravenous Continuous Kerwin Giovanni, PA      . acetaminophen (TYLENOL) tablet 1,000 mg  1,000 mg Oral Q6H Wayne E Gold, PA   1,000 mg at 12/07/11 0540   Or  . acetaminophen (TYLENOL) solution 975 mg  975 mg Per Tube Q6H Adian Giovanni, PA   975 mg at 12/04/11 0015  . aspirin EC tablet 325 mg  325 mg Oral Daily Nakeem Giovanni, [place]   325 mg at 12/07/11 0932   Or  . aspirin chewable tablet 324 mg  324 mg Per Tube Daily Shoji Giovanni, PA      . bisacodyl (DULCOLAX) EC tablet 10 mg  10 mg Oral Daily Inman Giovanni, PA   10 mg at 12/07/11 0932   Or  . bisacodyl (DULCOLAX) suppository 10 mg  10 mg Rectal Daily Mesiah Giovanni, PA      . carvedilol (COREG) tablet 3.125 mg  3.125 mg Oral BID WC Melrose Nakayama, MD   3.125 mg at 12/07/11 0736  . ciprofloxacin (CIPRO) tablet 500 mg  500 mg Oral Q breakfast Melrose Nakayama, MD   500 mg at 12/07/11 0736  . docusate sodium (COLACE) capsule 200 mg  200 mg Oral Daily  Niall Giovanni, PA   200 mg at 12/07/11 0932  . ferrous sulfate tablet 325 mg  325 mg Oral Q breakfast Tharon Aquas Trigt III, MD   325 mg at 12/07/11 0736  . insulin aspart (novoLOG) injection 0-24 Units  0-24 Units Subcutaneous TID WC & HS Tharon Aquas Trigt III, MD      . insulin regular bolus via infusion 0-10 Units  0-10 Units Intravenous TID WC Wayne E Gold, PA      . magnesium sulfate IVPB 4 g 100 mL  4 g Intravenous Once [employer], PA      . metoprolol (LOPRESSOR) injection 2.5-5 mg  2.5-5 mg Intravenous Q2H PRN Kejon Giovanni, PA      . ondansetron (ZOFRAN) injection 4 mg  4 mg Intravenous Q6H PRN Wayne E Gold, PA      . pantoprazole (PROTONIX) EC tablet 40 mg  40 mg Oral Q1200 Wayne E Gold, PA   40 mg at 12/06/11 1135  . phenylephrine (NEO-SYNEPHRINE) 20,000 mcg in dextrose 5 % 250 mL infusion  0-100 mcg/min Intravenous Continuous Tadarrius Giovanni, PA   2 mcg/min at 12/05/11 0500  .  polyvinyl alcohol (LIQUIFILM TEARS) 1.4 % ophthalmic solution 1 drop  1 drop Both Eyes PRN Melrose Nakayama, MD      . rosuvastatin (CRESTOR) tablet 20 mg  20 mg Oral q1800 Jayln Giovanni, PA   20 mg at 12/06/11 1757  . sodium chloride 0.9 % injection 3 mL  3 mL Intravenous Q12H Izaak Giovanni, PA   3 mL at 12/06/11 2122  . sodium chloride 0.9 % injection 3 mL  3 mL Intravenous PRN Dodge Giovanni, PA      . DISCONTD: dexmedetomidine (PRECEDEX) 200 mcg in sodium chloride 0.9 % 50 mL infusion  0.1-0.7 mcg/kg/hr Intravenous Continuous Darral Giovanni, PA   0.1 mcg/kg/hr at 12/03/11 1724  . DISCONTD: DOPamine (INTROPIN) 800 mg in dextrose 5 % 250 mL infusion  2-10 mcg/kg/min Intravenous Titrated Kosei Giovanni, PA 2.7 mL/hr at 12/06/11 1000 2 mcg/kg/min at 12/06/11 1000  . DISCONTD: insulin aspart (novoLOG) injection 0-24 Units  0-24 Units Subcutaneous Q4H Ivin Poot III, MD   2 Units at 12/05/11 2302  . DISCONTD: lactated ringers infusion   Intravenous Continuous Delma Freeze, MD      . DISCONTD: lactated ringers  infusion   Intravenous Continuous Espn Giovanni, [place]      . DISCONTD: morphine 4 MG/ML injection 2-5 mg  2-5 mg Intravenous Q1H PRN Cleburne Giovanni, PA   2 mg at 12/04/11 1147  . DISCONTD: nitroGLYCERIN 0.2 mg/mL in dextrose 5 % infusion  0-100 mcg/min Intravenous Continuous Iseah Giovanni, PA   5 mcg/min at 12/04/11 1130  . DISCONTD: traMADol (ULTRAM) tablet 50 mg  50 mg Oral Q6H PRN Ivin Poot III, MD   50 mg at 12/06/11 0847    PE: General appearance: alert, cooperative and no distress Lungs: decreased BS greater on left. Heart: regular rate and rhythm, S1, S2 normal, no murmur, click, rub or gallop Extremities: 1+LEE Pulses: 2+ and symmetric radials  Lab Results:   Basename 12/07/11 0550 12/06/11 0416 12/05/11 0431  WBC 7.5 9.6 10.7*  HGB 8.0* 7.9* 8.0*  HCT 23.6* 23.1* 23.8*  PLT 146* 125* 115*   BMET  Basename 12/07/11 0550 12/06/11 0416 12/05/11 0431  NA 135 135 137  K 4.5 4.5 5.2*  CL 108 107 109  CO2 16* 20 18*  GLUCOSE 84 109* 114*  BUN 58* 54* 49*  CREATININE 3.38* 3.46* 3.61*  CALCIUM 7.8* 7.9* 7.8*    Studies/Results: PORTABLE CHEST - 1 VIEW  Comparison: 12/06/2011; 12/05/2011  Findings: Grossly unchanged enlarged cardiac silhouette and  mediastinal contours post median sternotomy and CABG. Interval  removal of right jugular approach central venous catheter. Grossly  unchanged small bilateral pleural effusions and bibasilar  heterogeneous opacities, left greater than right. No new focal  airspace opacities. Unchanged mild pulmonary venous congestion  without frank evidence of pulmonary edema. No pneumothorax.  Unchanged bones including bilateral glenohumeral joint degenerative  change.  IMPRESSION:  Grossly unchanged small bilateral effusions and basilar  heterogeneous opacities, left greater than right, atelectasis  versus infiltrate    Assessment/Plan  Principal Problem:  *S/P CABG x 5,  (LIMA-LAD; SEQSVG-RAMUS-OM; SVG-PD; SVG-DIAG) Active  Problems:  Ischemic cardiomyopathy - Acute  CAD (coronary artery disease), severe 3 vessel at cath 11/30/11  Acute respiratory failure requiring BiPap - Secondary to Acute Diastolic (+/- Systolic) CHF in setting of NSTEMI  Tachycardia  HTN (hypertension)  NSTEMI (non-ST elevated myocardial infarction),acute MI  Alternating LBBB and RBBB  Renal  failure, acute  Hypotension, transient  Dyslipidemia, LDL 180  Plan:    LOS: 16 days    HAGER,BRYAN W 12/07/2011 9:33 AM     Patient seen and examined. Agree with assessment and plan. Will increase coreg to 6.25 bid.  Add nitrates/hydralazine with LV dysfunction as BP allows; avoid ACE-I/ARB with Cr 3.3.  For tx to 2000 today.  Troy Sine, MD, [facility] 12/07/2011 9:36 AM

## 2011-12-07 NOTE — Progress Notes (Signed)
CARDIAC REHAB PHASE I   PRE:  Rate/Rhythm: 79 SR  BP:  Supine:   Sitting: 109/70  Standing:    SaO2: 96 RA  MODE:  Ambulation: 440 ft   POST:  Rate/Rhythem: 97  BP:  Supine:   Sitting: 128/79  Standing:    SaO2: 92 RA 1310- 1400 Assisted x1 and used walker to ambulate. Pt leans forward with walking. He tires easily and took several standing rest stops. Assisted to bathroom and then to recliner after walk with call light in reach.  Deon Pilling

## 2011-12-08 LAB — BASIC METABOLIC PANEL
BUN: 55 mg/dL — ABNORMAL HIGH (ref 6–23)
Chloride: 110 mEq/L (ref 96–112)
Creatinine, Ser: 3.04 mg/dL — ABNORMAL HIGH (ref 0.50–1.35)
GFR calc Af Amer: 23 mL/min — ABNORMAL LOW (ref >90)
GFR calc non Af Amer: 20 mL/min — ABNORMAL LOW (ref >90)
Glucose, Bld: 114 mg/dL — ABNORMAL HIGH (ref 70–99)
Potassium: 4.3 mEq/L (ref 3.5–5.1)

## 2011-12-08 LAB — GLUCOSE, CAPILLARY
Glucose-Capillary: 103 mg/dL — ABNORMAL HIGH (ref 70–99)
Glucose-Capillary: 113 mg/dL — ABNORMAL HIGH (ref 70–99)

## 2011-12-08 LAB — CBC
HCT: 22.6 % — ABNORMAL LOW (ref 39.0–52.0)
Hemoglobin: 7.5 g/dL — ABNORMAL LOW (ref 13.0–17.0)
MCHC: 33.2 g/dL (ref 30.0–36.0)
MCV: 83.7 fL (ref 78.0–100.0)
RDW: 15.8 % — ABNORMAL HIGH (ref 11.5–15.5)

## 2011-12-08 MED ORDER — CARVEDILOL 6.25 MG PO TABS: 6.2500 mg | ORAL_TABLET | Freq: Two times a day (BID) | ORAL | Status: DC

## 2011-12-08 MED ORDER — FERROUS SULFATE 325 (65 FE) MG PO TABS: 325.0000 mg | ORAL_TABLET | Freq: Every day | ORAL | Status: DC

## 2011-12-08 MED ORDER — ASPIRIN 325 MG PO TBEC: 325.0000 mg | DELAYED_RELEASE_TABLET | Freq: Every day | ORAL | Status: AC

## 2011-12-08 MED ORDER — ROSUVASTATIN CALCIUM 20 MG PO TABS: 20.0000 mg | ORAL_TABLET | Freq: Every day | ORAL | Status: DC

## 2011-12-08 MED ORDER — FUROSEMIDE 40 MG PO TABS: 40.0000 mg | ORAL_TABLET | Freq: Every day | ORAL | Status: DC

## 2011-12-08 MED ORDER — OXYCODONE HCL 5 MG PO TABS: 5.0000 mg | ORAL_TABLET | ORAL | Status: AC | PRN

## 2011-12-08 MED FILL — Mannitol IV Soln 20%: INTRAVENOUS | Qty: 500 | Status: AC

## 2011-12-08 MED FILL — Sodium Bicarbonate IV Soln 8.4%: INTRAVENOUS | Qty: 100 | Status: AC

## 2011-12-08 MED FILL — Lidocaine HCl IV Inj 20 MG/ML: INTRAVENOUS | Qty: 5 | Status: AC

## 2011-12-08 NOTE — Discharge Summary (Signed)
Physician Discharge Summary  Patient ID: Francisco [name] MRN: [id number] DOB/AGE: 1946/08/28 66 y.o.  Admit date: 11/21/2011 Discharge date: 12/09/2011  Admission Diagnoses: 1.NSTEMI 2.Multivessel CAD  3.Ischemic Cardiomyopathy 4.CHF 5.Acute Respiratory Failure (requiring BiPap) 6.Acute renal failure 7.Dyslipidemia 8.Hypertension 9.Acute pulmonary edema  Discharge Diagnoses:  1.NSTEMI 2.Multivessel CAD  3.Ischemic Cardiomyopathy 4.CHF 5.Acute Respiratory Failure (requiring BiPap) 6.Acute renal failure 7.Dyslipidemia 8.Hypertension 9.Acute pulmonary edema 10.ABL anemia 11.Thrombocytopenia (resolved prior to discharge)   Procedure (s):  1.Cardiac Catheterization dony by Dr. Debara Pickett on 11/30/2011: Coronary Angiographic Data:  Left Main: No angiographic stenosis.  Left Anterior Descending (LAD): Calcified LAD with a 95% proximal stenosis, just distal to a large D1 branch. There is a discrete 80% mid-LAD stenosis at the D2 branch.  1st diagonal (D1): Large caliber branch which supplies a good portion of the lateral wall. There appears to be a severe 80% stenosis of the proximal vessel with homocollaterals from the LCX system noted.  2nd diagonal (D2): Small vessel, appears to be co-located with an 80% mid LAD stenosis.  3rd diagonal (D3): Small vessel Circumflex (LCx): There is a tubular stenosis of the mid to distal LCx, after the take-off of a large OM1 branch.  1st obtuse marginal: Large branch with moderate to severe ostial and proximal lesions.  Right Coronary Artery: Calcified vessel with moderate proximal stenosis and a 95% distal stenosis just prior to the bifurcation of the PLB and PDA branches.  right ventricle branch of right coronary artery: Large vessel with severe ostial stenosis.  posterior descending artery: Decent caliber vessel.  posterior lateral branch: May have mid vessel stenosis. Impression:  (1.) Multivessel coronary artery disease. LVEF by nuclear stress test  is 24%.  (2.) Acute or acute on chronic kidney disease.  (3.) Tortuous aorta which was not evaluated due to dye constraints.  2.Median sternotomy, extracorporeal circulation, coronary  bypass grafting x5 (left internal mammary artery to left anterior  descending, saphenous vein graft to 1st diagonal, sequential saphenous  vein graft to ramus intermedius and obtuse marginal 1, saphenous vein  graft to posterior descending) by Dr. Roxan Hockey on 12/03/2011.    History of Presenting Illness: This is a 66 year old African American male with long-standing history of hypertension who presented to the emergency room today secondary to chest pain and acute shortness of breath. He had done fairly well until the evening of 11/20/2011 when he developed chest pain. During the night, became short of breath he had nausea and emesis, clammy to touch, extremely short of breath. In the emergency room he is in respiratory failure and was placed on BiPAP and 60 mg of IV Lasix was given as well as nebulizer treatments with significant interval improvement in symptoms. Patient has not been taking his HTN medications due to financial reasons. Cardiology admitted the patient and PCCM was consulted regarding BiPAP and respiratory management. His initial Troponin I was 11.27. His  CK MB went as high as 499.5 and his total CK went as high as 4005. He ruled in for a NSTEMI. He was also in CHF with BNP > 8000. A nephrololgy consult was obtained with Dr. Hassell Done, as his creatinine was above 3. He was stabilized medically as much as possible. He then underwent cardiac catheterization by Dr. Debara Pickett on 11/30/2011. Results indicated three-vessel coronary artery disease.A cardiothoracic consultation was then obtained with Dr. Roxan Hockey for the consideration of coronary artery bypass grafting surgery.  Preoperative carotid artery duplex carotid ultrasound showed no significant carotid artery stenosis bilaterally. ABIs were found to  be 0.75  on the right and 1.02 on the left. Potential risks, complications, and benefits of the surgery discussed with the patient and he agreed to proceed. He underwent CABG x5 on 12/03/2011.     Brief Hospital Course:  He remained afebrile and hemodynamically stable. He was extubated without difficulty earlier the evening of surgery. He was on dopamine postoperatively and this was weaned off. Swan-Ganz, A-line, chest tubes, and Foley were all removed fairly early in his hospital course. He was found have acute blood loss anemia. His H&H was low as 5.6 and 16.8. He received 2 units of packed red blood cells. Followup H&H is an 8 and 23.8 respectively. He did have some confusion as well, which was attributed to narcotics. His creatinine postoperatively remained around 3.5. He was maintaining sinus rhythm and he had been started on Coreg. He was felt surgically stable for transfer from the intensive care to the telemetry floor for further convalescence on 12/07/2011. He continued to progress with cardiac rehabilitation. He was also found to have thrombocytopenia postoperatively. His platelet count went as low as 93,000 however his last platelet count was found to be within normal limits at 176,000. He is volume overloaded and has been diuresing  well. Last creatinine was found to be down to 3.04.  He has already been tolerating a diet and has had a bowel movement. Provided he remains afebrile, hemodynamically stable, and pending morning round evaluation, he'll be surgically stable for discharge on 12/09/2011.Epicardial pacing wires and chest tube sutures will be removed prior his discharge.  Filed Vitals:   12/08/11 1330  BP: 106/68  Pulse: 80  Temp: 98.8 F (37.1 C)  Resp: 18     Latest Vital Signs: Blood pressure 106/68, pulse 80, temperature 98.8 F (37.1 C), temperature source Oral, resp. rate 18, height 5\' 5"  (1.651 m), weight 192 lb 0.3 oz (87.1 kg), SpO2 96.00%.  Physical Exam: Heart: RRR  Lungs:  clear  Wound: clean and dry  Extremities: + LE edema   Discharge Condition:Stable  Recent laboratory studies:  Lab Results  Component Value Date   WBC 7.2 12/08/2011   HGB 7.5* 12/08/2011   HCT 22.6* 12/08/2011   MCV 83.7 12/08/2011   PLT 176 12/08/2011   Lab Results  Component Value Date   NA 139 12/08/2011   K 4.3 12/08/2011   CL 110 12/08/2011   CO2 19 12/08/2011   CREATININE 3.04* 12/08/2011   GLUCOSE 114* 12/08/2011      Diagnostic Studies: Dg Chest 2 View  12/07/2011  *RADIOLOGY REPORT*  Clinical Data: Post open heart surgery  PORTABLE CHEST - 1 VIEW  Comparison: 12/06/2011; 12/05/2011  Findings: Grossly unchanged enlarged cardiac silhouette and mediastinal contours post median sternotomy and CABG.  Interval removal of right jugular approach central venous catheter.  Grossly unchanged small bilateral pleural effusions and bibasilar heterogeneous opacities, left greater than right.  No new focal airspace opacities. Unchanged mild pulmonary venous congestion without frank evidence of pulmonary edema.  No pneumothorax. Unchanged bones including bilateral glenohumeral joint degenerative change.  IMPRESSION: Grossly unchanged small bilateral effusions and basilar heterogeneous opacities, left greater than right, atelectasis versus infiltrate.  Original Report Authenticated By: Rachel Moulds, M.D.    Discharge Orders    Future Appointments: Provider: Department: Dept Phone: Center:   01/12/2012 12:30 PM Melrose Nakayama, MD [facility] [phone] [facility]      Discharge Medications: Current Discharge Medication List    START taking these medications  Details  aspirin EC 325 MG EC tablet Take 1 tablet (325 mg total) by mouth daily. Qty: 30 tablet    carvedilol (COREG) 6.25 MG tablet Take 1 tablet (6.25 mg total) by mouth 2 (two) times daily with a meal. Qty: 60 tablet, Refills: 1    ferrous sulfate 325 (65 FE) MG tablet Take 1 tablet (325 mg total) by mouth daily with  breakfast. For one month then stop.    furosemide (LASIX) 40 MG tablet Take 1 tablet (40 mg total) by mouth daily. Qty: 30 tablet, Refills: 0    oxyCODONE (OXY IR/ROXICODONE) 5 MG immediate release tablet Take 1-2 tablets (5-10 mg total) by mouth every 4 (four) hours as needed for pain. Qty: 45 tablet, Refills: 0    rosuvastatin (CRESTOR) 20 MG tablet Take 1 tablet (20 mg total) by mouth daily at 6 PM. Qty: 30 tablet, Refills: 1      CONTINUE these medications which have NOT CHANGED   Details  amLODipine (NORVASC) 10 MG tablet Take 10 mg by mouth daily.      cloNIDine (CATAPRES) 0.2 MG tablet Take 0.2 mg by mouth 2 (two) times daily.        STOP taking these medications     aspirin 81 MG tablet      atenolol (TENORMIN) 100 MG tablet      lisinopril-hydrochlorothiazide (PRINZIDE,ZESTORETIC) 20-12.5 MG per tablet      pravastatin (PRAVACHOL) 20 MG tablet         Follow Up Appointments: Follow-up Information    Follow up with Shelva Majestic A, MD. (Call for follow up appointment for 2 weeks)    Contact information:   [address] [place] [phone]       Follow up with Sherrie Mustache, MD. (Call for an appointment regarding furher surveillance of HGA1C 6.2)    Contact information:   [place] [phone]       Follow up with Melrose Nakayama, MD. (PA/LAT CXR to be taken on 01/12/2012 at 11:45 am;Appointment with  Dr. Roxan Hockey is on 01/12/2012 at 12:30 pm)    Contact information:   [place] [phone]       Follow up with Geoffry Paradise, MD. (Call for a follow up appointment regarding kidney function)    Contact information:   [place] [phone]          Signed: Lars Pinks MPA-C 12/08/2011, 3:13 PM

## 2011-12-08 NOTE — Progress Notes (Signed)
[facility]  Subjective: The patient reports having a good BM this AM.  Ambulating in the hall with cardiac rehab.  Objective: Vital signs in last 24 hours: Temp:  [97.5 F (36.4 C)-97.9 F (36.6 C)] 97.5 F (36.4 C) (01/15 0421) Pulse Rate:  [75-84] 75  (01/15 0421) Resp:  [18-19] 19  (01/15 0421) BP: (109-141)/(68-82) 117/71 mmHg (01/15 0421) SpO2:  [0 %-98 %] 98 % (01/15 0421) FiO2 (%):  [92 %] 92 % (01/14 1029) Weight:  [87.1 kg (192 lb 0.3 oz)] 87.1 kg (192 lb 0.3 oz) (01/15 0421) Last BM Date: 12/07/11  Intake/Output from previous day: 01/14 0701 - 01/15 0700 In: 1080 [P.O.:1080] Out: 400 [Urine:400] Intake/Output this shift:    Medications Current Facility-Administered Medications  Medication Dose Route Frequency Provider Last Rate Last Dose  . 0.9 %  sodium chloride infusion  250 mL Intravenous PRN Melrose Nakayama, MD      . acetaminophen (TYLENOL) tablet 1,000 mg  1,000 mg Oral Q6H Naol Giovanni, PA   1,000 mg at 12/08/11 [id number]   Or  . acetaminophen (TYLENOL) solution 975 mg  975 mg Per Tube Q6H Enoch Giovanni, PA   975 mg at 12/04/11 0015  . amLODipine (NORVASC) tablet 10 mg  10 mg Oral Daily Melrose Nakayama, MD   10 mg at 12/07/11 1142  . aspirin EC tablet 325 mg  325 mg Oral Daily Samie Giovanni, [place]   325 mg at 12/07/11 0932   Or  . aspirin chewable tablet 324 mg  324 mg Per Tube Daily Daylin Giovanni, PA      . bisacodyl (DULCOLAX) EC tablet 10 mg  10 mg Oral Daily Hawken Giovanni, PA   10 mg at 12/07/11 0932   Or  . bisacodyl (DULCOLAX) suppository 10 mg  10 mg Rectal Daily Lebron Giovanni, PA      . carvedilol (COREG) tablet 6.25 mg  6.25 mg Oral BID WC Troy Sine, MD   6.25 mg at 12/08/11 0809  . ciprofloxacin (CIPRO) tablet 500 mg  500 mg Oral Q breakfast Melrose Nakayama, MD   500 mg at 12/08/11 [id number]  . cloNIDine (CATAPRES) tablet 0.2 mg  0.2 mg Oral BID Melrose Nakayama, MD   0.2 mg at 12/07/11 2245  . docusate  sodium (COLACE) capsule 200 mg  200 mg Oral Daily Fenix Giovanni, PA   200 mg at 12/07/11 0932  . ferrous sulfate tablet 325 mg  325 mg Oral Q breakfast Tharon Aquas Trigt III, MD   325 mg at 12/08/11 [id number]  . furosemide (LASIX) tablet 40 mg  40 mg Oral Daily Melrose Nakayama, MD   40 mg at 12/07/11 1143  . insulin aspart (novoLOG) injection 0-24 Units  0-24 Units Subcutaneous TID WC & HS Ivin Poot III, MD   4 Units at 12/07/11 2246  . moving right along book   Does not apply Once Melrose Nakayama, MD      . ondansetron [facility]) injection 4 mg  4 mg Intravenous Q6H PRN Saurabh Giovanni, PA      . oxyCODONE (Oxy IR/ROXICODONE) immediate release tablet 5-10 mg  5-10 mg Oral Q3H PRN Melrose Nakayama, MD      . pantoprazole (PROTONIX) EC tablet 40 mg  40 mg Oral Q1200 Makoto Giovanni, PA   40 mg at 12/07/11 1144  . polyvinyl alcohol (LIQUIFILM TEARS) 1.4 %  ophthalmic solution 1 drop  1 drop Both Eyes PRN Melrose Nakayama, MD      . povidone-iodine (BETADINE) 10 % external solution 1 application  1 application Topical BID Melrose Nakayama, MD   1 application at [id number]  . rosuvastatin (CRESTOR) tablet 20 mg  20 mg Oral q1800 Izayiah Giovanni, PA   20 mg at 12/07/11 1714  . sodium chloride 0.9 % injection 3 mL  3 mL Intravenous Q12H Melrose Nakayama, MD   3 mL at 12/07/11 2200  . sodium chloride 0.9 % injection 3 mL  3 mL Intravenous PRN Melrose Nakayama, MD      . zolpidem [facility]) tablet 5 mg  5 mg Oral QHS PRN Melrose Nakayama, MD      . DISCONTD: 0.45 % sodium chloride infusion   Intravenous Continuous Ashden Giovanni, PA 20 mL/hr at 12/03/11 2000    . DISCONTD: 0.9 %  sodium chloride infusion   Intravenous Continuous Lj Giovanni, PA 20 mL/hr at 12/06/11 1300    . DISCONTD: 0.9 %  sodium chloride infusion  250 mL Intravenous Continuous Trennon Giovanni, PA      . DISCONTD: insulin regular bolus via infusion 0-10 Units  0-10 Units Intravenous TID WC Christipher Giovanni, PA      .  DISCONTD: magnesium sulfate IVPB 4 g 100 mL  4 g Intravenous Once Izell Giovanni, [place]      . DISCONTD: metoprolol (LOPRESSOR) injection 2.5-5 mg  2.5-5 mg Intravenous Q2H PRN Corneluis Giovanni, PA      . DISCONTD: phenylephrine (NEO-SYNEPHRINE) 20,000 mcg in dextrose 5 % 250 mL infusion  0-100 mcg/min Intravenous Continuous Dyron Giovanni, PA   2 mcg/min at 12/05/11 0500  . DISCONTD: sodium chloride 0.9 % injection 3 mL  3 mL Intravenous Q12H Jaylan Giovanni, PA   3 mL at 12/07/11 0933  . DISCONTD: sodium chloride 0.9 % injection 3 mL  3 mL Intravenous PRN Abid Giovanni, PA        PE: General appearance: alert, cooperative and mild distress Lungs: Decrease BS bilaterally Heart: regular rate and rhythm, S1, S2 normal, no murmur, click, rub or gallop Extremities: 1-2+ LEE greater on the left Pulses: 2+ and symmetric Radials  Lab Results:   Basename 12/08/11 0625 12/07/11 0550 12/06/11 0416  WBC 7.2 7.5 9.6  HGB 7.5* 8.0* 7.9*  HCT 22.6* 23.6* 23.1*  PLT 176 146* 125*   BMET  Basename 12/08/11 0625 12/07/11 0550 12/06/11 0416  NA 139 135 135  K 4.3 4.5 4.5  CL 110 108 107  CO2 19 16* 20  GLUCOSE 114* 84 109*  BUN 55* 58* 54*  CREATININE 3.04* 3.38* 3.46*  CALCIUM 7.4* 7.8* 7.9*     Assessment/Plan  Principal Problem:  *S/P CABG x 5,  (LIMA-LAD; SEQSVG-RAMUS-OM; SVG-PD; SVG-DIAG) Active Problems:  Ischemic cardiomyopathy - Acute  CAD (coronary artery disease), severe 3 vessel at cath 11/30/11  Acute respiratory failure requiring BiPap - Secondary to Acute Diastolic (+/- Systolic) CHF in setting of NSTEMI  Tachycardia  HTN (hypertension)  NSTEMI (non-ST elevated myocardial infarction),acute MI  Alternating LBBB and RBBB  Renal failure, acute  Hypotension, transient  Dyslipidemia, LDL 180  Plan: Pt ambulating without distress.  Hgb stable.  Net out 418ml yesterday. Pt looks good.  Coreg increased yesterday. BP/HR stable and controlled.   LOS: 17 days    HAGER,BRYAN  W 12/08/2011 9:43 AM  I  have seen and examined the patient along with Brett Canales, PA.  I have reviewed the chart, notes and new data.  I agree with PA's note.  Key new complaints: mild sternal pain; no dyspnea Key examination changes: no arrhythmia Key new findings / data: creatinine improving; not on ACEi/ARB or aldosterone inhibitors due to acute on chronic renal failure  PLAN: Recheck LVEF w limited echo. If EF still<35% dc home w LifeVest and reevaluate for AICD in 90 days.  Sanda Klein, MD, [place] [phone] 12/08/2011, 11:57 AM

## 2011-12-08 NOTE — Progress Notes (Signed)
   CARE MANAGEMENT NOTE 12/08/2011  Patient:  Francisco [name], Francisco [name]   Account Number:  [id number]  Date Initiated:  11/26/2011  Documentation initiated by:  GRAVES-BIGELOW,BRENDA  Subjective/Objective Assessment:   Pt in with CP. Hx htn. Creatinine has finally declined. Plan to hold off on heart cath until creatinine has improved.     Action/Plan:   Pt is from home with wife Posey Pronto [phone]. Pt is plan for CABG either Wed or Thursday per wife. Pt may need HH services at d/c. Wife stated pt was ambulatory before admission.   Anticipated DC Date:  12/11/2011   Anticipated DC Plan:  [place]  CM consult      Choice offered to / List presented to:     DME arranged  [facility]ass[facility]elle      DME agency  [facility].        Status of service:  In process, will continue to follow Medicare Important Message given?   (If response is "NO", the following Medicare IM given date fields will be blank) Date Medicare IM given:   Date Additional Medicare IM given:    Discharge Disposition:  HOME/SELF CARE  Per UR Regulation:  Reviewed for med. necessity/level of care/duration of stay  Comments:  12/08/11 Danniela Mcbrearty,RN,BSN 1400 MET WITH PT AND FAMILY TO DISCUSS DC PLANS.  WIFE TO PROVIDE 24 HR CARE AT DC.  REQUESTS RW FOR HOME.  REFERRAL TO [facility] FOR DME NEEDS. Phone [phone]   12-07-11 1:30pm Luz Lex, RNBSN [phone] UR completed. - tx out of ICU to acute tele unit today.  12-04-11 11:33am Luz Lex, [place] [id number] UR Completed.  12-01-11 [address], [place] [phone] CM did speak to family and pt may need hh services at d/c. CM will continue to monitor for d/c disposition.   11-30-11 4pm [place] [id number] UR Completed - post cardiac cath today.  11-26-11 1646 Jacqlyn Krauss, RN,BSN [phone] CM will continue to moniotr for d/c disposition.

## 2011-12-08 NOTE — Progress Notes (Signed)
CARDIAC REHAB PHASE I   PRE:  Rate/Rhythm: 52 SR PVC's  BP:  Supine:   Sitting: 110/65  Standing:    SaO2: 97 RA  MODE:  Ambulation: 550 ft   POST:  Rate/Rhythem: 81  BP:  Supine:   Sitting: 119/76  Standing:    SaO2: 85-88RA on return, with rest and recheck 98% RA Assisted X 1 and used walker to ambulate. Gait steady with walker, not leaning as much today with walking more upright without having to remind him. RA sat down after walk seemed SOB, with rest returned to 98%. Encouraged use of IS.  Francisco [name]

## 2011-12-08 NOTE — Progress Notes (Signed)
Patient ambulated with family 350 ft with rolling walker. Patient tolerated well.  VSS.  Returned to chair.  Will continue to monitor.

## 2011-12-08 NOTE — Progress Notes (Addendum)
                    [address]            [place] [address]          [phone]     5 Days Post-Op Procedure(s) (LRB): CORONARY ARTERY BYPASS GRAFTING (CABG) (N/A)  Subjective: Feels well, no complaints.  +BM this morning.  Objective: Vital signs in last 24 hours: Patient Vitals for the past 24 hrs:  BP Temp Temp src Pulse Resp SpO2 Weight  12/08/11 0421 117/71 mmHg 97.5 F (36.4 C) Oral 75  19  98 % 87.1 kg (192 lb 0.3 oz)  12/07/11 2158 112/68 mmHg 97.9 F (36.6 C) Oral 77  18  95 % -  12/07/11 1330 109/70 mmHg 97.8 F (36.6 C) Oral 78  18  93 % -  12/07/11 1029 - - - - - 0 % -   Current Weight  12/08/11 87.1 kg (192 lb 0.3 oz)   Pre-op weight= 73 kg  Intake/Output from previous day: 01/14 0701 - 01/15 0700 In: 1080 [P.O.:1080] Out: 400 [Urine:400]    PHYSICAL EXAM:  Heart: RRR Lungs: clear Wound: clean and dry Extremities: + LE edema  Lab Results: CBC: Basename 12/08/11 0625 12/07/11 0550  WBC 7.2 7.5  HGB 7.5* 8.0*  HCT 22.6* 23.6*  PLT 176 146*   BMET:  Basename 12/08/11 0625 12/07/11 0550  NA 139 135  K 4.3 4.5  CL 110 108  CO2 19 16*  GLUCOSE 114* 84  BUN 55* 58*  CREATININE 3.04* 3.38*  CALCIUM 7.4* 7.8*    PT/INR: No results found for this basename: LABPROT,INR in the last 72 hours   Assessment/Plan: S/P Procedure(s) (LRB): CORONARY ARTERY BYPASS GRAFTING (CABG) (N/A) CV- stable, SR. Vol overload-continue diuresis A/ChRI- Cr stable, trending down.  Continue to monitor. ABL anemia- H/H down some today, pt asymptomatic.  Continue Fe and watch. CRPI, pulm toilet.  Possibly home in next few days?   LOS: 17 days    COLLINS,GINA H 12/08/2011   I have seen and examined the patient and agree with the above assessment. Ambulating well Plan d/c tomorrow if he continues to progress

## 2011-12-08 NOTE — Progress Notes (Deleted)
Unable to get ortho vitals at this time due to pain and patient asked [place] to come back.  Will continue to monitor patient and try again later.

## 2011-12-09 LAB — BASIC METABOLIC PANEL
Calcium: 8 mg/dL — ABNORMAL LOW (ref 8.4–10.5)
GFR calc Af Amer: 27 mL/min — ABNORMAL LOW (ref >90)
GFR calc non Af Amer: 23 mL/min — ABNORMAL LOW (ref >90)
Potassium: 4.4 mEq/L (ref 3.5–5.1)
Sodium: 138 mEq/L (ref 135–145)

## 2011-12-09 LAB — CBC
Hemoglobin: 8.4 g/dL — ABNORMAL LOW (ref 13.0–17.0)
Platelets: 204 10*3/uL (ref 150–400)
RBC: 2.97 MIL/uL — ABNORMAL LOW (ref 4.22–5.81)
WBC: 9.3 10*3/uL (ref 4.0–10.5)

## 2011-12-09 LAB — GLUCOSE, CAPILLARY
Glucose-Capillary: 94 mg/dL (ref 70–99)
Glucose-Capillary: 106 mg/dL — ABNORMAL HIGH (ref 70–99)
Glucose-Capillary: 104 mg/dL — ABNORMAL HIGH (ref 70–99)

## 2011-12-09 NOTE — Progress Notes (Signed)
Subjective:  No SOB, up without problems  Objective:  Vital Signs in the last 24 hours: Temp:  [97.5 F (36.4 C)-98.8 F (37.1 C)] 97.5 F (36.4 C) (01/16 0439) Pulse Rate:  [73-80] 80  (01/16 0439) Resp:  [18] 18  (01/16 0439) BP: (106-129)/(68-82) 120/68 mmHg (01/16 0439) SpO2:  [95 %-96 %] 95 % (01/16 0439) Weight:  [80.9 kg (178 lb 5.6 oz)] 80.9 kg (178 lb 5.6 oz) (01/16 0517)  Intake/Output from previous day:  Intake/Output Summary (Last 24 hours) at 12/09/11 0953 Last data filed at 12/09/11 0800  Gross per 24 hour  Intake    720 ml  Output    451 ml  Net    269 ml      . acetaminophen  1,000 mg Oral Q6H   Or  . acetaminophen (TYLENOL) oral liquid 160 mg/5 mL  975 mg Per Tube Q6H  . amLODipine  10 mg Oral Daily  . aspirin EC  325 mg Oral Daily   Or  . aspirin  324 mg Per Tube Daily  . bisacodyl  10 mg Oral Daily   Or  . bisacodyl  10 mg Rectal Daily  . carvedilol  6.25 mg Oral BID WC  . cloNIDine  0.2 mg Oral BID  . docusate sodium  200 mg Oral Daily  . ferrous sulfate  325 mg Oral Q breakfast  . furosemide  40 mg Oral Daily  . insulin aspart  0-24 Units Subcutaneous TID WC & HS  . pantoprazole  40 mg Oral Q1200  . povidone-iodine  1 application Topical BID  . rosuvastatin  20 mg Oral q1800  . sodium chloride  3 mL Intravenous Q12H   Physical Exam: General appearance: alert, cooperative and no distress Lungs: clear to auscultation bilaterally; decreased BS at bases Heart: regular rate and rhythm BS+ 1+ tense LE edema.  Rate: 80  Rhythm: normal sinus rhythm  Lab Results:  Basename 12/09/11 0605 12/08/11 0625  WBC 9.3 7.2  HGB 8.4* 7.5*  PLT 204 176    Basename 12/09/11 0605 12/08/11 0625  NA 138 139  K 4.4 4.3  CL 110 110  CO2 19 19  GLUCOSE 108* 114*  BUN 49* 55*  CREATININE 2.69* 3.04*   No results found for this basename: TROPONINI:2,CK,MB:2 in the last 72 hours Hepatic Function Panel No results found for this basename:  PROT,ALBUMIN,AST,ALT,ALKPHOS,BILITOT,BILIDIR,IBILI in the last 72 hours No results found for this basename: CHOL in the last 72 hours No results found for this basename: INR in the last 72 hours  Imaging: No results found.  Cardiac Studies:  Assessment/Plan:   Principal Problem:  *S/P CABG x 5,  (LIMA-LAD; SEQSVG-RAMUS-OM; SVG-PD; SVG-DIAG  Active Problems:  Acute respiratory failure requiring BiPap - Secondary to Acute Diastolic (+/- Systolic) CHF in setting of NSTEMI  NSTEMI (non-ST elevated myocardial infarction),acute MI  Alternating LBBB and RBBB  Renal failure, acute, imrpoving  Ischemic cardiomyopathy - Acute  Hypotension, transient  CAD (coronary artery disease), severe 3 vessel at cath 11/30/11  Tachycardia  HTN (hypertension)  Dyslipidemia, LDL 180  Plan-Per CVTS, we will arrange follow up.    Kerin Ransom PA-C 12/09/2011, 9:53 AM     Patient seen and examined. Agree with assessment and plan. Continue diuresesis. For echo today per Dr. Loletha Grayer to reasses LV to asses for lifevest potential.   Troy Sine, MD, [facility] 12/09/2011 10:08 AM

## 2011-12-09 NOTE — Progress Notes (Signed)
[id number] Cardiac Rehab Completed discharge education with pt and family. He agrees to [facility]. CRP in [place], will send referral.

## 2011-12-09 NOTE — Progress Notes (Addendum)
                    [address]            [place] [address]          [phone]     6 Days Post-Op Procedure(s) (LRB): CORONARY ARTERY BYPASS GRAFTING (CABG) (N/A)  Subjective: Feels well.  No complaints.   Objective: Vital signs in last 24 hours: Patient Vitals for the past 24 hrs:  BP Temp Temp src Pulse Resp SpO2 Weight  12/09/11 0517 - - - - - - 80.9 kg (178 lb 5.6 oz)  12/09/11 0439 120/68 mmHg 97.5 F (36.4 C) Oral 80  18  95 % -  12/08/11 2107 129/82 mmHg 97.6 F (36.4 C) Oral 73  18  96 % -  12/08/11 1330 106/68 mmHg 98.8 F (37.1 C) Oral 80  18  96 % -   Current Weight  12/09/11 80.9 kg (178 lb 5.6 oz)     Intake/Output from previous day: 01/15 0701 - 01/16 0700 In: 720 [P.O.:720] Out: 451 [Urine:450; Stool:1]    PHYSICAL EXAM:  Heart: RRR Lungs: clear Wound: clean and dry Extremities: + LE edema  Lab Results: CBC: Basename 12/09/11 0605 12/08/11 0625  WBC 9.3 7.2  HGB 8.4* 7.5*  HCT 25.1* 22.6*  PLT 204 176   BMET:  Basename 12/09/11 0605 12/08/11 0625  NA 138 139  K 4.4 4.3  CL 110 110  CO2 19 19  GLUCOSE 108* 114*  BUN 49* 55*  CREATININE 2.69* 3.04*  CALCIUM 8.0* 7.4*    PT/INR: No results found for this basename: LABPROT,INR in the last 72 hours   Assessment/Plan: S/P Procedure(s) (LRB): CORONARY ARTERY BYPASS GRAFTING (CABG) (N/A) CV- stable Anemia- improving ARI- Cr trending back down [place] to d/c Home today-instructions reviewed with patient.  Cardiology plans echo, +/- LifeVest prior to discharge.   LOS: 18 days    COLLINS,GINA H 12/09/2011   Agree with above, ready for d/c once life vest issue decided

## 2011-12-09 NOTE — Progress Notes (Signed)
D/C'd patients EPW and CTS per MD order and hospital policy.  Patient tolerated well.  All ends intact.  No bleeding.  Will continue to monitor. Bedrest for one hour. Vitals stable.

## 2011-12-09 NOTE — Progress Notes (Signed)
Patient ambulated several times today with rolling walker, independently.  Tolerated well each walk.  Returned to bed.  VSS.  Will continue to monitor.

## 2011-12-09 NOTE — Progress Notes (Signed)
Patient discussed at the Long Length of Stay Francisco [name] Weeks 12/09/2011  

## 2011-12-10 LAB — GLUCOSE, CAPILLARY
Glucose-Capillary: 105 mg/dL — ABNORMAL HIGH (ref 70–99)
Glucose-Capillary: 121 mg/dL — ABNORMAL HIGH (ref 70–99)

## 2011-12-10 MED ORDER — FUROSEMIDE 40 MG PO TABS: 40.0000 mg | ORAL_TABLET | Freq: Two times a day (BID) | ORAL | Status: DC

## 2011-12-10 MED ORDER — FUROSEMIDE 40 MG PO TABS
40.0000 mg | ORAL_TABLET | Freq: Two times a day (BID) | ORAL | Status: DC
  Administered 2011-12-10 – 2011-12-11 (×2): 40 mg via ORAL
  Filled 2011-12-10 (×4): qty 1

## 2011-12-10 NOTE — Progress Notes (Addendum)
[address]            [place] [address]          [phone]     7 Days Post-Op  Procedure(s) (LRB): CORONARY ARTERY BYPASS GRAFTING (CABG) (N/A) Subjective: Feels well  Objective  Telemetry NSR,PVC's  Temp:  [97.1 F (36.2 C)-98 F (36.7 C)] 97.1 F (36.2 C) (01/17 0458) Pulse Rate:  [79-82] 79  (01/17 0458) Resp:  [18] 18  (01/17 0458) BP: (115-126)/(60-79) 125/74 mmHg (01/17 0458) SpO2:  [95 %-97 %] 95 % (01/17 0458) Weight:  [178 lb (80.74 kg)] 178 lb (80.74 kg) (01/17 0458)   Intake/Output Summary (Last 24 hours) at 12/10/11 0754 Last data filed at 12/09/11 1900  Gross per 24 hour  Intake    600 ml  Output    300 ml  Net    300 ml       General appearance: alert, cooperative and no distress Heart: regular rate and rhythm and S1, S2 normal Lungs: mildly diminished L base Abdomen: soft, non-tender; bowel sounds normal; no masses,  no organomegaly Extremities: + LE edema Wound: incisions healing well  Lab Results:  Basename 12/09/11 0605 12/08/11 0625  NA 138 139  K 4.4 4.3  CL 110 110  CO2 19 19  GLUCOSE 108* 114*  BUN 49* 55*  CREATININE 2.69* 3.04*  CALCIUM 8.0* 7.4*  MG -- --  PHOS -- --   No results found for this basename: AST:2,ALT:2,ALKPHOS:2,BILITOT:2,PROT:2,ALBUMIN:2 in the last 72 hours No results found for this basename: LIPASE:2,AMYLASE:2 in the last 72 hours  Basename 12/09/11 0605 12/08/11 0625  WBC 9.3 7.2  NEUTROABS -- --  HGB 8.4* 7.5*  HCT 25.1* 22.6*  MCV 84.5 83.7  PLT 204 176   No results found for this basename: CKTOTAL:4,CKMB:4,TROPONINI:4 in the last 72 hours No components found with this basename: POCBNP:3 No results found for this basename: DDIMER in the last 72 hours No results found for this basename: HGBA1C in the last 72 hours No results found for this basename: CHOL,HDL,LDLCALC,TRIG,CHOLHDL in the last 72 hours No results found for this basename:  TSH,T4TOTAL,FREET3,T3FREE,THYROIDAB in the last 72 hours No results found for this basename: VITAMINB12,FOLATE,FERRITIN,TIBC,IRON,RETICCTPCT in the last 72 hours  Medications: Scheduled    . amLODipine  10 mg Oral Daily  . aspirin EC  325 mg Oral Daily   Or  . aspirin  324 mg Per Tube Daily  . bisacodyl  10 mg Oral Daily   Or  . bisacodyl  10 mg Rectal Daily  . carvedilol  6.25 mg Oral BID WC  . cloNIDine  0.2 mg Oral BID  . docusate sodium  200 mg Oral Daily  . ferrous sulfate  325 mg Oral Q breakfast  . furosemide  40 mg Oral Daily  . insulin aspart  0-24 Units Subcutaneous TID WC & HS  . pantoprazole  40 mg Oral Q1200  . povidone-iodine  1 application Topical BID  . rosuvastatin  20 mg Oral q1800  . sodium chloride  3 mL Intravenous Q12H     Radiology/Studies:  No results found.  INR: Will add last result for INR, ABG once components are confirmed Will add last 4 CBG results once components are confirmed  Assessment/Plan: S/P Procedure(s) (LRB): CORONARY ARTERY BYPASS GRAFTING (CABG) (N/A)  1. Possible home today after cardiology evaluates yesterdays echo and decides on  life vest.   LOS: 19 days    [name],[name] 1/17/20137:54 AM    Patient seen and examined. Agree with above. He's ready to go from our standpoint once Life Vest arranged. Will increase Lasix to BID- he's still a little edematous, creatinine continues to fall Hopefully home later today

## 2011-12-10 NOTE — Discharge Summary (Signed)
                   [address]            [place] [address]          [phone]      Addendum:   The patient is stable for discharge on today's date. Cardiology feels as though he will require placement of the life vest and this will occur prior to leaving the hospital. Additionally it is felt that he will require increased diuresis and he will be started on Lasix 40 mg twice a day. For other details of this hospitalization please see the previously dictated summary. The most recent medications at discharge will be as follows:   Medication List  As of 12/10/2011 12:15 PM   STOP taking these medications         aspirin 81 MG tablet      atenolol 100 MG tablet      lisinopril-hydrochlorothiazide 20-12.5 MG per tablet      pravastatin 20 MG tablet         TAKE these medications         amLODipine 10 MG tablet   Commonly known as: NORVASC   Take 10 mg by mouth daily.      aspirin 325 MG EC tablet   Take 1 tablet (325 mg total) by mouth daily.      carvedilol 6.25 MG tablet   Commonly known as: COREG   Take 1 tablet (6.25 mg total) by mouth 2 (two) times daily with a meal.      cloNIDine 0.2 MG tablet   Commonly known as: CATAPRES   Take 0.2 mg by mouth 2 (two) times daily.      ferrous sulfate 325 (65 FE) MG tablet   Take 1 tablet (325 mg total) by mouth daily with breakfast. For one month then stop.      furosemide 40 MG tablet   Commonly known as: LASIX   Take 1 tablet (40 mg total) by mouth 2 (two) times daily.      oxyCODONE 5 MG immediate release tablet   Commonly known as: Oxy IR/ROXICODONE   Take 1-2 tablets (5-10 mg total) by mouth every 4 (four) hours as needed for pain.      rosuvastatin 20 MG tablet   Commonly known as: CRESTOR   Take 1 tablet (20 mg total) by mouth daily at 6 PM.

## 2011-12-10 NOTE — Progress Notes (Signed)
CARDIAC REHAB PHASE I   PRE:  Rate/Rhythm: 77 SR  BP:  Supine:   Sitting: 107/64  Standing:    SaO2: 97 RA  MODE:  Ambulation: 550 ft   POST:  Rate/Rhythem: 81  BP:  Supine:   Sitting: 124/74  Standing:    SaO2: 94 RA [id number] Assisted X 1 and used walker to ambulate. Gait steady with walker. Pt  tires easily and takes multiple standing rest stops. States his legs tire out. After walk to recliner with call light in reach.  Francisco [name]

## 2011-12-10 NOTE — Progress Notes (Signed)
[facility]  Subjective: Patient is doing well.  Denies CP and SOB.  Ambulating well without complications.  He expressed his gratefulness for the level of care he has received here.  Objective: Vital signs in last 24 hours: Temp:  [97.1 F (36.2 C)-98 F (36.7 C)] 97.1 F (36.2 C) (01/17 0458) Pulse Rate:  [79-82] 79  (01/17 0458) Resp:  [18] 18  (01/17 0458) BP: (115-126)/(60-79) 125/74 mmHg (01/17 0458) SpO2:  [95 %-97 %] 95 % (01/17 0458) Weight:  [80.74 kg (178 lb)] 80.74 kg (178 lb) (01/17 0458) Last BM Date: 12/09/11  Intake/Output from previous day: 01/16 0701 - 01/17 0700 In: 600 [P.O.:600] Out: 300 [Urine:300] Intake/Output this shift:    Medications Current Facility-Administered Medications  Medication Dose Route Frequency Provider Last Rate Last Dose  . 0.9 %  sodium chloride infusion  250 mL Intravenous PRN Melrose Nakayama, MD      . amLODipine (NORVASC) tablet 10 mg  10 mg Oral Daily Melrose Nakayama, MD   10 mg at 12/09/11 1117  . aspirin EC tablet 325 mg  325 mg Oral Daily Chazz Giovanni, [place]   325 mg at 12/09/11 1117   Or  . aspirin chewable tablet 324 mg  324 mg Per Tube Daily Seville Giovanni, PA      . bisacodyl (DULCOLAX) EC tablet 10 mg  10 mg Oral Daily Muath Giovanni, PA   10 mg at 12/09/11 1118   Or  . bisacodyl (DULCOLAX) suppository 10 mg  10 mg Rectal Daily Isidro Giovanni, PA      . carvedilol (COREG) tablet 6.25 mg  6.25 mg Oral BID WC Troy Sine, MD   6.25 mg at 12/10/11 [id number]  . cloNIDine (CATAPRES) tablet 0.2 mg  0.2 mg Oral BID Melrose Nakayama, MD   0.2 mg at 12/09/11 2111  . docusate sodium (COLACE) capsule 200 mg  200 mg Oral Daily Kaidence Giovanni, PA   200 mg at 12/09/11 1117  . ferrous sulfate tablet 325 mg  325 mg Oral Q breakfast Tharon Aquas Trigt III, MD   325 mg at 12/10/11 [id number]  . furosemide (LASIX) tablet 40 mg  40 mg Oral Daily Melrose Nakayama, MD   40 mg at 12/09/11 1118  . insulin aspart  (novoLOG) injection 0-24 Units  0-24 Units Subcutaneous TID WC & HS Ivin Poot III, MD   2 Units at 12/08/11 1132  . ondansetron (ZOFRAN) injection 4 mg  4 mg Intravenous Q6H PRN Domanique Giovanni, PA      . oxyCODONE (Oxy IR/ROXICODONE) immediate release tablet 5-10 mg  5-10 mg Oral Q3H PRN Melrose Nakayama, MD      . pantoprazole (PROTONIX) EC tablet 40 mg  40 mg Oral Q1200 Keshav Giovanni, PA   40 mg at 12/09/11 1117  . polyvinyl alcohol (LIQUIFILM TEARS) 1.4 % ophthalmic solution 1 drop  1 drop Both Eyes PRN Melrose Nakayama, MD      . povidone-iodine (BETADINE) 10 % external solution 1 application  1 application Topical BID Melrose Nakayama, MD   1 application at [id number]  . rosuvastatin (CRESTOR) tablet 20 mg  20 mg Oral q1800 Sidney Giovanni, PA   20 mg at 12/09/11 1703  . sodium chloride 0.9 % injection 3 mL  3 mL Intravenous Q12H Melrose Nakayama, MD   3 mL at 12/09/11 2111  . sodium chloride  0.9 % injection 3 mL  3 mL Intravenous PRN Melrose Nakayama, MD      . zolpidem [facility]) tablet 5 mg  5 mg Oral QHS PRN Melrose Nakayama, MD        PE:  General appearance: alert, cooperative and no distress Lungs: clear to auscultation bilaterally, diminished at bases bilaterally. Heart: regular rate and rhythm, neg MM Abdomen: soft, non-tender; bowel sounds normal; no masses,  no organomegaly Extremities: 2+ edema lower extremities R>L;  Pulses: 2+ radials bilaterally.   Lab Results:   [facility] 12/09/11 0605 12/08/11 0625  WBC 9.3 7.2  HGB 8.4* 7.5*  HCT 25.1* 22.6*  PLT 204 176   BMET  Basename 12/09/11 0605 12/08/11 0625  NA 138 139  K 4.4 4.3  CL 110 110  CO2 19 19  GLUCOSE 108* 114*  BUN 49* 55*  CREATININE 2.69* 3.04*  CALCIUM 8.0* 7.4*      Assessment/Plan  Principal Problem:  *S/P CABG x 5,  (LIMA-LAD; SEQSVG-RAMUS-OM; SVG-PD; SVG-DIAG) Active Problems:  Ischemic cardiomyopathy - Acute  CAD (coronary artery disease), severe 3 vessel at  cath 11/30/11  Acute respiratory failure requiring BiPap - Secondary to Acute Diastolic (+/- Systolic) CHF in setting of NSTEMI  Tachycardia  HTN (hypertension)  NSTEMI (non-ST elevated myocardial infarction),acute MI  Alternating LBBB and RBBB  Renal failure, acute  Hypotension, transient  Dyslipidemia, LDL 180  Plan: POD 7 s/p CABG X5.  Echo 12/09/11 showed EF 25-30%.  Per Dr. Loletha Grayer needs life vest since EF is not improved.  Patient is on lasix 40mg  daily for diuresis.  Plan for D/C once life vest is fit. BP/HR controlled and stable.  Could use some afterload reduction with hydralzine.  Recommend increasing lasix to 80 bid and obtaining CXR.   LOS: 19 days    HAGER,BRYAN W 12/10/2011 8:33 AM  Agree with note written by Luisa Dago PAC  POD #7. Coming along slowly. Renal fxn slowly improving. I/O +. Exam notable for decreased BS at both bases and 2-3+ pitting edema. Probably would benefit from more aggressive diuresis (i.e 80 mg lasix iv BID)). EF 25% by 2D. Will need life vest prior to D/C.  Lorretta Harp 12/10/2011 8:58 AM

## 2011-12-11 LAB — GLUCOSE, CAPILLARY: Glucose-Capillary: 101 mg/dL — ABNORMAL HIGH (ref 70–99)

## 2011-12-11 MED ORDER — FERROUS SULFATE 325 (65 FE) MG PO TABS: 325.0000 mg | ORAL_TABLET | Freq: Every day | ORAL | Status: DC

## 2011-12-11 MED ORDER — ROSUVASTATIN CALCIUM 20 MG PO TABS: 20.0000 mg | ORAL_TABLET | Freq: Every day | ORAL | Status: DC

## 2011-12-11 NOTE — Progress Notes (Signed)
[facility]  Subjective: Patient awake and doing well.  He just finished eating breakfast.  He ambulated well yesterday with no complaints.  Denies chest pain and SOB.  No major complaints currently.    Objective: Vital signs in last 24 hours: Temp:  [97.4 F (36.3 C)-97.9 F (36.6 C)] 97.9 F (36.6 C) (01/18 0559) Pulse Rate:  [68-73] 68  (01/18 0559) Resp:  [18] 18  (01/18 0559) BP: (105-124)/(68-84) 105/68 mmHg (01/18 0559) SpO2:  [95 %-98 %] 95 % (01/18 0559) Weight:  [79.833 kg (176 lb)] 79.833 kg (176 lb) (01/18 0559) Last BM Date: 12/09/11  Intake/Output from previous day: 01/17 0701 - 01/18 0700 In: 720 [P.O.:720] Out: 400 [Urine:400] Intake/Output this shift:    Medications Current Facility-Administered Medications  Medication Dose Route Frequency Provider Last Rate Last Dose  . 0.9 %  sodium chloride infusion  250 mL Intravenous PRN Melrose Nakayama, MD      . amLODipine (NORVASC) tablet 10 mg  10 mg Oral Daily Melrose Nakayama, MD   10 mg at 12/10/11 1021  . aspirin EC tablet 325 mg  325 mg Oral Daily Osborne Giovanni, [place]   325 mg at 12/10/11 1021   Or  . aspirin chewable tablet 324 mg  324 mg Per Tube Daily Rilee Giovanni, PA      . bisacodyl (DULCOLAX) EC tablet 10 mg  10 mg Oral Daily Marvin Giovanni, PA   10 mg at 12/09/11 1118   Or  . bisacodyl (DULCOLAX) suppository 10 mg  10 mg Rectal Daily Saladin Giovanni, PA      . carvedilol (COREG) tablet 6.25 mg  6.25 mg Oral BID WC Troy Sine, MD   6.25 mg at 12/11/11 0805  . cloNIDine (CATAPRES) tablet 0.2 mg  0.2 mg Oral BID Melrose Nakayama, MD   0.2 mg at 12/10/11 2224  . docusate sodium (COLACE) capsule 200 mg  200 mg Oral Daily Amaan Giovanni, PA   200 mg at 12/10/11 1021  . ferrous sulfate tablet 325 mg  325 mg Oral Q breakfast Tharon Aquas Trigt III, MD   325 mg at 12/11/11 0805  . furosemide (LASIX) tablet 40 mg  40 mg Oral BID Gertrude Giovanni, PA   40 mg at 12/11/11 0805  . insulin  aspart (novoLOG) injection 0-24 Units  0-24 Units Subcutaneous TID WC & HS Ivin Poot III, MD   2 Units at 12/08/11 1132  . ondansetron (ZOFRAN) injection 4 mg  4 mg Intravenous Q6H PRN Finnick Giovanni, PA      . oxyCODONE (Oxy IR/ROXICODONE) immediate release tablet 5-10 mg  5-10 mg Oral Q3H PRN Melrose Nakayama, MD      . pantoprazole (PROTONIX) EC tablet 40 mg  40 mg Oral Q1200 Sanjay Giovanni, PA   40 mg at 12/10/11 1241  . polyvinyl alcohol (LIQUIFILM TEARS) 1.4 % ophthalmic solution 1 drop  1 drop Both Eyes PRN Melrose Nakayama, MD      . povidone-iodine (BETADINE) 10 % external solution 1 application  1 application Topical BID Melrose Nakayama, MD   1 application at 0000000 [id number]  . rosuvastatin (CRESTOR) tablet 20 mg  20 mg Oral q1800 Hershall Giovanni, PA   20 mg at 12/10/11 1846  . sodium chloride 0.9 % injection 3 mL  3 mL Intravenous Q12H Melrose Nakayama, MD   3 mL at 12/10/11 2226  .  sodium chloride 0.9 % injection 3 mL  3 mL Intravenous PRN Melrose Nakayama, MD      . zolpidem [facility]) tablet 5 mg  5 mg Oral QHS PRN Melrose Nakayama, MD      . DISCONTD: furosemide (LASIX) tablet 40 mg  40 mg Oral Daily Melrose Nakayama, MD   40 mg at 12/10/11 1021    PE: General appearance: alert, cooperative and no distress Lungs: clear to auscultation, decreased breath sounds at bases bilaterally. no wheezing or rales. Heart: regular rate and rhythm and no murmurs Extremities: 2+ pitting edema LE bilaterally Pulses: 2+ radials  Lab Results:   Basename 12/09/11 0605  WBC 9.3  HGB 8.4*  HCT 25.1*  PLT 204   BMET  Basename 12/09/11 0605  NA 138  K 4.4  CL 110  CO2 19  GLUCOSE 108*  BUN 49*  CREATININE 2.69*  CALCIUM 8.0*      Assessment/Plan   Principal Problem:  *S/P CABG x 5,  (LIMA-LAD; SEQSVG-RAMUS-OM; SVG-PD; SVG-DIAG) Active Problems:  Ischemic cardiomyopathy - Acute  CAD (coronary artery disease), severe 3 vessel at cath 11/30/11  Acute  respiratory failure requiring BiPap - Secondary to Acute Diastolic (+/- Systolic) CHF in setting of NSTEMI  Tachycardia  HTN (hypertension)  NSTEMI (non-ST elevated myocardial infarction),acute MI  Alternating LBBB and RBBB  Renal failure, acute  Hypotension, transient  Dyslipidemia, LDL 180  Plan:  POD 8 S/P CABG X5. 2D echo showed EF 25%.  Life Vest has been issued and patient has been educated on its use.  BP/HR controlled and stable.  Lasix increased to 40mg  BID.  Recommend CXR.  Will talk to patient about weighing himself daily to make sure he's still diuresing.   LOS: 20 days    HAGER,BRYAN W 12/11/2011 8:31 AM  Agree with note written by Luisa Dago PAC  Progressing nicely post op CABG. SCr 2.7. Severe LVD with EF 25%. Fitted with Life Vest yesterday. Still has 2+ pitting edema. Probably needs more aggressive diuresis. ROV with Dr. Ellouise Newer. Will follow OP 2D to see if there is improvement in LV fxn and to help make the decision about transition to ICD.  Lorretta Harp 12/11/2011 9:01 AM

## 2011-12-11 NOTE — Progress Notes (Signed)
UR Completed.  Vergie Living [id number] 12/11/2011

## 2011-12-11 NOTE — Progress Notes (Addendum)
                    [address]            [place] [address]          [phone]     8 Days Post-Op Procedure(s) (LRB): CORONARY ARTERY BYPASS GRAFTING (CABG) (N/A)  Subjective: Has received Life Vest.  Ready to go home.  Objective: Vital signs in last 24 hours: Patient Vitals for the past 24 hrs:  BP Temp Temp src Pulse Resp SpO2 Weight  12/11/11 0559 105/68 mmHg 97.9 F (36.6 C) Oral 68  18  95 % 79.833 kg (176 lb)  12/10/11 2057 116/84 mmHg 97.9 F (36.6 C) Oral 69  18  95 % -  12/10/11 1330 110/70 mmHg 97.4 F (36.3 C) Oral 73  18  98 % -   Current Weight  12/11/11 79.833 kg (176 lb)     Intake/Output from previous day: 01/17 0701 - 01/18 0700 In: 720 [P.O.:720] Out: 400 [Urine:400]    PHYSICAL EXAM:  Heart: RRR Lungs: clear Wound: stable Extremities: +LE edema  Lab Results: CBC: Basename 12/09/11 0605  WBC 9.3  HGB 8.4*  HCT 25.1*  PLT 204   BMET:  Basename 12/09/11 0605  NA 138  K 4.4  CL 110  CO2 19  GLUCOSE 108*  BUN 49*  CREATININE 2.69*  CALCIUM 8.0*    PT/INR: No results found for this basename: LABPROT,INR in the last 72 hours   Assessment/Plan: S/P Procedure(s) (LRB): CORONARY ARTERY BYPASS GRAFTING (CABG) (N/A) For discharge today.   LOS: 20 days    COLLINS,GINA H 12/11/2011   Agree with above. Home today

## 2012-01-05 ENCOUNTER — Other Ambulatory Visit: Payer: Self-pay | Admitting: Thoracic Surgery (Cardiothoracic Vascular Surgery)

## 2012-01-05 DIAGNOSIS — I2510 Atherosclerotic heart disease of native coronary artery without angina pectoris: Secondary | ICD-10-CM

## 2012-01-12 ENCOUNTER — Ambulatory Visit
Admission: RE | Admit: 2012-01-12 | Discharge: 2012-01-12 | Disposition: A | Discharge status: Home / Self Care | Payer: Medicare Other | Source: Ambulatory Visit | Attending: Thoracic Surgery (Cardiothoracic Vascular Surgery) | Admitting: Thoracic Surgery (Cardiothoracic Vascular Surgery)

## 2012-01-12 ENCOUNTER — Other Ambulatory Visit: Payer: Self-pay | Admitting: Thoracic Surgery (Cardiothoracic Vascular Surgery)

## 2012-01-12 ENCOUNTER — Encounter: Payer: Self-pay | Admitting: Thoracic Surgery (Cardiothoracic Vascular Surgery)

## 2012-01-12 ENCOUNTER — Ambulatory Visit (INDEPENDENT_AMBULATORY_CARE_PROVIDER_SITE_OTHER): Payer: Medicare Other | Admitting: Thoracic Surgery (Cardiothoracic Vascular Surgery)

## 2012-01-12 ENCOUNTER — Ambulatory Visit: Payer: Self-pay | Admitting: Thoracic Surgery (Cardiothoracic Vascular Surgery)

## 2012-01-12 VITALS — BP 161/97 | HR 85 | Resp 16 | Ht 65.0 in | Wt 160.0 lb

## 2012-01-12 DIAGNOSIS — I2510 Atherosclerotic heart disease of native coronary artery without angina pectoris: Secondary | ICD-10-CM

## 2012-01-12 DIAGNOSIS — Z9889 Other specified postprocedural states: Secondary | ICD-10-CM

## 2012-01-12 DIAGNOSIS — Z951 Presence of aortocoronary bypass graft: Secondary | ICD-10-CM

## 2012-01-12 MED ORDER — PREDNISONE (PAK) 10 MG PO TABS: ORAL_TABLET | ORAL | Status: DC

## 2012-01-12 NOTE — Progress Notes (Signed)
HPI:  [name] returns today for a scheduled followup visit after having coronary artery bypass grafting x5 on January 10. [name] presented with severe congestive heart failure. He had severe left ventricular dysfunction with ejection fraction of approximately 20%. He also had assorted other medical problems including renal failure with a creatinine of 3.8. He underwent coronary bypass grafting on the 10th. He did remarkably well postoperatively, without any major complications. He now returns for a scheduled followup visit.  He states that he is doing well. He has minimal discomfort. He denies any shortness of breath at rest or with exertion or with lying flat. He has had a small amount of swelling in his legs and his Lasix dose was recently increased. Overall he feels much better than he did prior to surgery   Current Outpatient Prescriptions  Medication Sig Dispense Refill  . aspirin 325 MG EC tablet Take 325 mg by mouth daily.      . carvedilol (COREG) 6.25 MG tablet Take 1 tablet (6.25 mg total) by mouth 2 (two) times daily with a meal.  60 tablet  1  . ferrous sulfate 325 (65 FE) MG tablet Take 1 tablet (325 mg total) by mouth daily with breakfast. For one month then stop.  30 tablet  1  . furosemide (LASIX) 40 MG tablet Take 80 mg by mouth daily. IN THE AM      . furosemide (LASIX) 40 MG tablet Take 40 mg by mouth daily. IN THE AFTERNOON      . furosemide (LASIX) 40 MG tablet Take 40 mg by mouth 2 (two) times daily. IN THE MORNING      . hydrALAZINE (APRESOLINE) 25 MG tablet Take 12.5 mg by mouth 3 (three) times daily.      . isosorbide mononitrate (IMDUR) 30 MG 24 hr tablet Take 30 mg by mouth daily. IN THE EVENING      . rosuvastatin (CRESTOR) 20 MG tablet Take 1 tablet (20 mg total) by mouth daily at 6 PM.  30 tablet  1  . oxyCODONE (OXY IR/ROXICODONE) 5 MG immediate release tablet       . predniSONE (STERAPRED UNI-PAK) 10 MG tablet Take 5 tablets on day 1, 4 tablets day 2, 3 tablets  day 3, 2 tablets on day 4, 1 tablet on day 5  15 tablet  0    Physical Exam BP 161/97  Pulse 85  Resp 16  Ht 5\' 5"  (1.651 m)  Wt 160 lb (72.576 kg)  BMI 26.63 kg/m2  SpO2 94% Neuro intact Lungs diminished breath sounds left base with dullness to percussion Cardiac regular rate and rhythm 2/6 systolic murmur Sternum stable Incisions clean dry and intact 1+ peripheral edema bilaterally  Diagnostic Tests: Chest x-ray shows a left pleural effusion it was moderate in size  Impression: 66 year old gentleman who underwent high-risk coring bypass grafting on January 10. He did surprisingly well postoperatively. And he continues to do extremely well at this point in time. However he does have a moderate left pleural effusion which I think is large enough to warrant thoracentesis. I discussed with them the risks and benefits of thoracentesis. He understands the risk of bleeding and pneumothorax and agrees to proceed.  From a cardiac standpoint and extremely well. His Lasix has been recently increased, so we will change his Lasix dose after the thoracentesis but we'll give him a prednisone taper. He is no longer limited to 10 pounds of lifting, but should increase his activities gradually. He  continues to wear his life vest and we'll need to do so until his cardiac function reevaluated in about 6 weeks or so.  Plan: Left thoracentesis Prednisone taper Followup with a repeat chest x-ray in 3 weeks   Procedure note  After obtaining informed consent, the chest was prepped and draped in usual fashion. 1% lidocaine was used to anesthetize the insertion site. Left thoracentesis was performed. A 1.8 L of serosanguineous fluid was removed. The patient tolerated the procedure well.

## 2012-01-28 ENCOUNTER — Other Ambulatory Visit: Payer: Self-pay | Admitting: Thoracic Surgery (Cardiothoracic Vascular Surgery)

## 2012-01-28 DIAGNOSIS — I2510 Atherosclerotic heart disease of native coronary artery without angina pectoris: Secondary | ICD-10-CM

## 2012-02-02 ENCOUNTER — Encounter: Payer: Self-pay | Admitting: Thoracic Surgery (Cardiothoracic Vascular Surgery)

## 2012-02-02 ENCOUNTER — Ambulatory Visit (INDEPENDENT_AMBULATORY_CARE_PROVIDER_SITE_OTHER): Payer: Self-pay | Admitting: Thoracic Surgery (Cardiothoracic Vascular Surgery)

## 2012-02-02 ENCOUNTER — Ambulatory Visit
Admission: RE | Admit: 2012-02-02 | Discharge: 2012-02-02 | Disposition: A | Discharge status: Home / Self Care | Payer: Medicare Other | Source: Ambulatory Visit | Attending: Thoracic Surgery (Cardiothoracic Vascular Surgery) | Admitting: Thoracic Surgery (Cardiothoracic Vascular Surgery)

## 2012-02-02 VITALS — BP 141/87 | HR 80 | Resp 18 | Ht 65.0 in | Wt 158.0 lb

## 2012-02-02 DIAGNOSIS — Z951 Presence of aortocoronary bypass graft: Secondary | ICD-10-CM

## 2012-02-02 DIAGNOSIS — J90 Pleural effusion, not elsewhere classified: Secondary | ICD-10-CM

## 2012-02-02 DIAGNOSIS — I2510 Atherosclerotic heart disease of native coronary artery without angina pectoris: Secondary | ICD-10-CM

## 2012-02-02 NOTE — Progress Notes (Signed)
  HPI:  [name] returns for a followup visit. He had presented with an acute MI and acute renal failure. He had severe left ventricular dysfunction. He underwent coronary bypass grafting x5 on January 10 and had an unremarkable postoperative course. He did go home with a life as stated his low EF. Some the office 3 weeks ago which time he was doing well other than a moderate left pleural effusion. We did a thoracentesis and then treated him with a steroid taper he returns today for followup.  [name] states that he feels well. He denies any chest pain or shortness of breath. Says he feels like a new man. He's not requiring any narcotics. He has not noted any swelling, he denies orthopnea.  Current Outpatient Prescriptions  Medication Sig Dispense Refill  . aspirin 325 MG EC tablet Take 325 mg by mouth daily.      . carvedilol (COREG) 6.25 MG tablet Take 12.5 mg by mouth 2 (two) times daily with a meal.      . ferrous sulfate 325 (65 FE) MG tablet Take 1 tablet (325 mg total) by mouth daily with breakfast. For one month then stop.  30 tablet  1  . furosemide (LASIX) 40 MG tablet Take 80 mg by mouth daily. IN THE AM      . hydrALAZINE (APRESOLINE) 25 MG tablet Take 12.5 mg by mouth 3 (three) times daily.      . isosorbide mononitrate (IMDUR) 30 MG 24 hr tablet Take 30 mg by mouth daily. IN THE EVENING      . oxyCODONE (OXY IR/ROXICODONE) 5 MG immediate release tablet       . rosuvastatin (CRESTOR) 20 MG tablet Take 1 tablet (20 mg total) by mouth daily at 6 PM.  30 tablet  1    Physical Exam BP 141/87  Pulse 80  Resp 18  Ht 5\' 5"  (1.651 m)  Wt 158 lb (71.668 kg)  BMI 26.29 kg/m2  SpO2 97% General well-developed well-nourished no acute distress  Lungs diminished breath sounds left base otherwise clear Cardiac regular rate and rhythm 2/6 systolic murmur at left lower sternal border Sternum stable, incision healing well No peripheral edema  Diagnostic Tests: Chest x-ray shows no  recurrence of the pleural effusion, there is mild elevation of left hemidiaphragm and some basilar atelectasis  Impression: [name] doing extremely well at this point in time. I suspect that he has some elevation of left hemidiaphragm due to phrenic nerve dysfunction. This will resolve over time in about 95% of cases. No further x-ray followup as necessary unless he develops recurrent symptoms.  From a cardiac standpoint is doing extremely well. He still wearing his life vest. He is awaiting a call from Dr. Lucy Chris office in in regards to followup on that issue and whether he will need an ICD.  Plan:  I will be happy to see [name] back at any time in the future if I can be of any further assistance with his care.

## 2012-02-05 ENCOUNTER — Other Ambulatory Visit: Payer: Self-pay | Admitting: Physician Assistant

## 2012-02-05 ENCOUNTER — Other Ambulatory Visit: Payer: Self-pay | Admitting: Surgical

## 2012-03-11 DIAGNOSIS — I10 Essential (primary) hypertension: Secondary | ICD-10-CM

## 2012-03-11 HISTORY — DX: Essential (primary) hypertension: I10

## 2013-04-20 ENCOUNTER — Encounter: Payer: Self-pay | Admitting: *Deleted

## 2013-04-21 ENCOUNTER — Encounter: Payer: Self-pay | Admitting: Cardiovascular Disease

## 2013-04-21 ENCOUNTER — Inpatient Hospital Stay (HOSPITAL_COMMUNITY): Admission: RE | Admit: 2013-04-21 | Payer: Medicare Other | Source: Ambulatory Visit

## 2013-04-21 ENCOUNTER — Ambulatory Visit (INDEPENDENT_AMBULATORY_CARE_PROVIDER_SITE_OTHER): Payer: Medicare Other | Admitting: Cardiovascular Disease

## 2013-04-21 VITALS — BP 168/78 | HR 56 | Ht 65.0 in | Wt 181.2 lb

## 2013-04-21 DIAGNOSIS — Z951 Presence of aortocoronary bypass graft: Secondary | ICD-10-CM

## 2013-04-21 DIAGNOSIS — E785 Hyperlipidemia, unspecified: Secondary | ICD-10-CM

## 2013-04-21 DIAGNOSIS — I25810 Atherosclerosis of coronary artery bypass graft(s) without angina pectoris: Secondary | ICD-10-CM

## 2013-04-21 DIAGNOSIS — I10 Essential (primary) hypertension: Secondary | ICD-10-CM

## 2013-04-21 DIAGNOSIS — I2589 Other forms of chronic ischemic heart disease: Secondary | ICD-10-CM

## 2013-04-21 DIAGNOSIS — I2510 Atherosclerotic heart disease of native coronary artery without angina pectoris: Secondary | ICD-10-CM

## 2013-04-21 MED ORDER — HYDRALAZINE HCL 50 MG PO TABS: 50.0000 mg | ORAL_TABLET | Freq: Three times a day (TID) | ORAL | Status: DC

## 2013-04-21 NOTE — Progress Notes (Signed)
Patient ID: [name], male   DOB: 06/25/1946, 67 y.o.   MRN: [id number]  HPI: Winslow Artiaga., is a 67 y.o. male  African American gentleman resents for followup followup cardiology evaluation. He has documented ischemic heart myopathy as well as renal insufficiency. In December 2012 he presented to [facility] with respiratory distress, in for a non-ST segment elevation myocardial infarction,: Cardiac catheterization underwent CABG surgery x5 by Dr. Roxan Hockey the LIMA to the LAD, SVG to the diagonal, sequential SVG to the ramus intermediate and OM1 vessel, and SVG to the PDA. Ejection fraction was 25-30%. He wore a life vest for prophylaxis against sudden arrhythmic cardiac death. Subsequent LV function improved to 35-40% which was noted on his echo in April 2013 in his life this was discontinued. He is followed by Dr. Salem Senate for his progressive renal insufficiency and apparently recently saw Dr. Hassell Done several weeks ago. He tells me laboratory was drawn.  I last saw Mr. Corinna Capra in February 2014. At that time, I recommended he undergo a one-year followup echo Doppler study. Apparently he has not yet had the echo Doppler study done. He presents for evaluation.  The cardiac standpoint, he denies chest pressure. He denies shortness of breath. He is unaware of any arrhythmias. He denies PND orthopnea.  Past Medical History  Diagnosis Date  .  requiring BiPap 11/21/2011  . Acute pulmonary edema 11/21/2011  . Tachycardia 11/21/2011  . NSTEMI (non-ST elevated myocardial infarction) 11/21/2011  . HTN (hypertension) 03/11/12    ECHO-EF-35-45%  . Hyperlipidemia 11/27/11    NUCLEAR STRESS TEST-decreased septal and inferior wall  motion and contractility. Remote apical and inferior wall infarct. EF 24%  . Coronary artery disease     Past Surgical History  Procedure Laterality Date  . Coronary artery bypass graft  12/03/2011    Procedure: CORONARY ARTERY BYPASS GRAFTING (CABG);  Surgeon: Melrose Nakayama, MD;  Location: [facility];  Service: Open Heart Surgery;  Laterality: N/A;  Times 5 on  pump.  using right internal mammary artery and bilateral greater saphenous veins.   . Cardiac catheterization  01/13    Allergies  Allergen Reactions  . Regadenoson Other (See Comments)    Unresponsive, Abdominal Pain    Current Outpatient Prescriptions  Medication Sig Dispense Refill  . aspirin EC 81 MG tablet Take 81 mg by mouth daily.      . carvedilol (COREG) 25 MG tablet Take 25 mg by mouth 2 (two) times daily with a meal.      . hydrALAZINE (APRESOLINE) 50 MG tablet Take 50 mg by mouth 2 (two) times daily.       . isosorbide mononitrate (IMDUR) 30 MG 24 hr tablet Take 30 mg by mouth daily. IN THE EVENING      . losartan (COZAAR) 50 MG tablet Take 50 mg by mouth daily.      . ferrous sulfate 325 (65 FE) MG tablet Take 1 tablet (325 mg total) by mouth daily with breakfast. For one month then stop.  30 tablet  1  . furosemide (LASIX) 40 MG tablet Take 40 mg by mouth daily. IN THE MORNING       . rosuvastatin (CRESTOR) 20 MG tablet Take 1 tablet (20 mg total) by mouth daily at 6 PM.  30 tablet  1   No current facility-administered medications for this visit.    Social he is married has 3 children 3 grandchildren. He does not routinely exercise. There is  no recent tobacco or alcohol use.  ROS as noted above with reference to his cardiovascular review.  Otherwise, he denies fevers chills or night sweats. He denies presyncope or syncope. He denies bleeding. He does admit to some arthritic changes of his joints. Denies significant edema. He tells me his renal function was stable recently when he saw Dr. Hassell Done. Other system review is negative  PE BP 168/78  Pulse 56  Ht 5\' 5"  (1.651 m)  Wt 181 lb 3.2 oz (82.192 kg)  BMI 30.15 kg/m2  General: Alert, oriented, no distress.  HEENT: Normocephalic, atraumatic. Pupils round and reactive; sclera anicteric;  Nose without nasal septal  hypertrophy Mouth/Parynx benign; Mallinpatti scale 3 Neck: No JVD, no carotid briuts Lungs: clear to ausculatation and percussion; no wheezing or rales Heart: RRR, s1 s2 normal [id number] systolic murmur. There is no S3 gallop.  Abdomen: mildly protuberant the oft, nontender; no hepatosplenomehaly, BS+; abdominal aorta nontender and not dilated by palpation. Pulses 2+ Extremities: no clubbinbg cyanosis or edema, Homan's sign negative  Neurologic: grossly nonfocal  ECG: Sinus bradycardia at 56 beats per minute. There is 1 PACU to his previously noted anteroseptal probably progression and ST changes. QTc interval 465 ms. PR interval 164 ms.  LABS:  BMET    Component Value Date/Time   NA 138 12/09/2011 0605   K 4.4 12/09/2011 0605   CL 110 12/09/2011 0605   CO2 19 12/09/2011 0605   GLUCOSE 108* 12/09/2011 0605   BUN 49* 12/09/2011 0605   CREATININE 2.69* 12/09/2011 0605   CALCIUM 8.0* 12/09/2011 0605   GFRNONAA 23* 12/09/2011 0605   GFRAA 27* 12/09/2011 0605     Hepatic Function Panel     Component Value Date/Time   PROT 5.0* 12/06/2011 0416   ALBUMIN 2.1* 12/06/2011 0416   AST 24 12/06/2011 0416   ALT 16 12/06/2011 0416   ALKPHOS 51 12/06/2011 0416   BILITOT 0.4 12/06/2011 0416   BILIDIR <0.1 11/21/2011 1142   IBILI NOT CALCULATED 11/21/2011 1142     CBC    Component Value Date/Time   WBC 9.3 12/09/2011 0605   RBC 2.97* 12/09/2011 0605   HGB 8.4* 12/09/2011 0605   HCT 25.1* 12/09/2011 0605   PLT 204 12/09/2011 0605   MCV 84.5 12/09/2011 0605   MCH 28.3 12/09/2011 0605   MCHC 33.5 12/09/2011 0605   RDW 15.8* 12/09/2011 0605   LYMPHSABS 1.4 12/02/2011 0558   MONOABS 1.2* 12/02/2011 0558   EOSABS 0.2 12/02/2011 0558   BASOSABS 0.0 12/02/2011 0558     BNP    Component Value Date/Time   PROBNP 2538.0* 11/26/2011 0542    Lipid Panel     Component Value Date/Time   CHOL 263* 11/22/2011 0346   TRIG 55 11/22/2011 0346   HDL 72 11/22/2011 0346   CHOLHDL 3.7 11/22/2011 0346   VLDL 11  11/22/2011 0346   LDLCALC 180* 11/22/2011 0346     RADIOLOGY: No results found.    ASSESSMENT AND PLAN: The cardiac standpoint, Mr. Dutkiewicz appears to be fairly well compensated. We will try to obtain his 2-D echo Doppler study today if possible. Mr. be helpful to LV function. He is now 18 months following his non-ST segment elevation myocardial infarction and bypass graft surgery. His blood pressure is elevated. He tells me he did hold some medications today. I am recommending a further titrate his hydralazine to 50 mg every 8 hours from 50 mg twice a day. We will notify him regarding his  echo Doppler studies. I will try to obtain the blood work done recently by Dr. Hassell Done. As long as he is stable I will see him in 4 months for Cardiologic followup evaluation.      Troy Sine, MD, [facility]  04/21/2013 10:03 AM

## 2013-04-21 NOTE — Patient Instructions (Addendum)
Your physician recommends that you schedule a follow-up appointment  As recommended.  Your physician has requested that you have an echocardiogram. Echocardiography is a painless test that uses sound waves to create images of your heart. It provides your doctor with information about the size and shape of your heart and how well your heart's chambers and valves are working. This procedure takes approximately one hour. There are no restrictions for this procedure.  Your physician has recommended you make the following change in your medication: increase the hydralazine to 3 times daily. A new prescription has been sent to your pharmacy to reflect these changes.

## 2013-05-19 ENCOUNTER — Ambulatory Visit (HOSPITAL_COMMUNITY)
Admission: RE | Admit: 2013-05-19 | Discharge: 2013-05-19 | Disposition: A | Discharge status: Home / Self Care | Payer: Medicare Other | Source: Ambulatory Visit | Attending: Cardiovascular Disease | Admitting: Cardiovascular Disease

## 2013-05-19 DIAGNOSIS — I252 Old myocardial infarction: Secondary | ICD-10-CM | POA: Insufficient documentation

## 2013-05-19 DIAGNOSIS — I059 Rheumatic mitral valve disease, unspecified: Secondary | ICD-10-CM | POA: Insufficient documentation

## 2013-05-19 DIAGNOSIS — I079 Rheumatic tricuspid valve disease, unspecified: Secondary | ICD-10-CM | POA: Insufficient documentation

## 2013-05-19 DIAGNOSIS — I25810 Atherosclerosis of coronary artery bypass graft(s) without angina pectoris: Secondary | ICD-10-CM

## 2013-05-19 DIAGNOSIS — E785 Hyperlipidemia, unspecified: Secondary | ICD-10-CM | POA: Insufficient documentation

## 2013-05-19 DIAGNOSIS — I10 Essential (primary) hypertension: Secondary | ICD-10-CM | POA: Insufficient documentation

## 2013-05-19 DIAGNOSIS — I447 Left bundle-branch block, unspecified: Secondary | ICD-10-CM | POA: Insufficient documentation

## 2013-05-19 DIAGNOSIS — I2510 Atherosclerotic heart disease of native coronary artery without angina pectoris: Secondary | ICD-10-CM | POA: Insufficient documentation

## 2013-05-19 NOTE — Progress Notes (Signed)
 [facility]   2D echo completed 05/19/2013.   Jamison Neighbor, RDCS

## 2013-05-30 IMAGING — CR DG CHEST 1V PORT
1 series · 1 of 1 positions shown · non-contrast
Comparison: 07/14/2004

CLINICAL DATA: Shortness of breath and chest pain

PORTABLE CHEST - 1 VIEW

[AP]
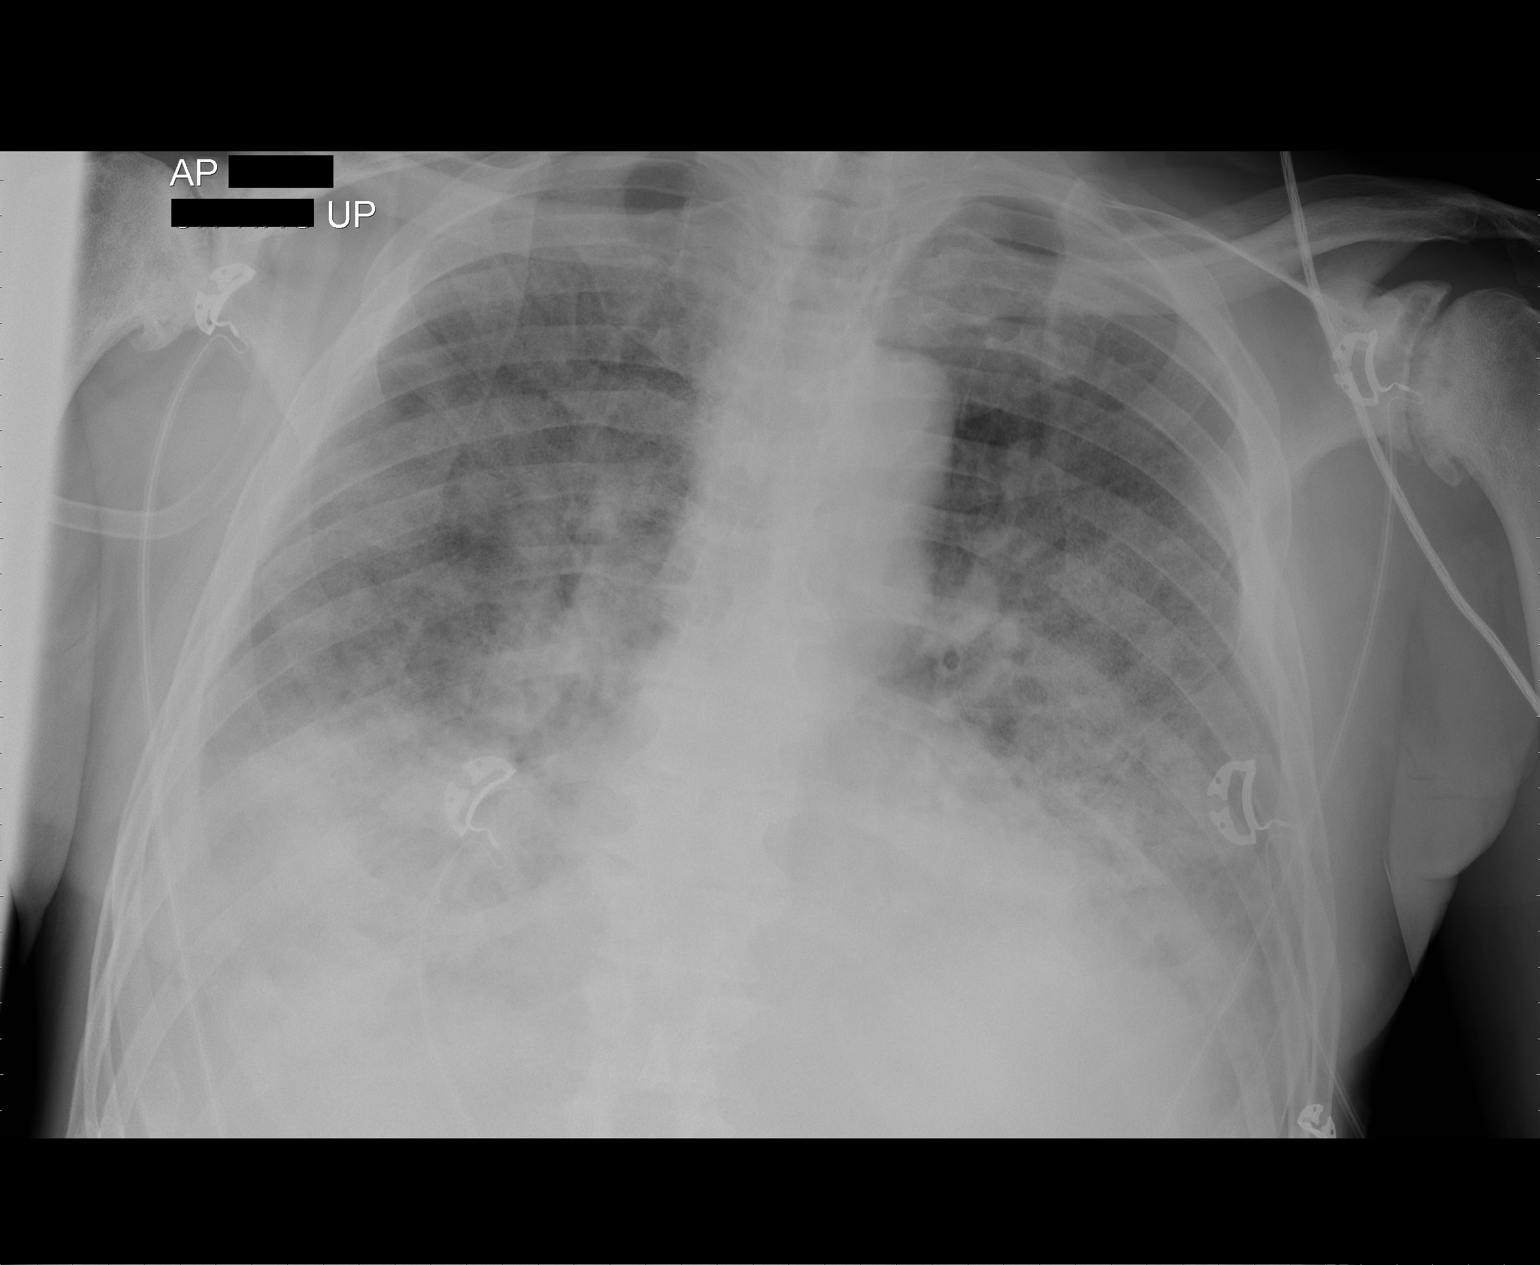

[1 of 1 positions shown; findings below may reference images not displayed]

FINDINGS: There is extensive symmetric patchy bilateral airspace
disease.  Airspace disease partially obscures the cardiac
silhouette.  Overall, heart size appears similar to prior two-view
chest radiograph and appears grossly within normal limits for
portable technique. Pulmonary vascularity appears mildly prominent.
Both costophrenic angles are blunted, for small pleural effusions
cannot be excluded.  No visible pneumothorax.  Degenerative changes
of both shoulders in the thoracic spine are noted.
IMPRESSION: Extensive bilateral airspace disease.  Given the suspected
increased pulmonary vascularity, this is favored to be due to
pulmonary edema.  Bilateral multilobar pneumonia is also a
possibility, and clinical correlation is recommended.

## 2013-05-31 IMAGING — CR DG CHEST 1V PORT
2 series · 2 of 2 positions shown · non-contrast
Comparison: 11/21/2011

CLINICAL DATA: Follow up pulmonary edema.

PORTABLE CHEST - 1 VIEW

[AP (1 of 2)]
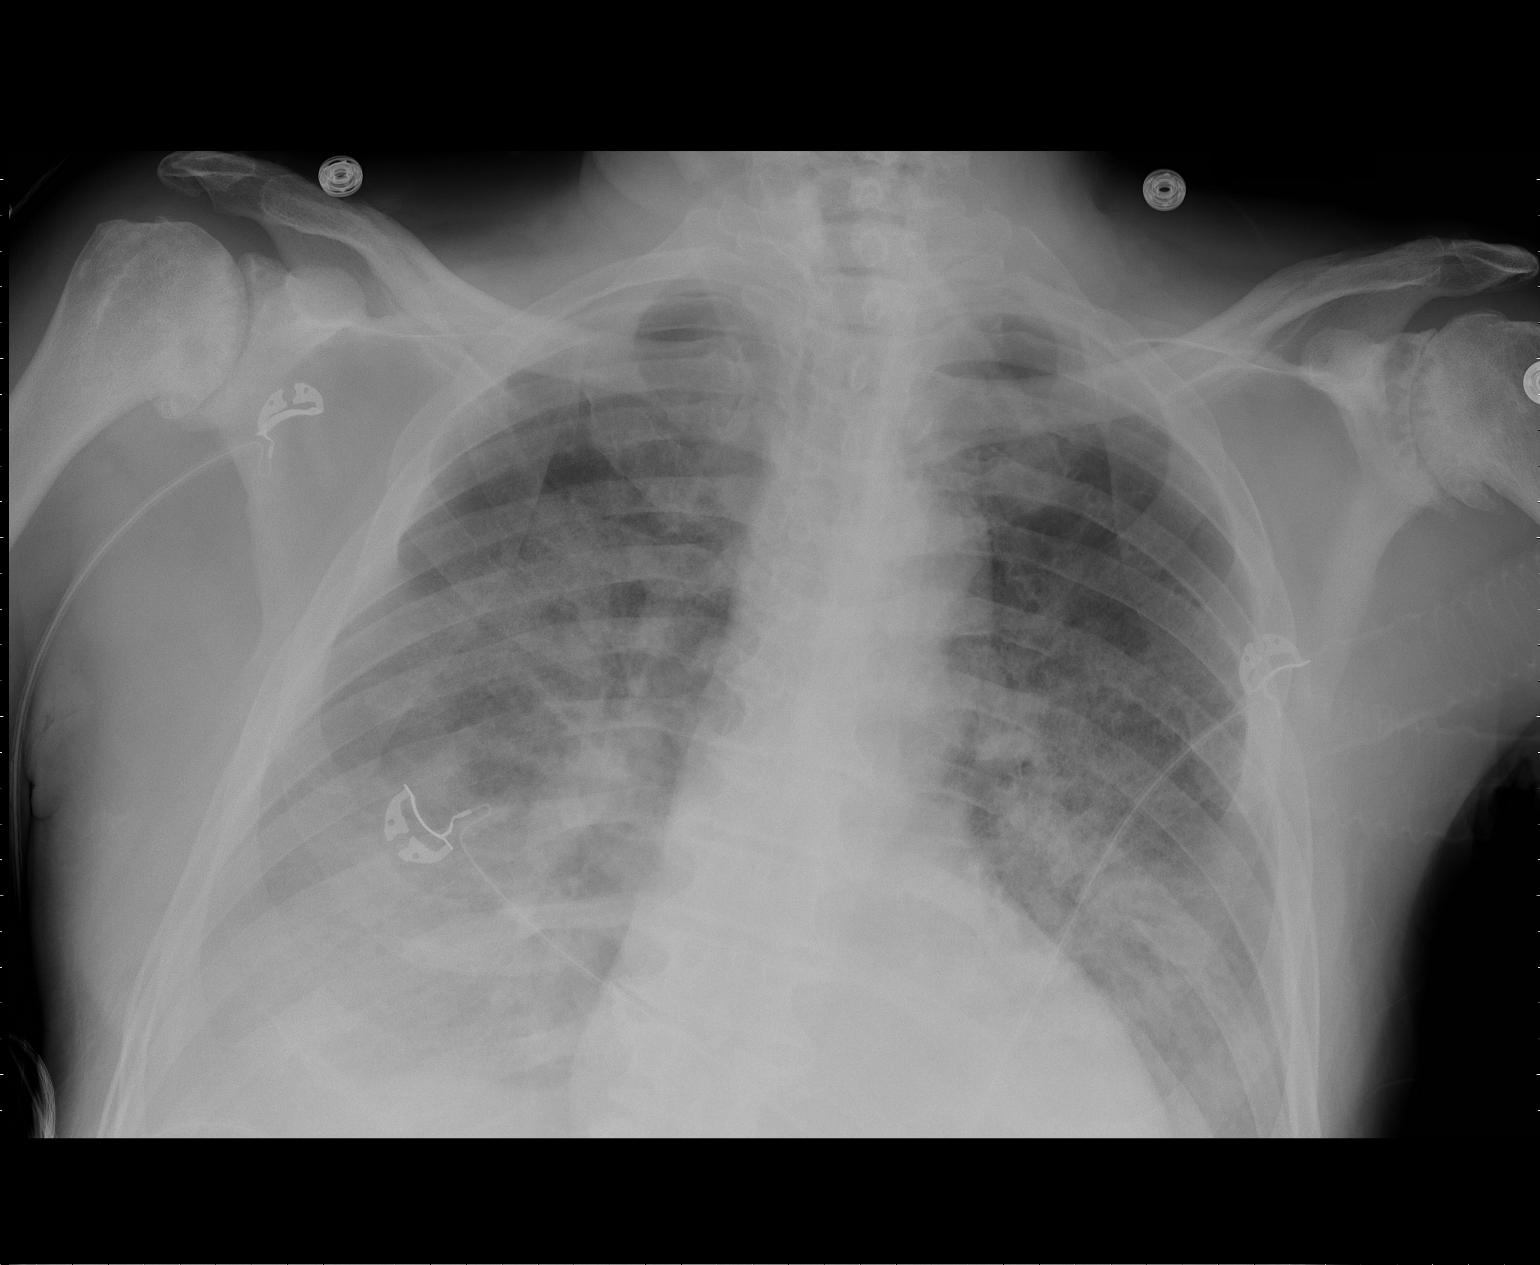

[AP (2 of 2)]
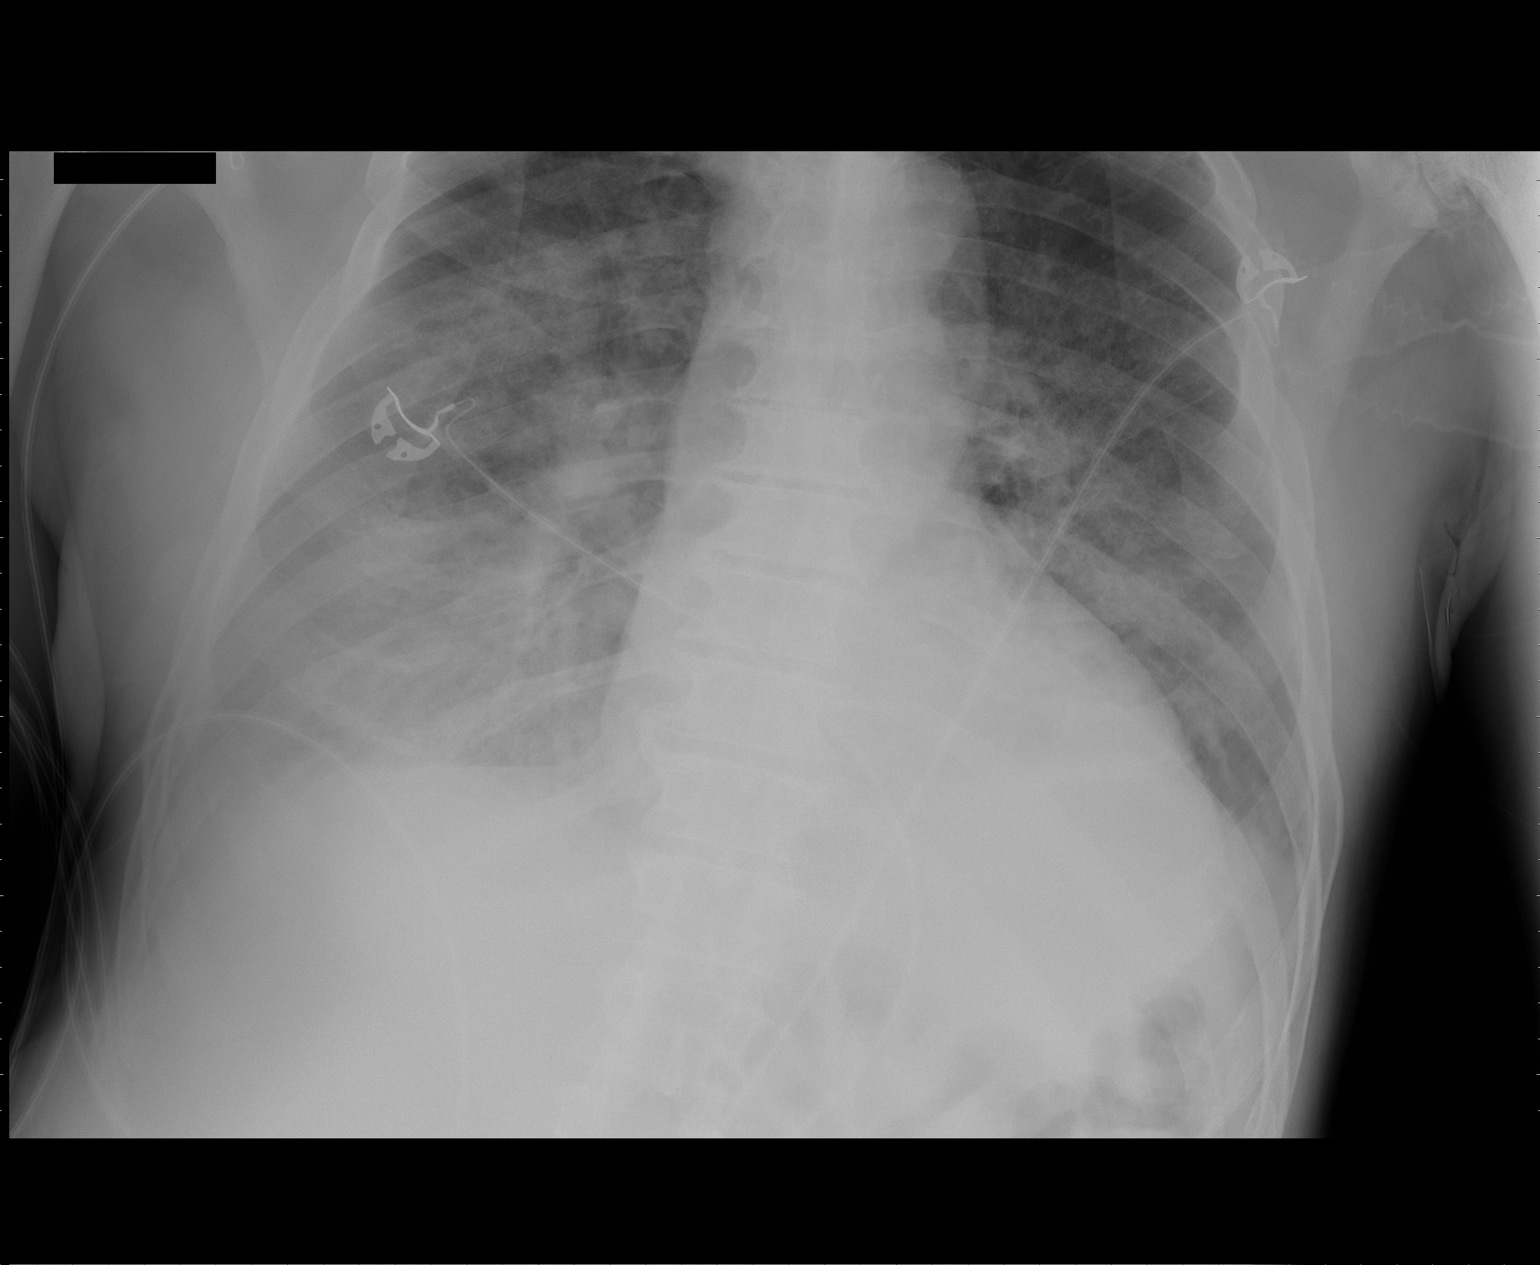

[2 of 2 positions shown; findings below may reference images not displayed]

FINDINGS: Diffuse bilateral airspace disease probable pulmonary
edema or pneumonia again noted without change in aeration.  Again
noted degenerative changes bilateral shoulders.
IMPRESSION: Diffuse bilateral airspace disease probable pulmonary edema or
pneumonia again noted without change in aeration.

## 2013-06-01 IMAGING — CR DG CHEST 1V PORT
1 series · 1 of 1 positions shown · non-contrast
Comparison: 11/22/2011 and prior radiographs dating back to
07/14/2004

CLINICAL DATA: 65-year-old male with respiratory failure.

PORTABLE CHEST - 1 VIEW

[AP]
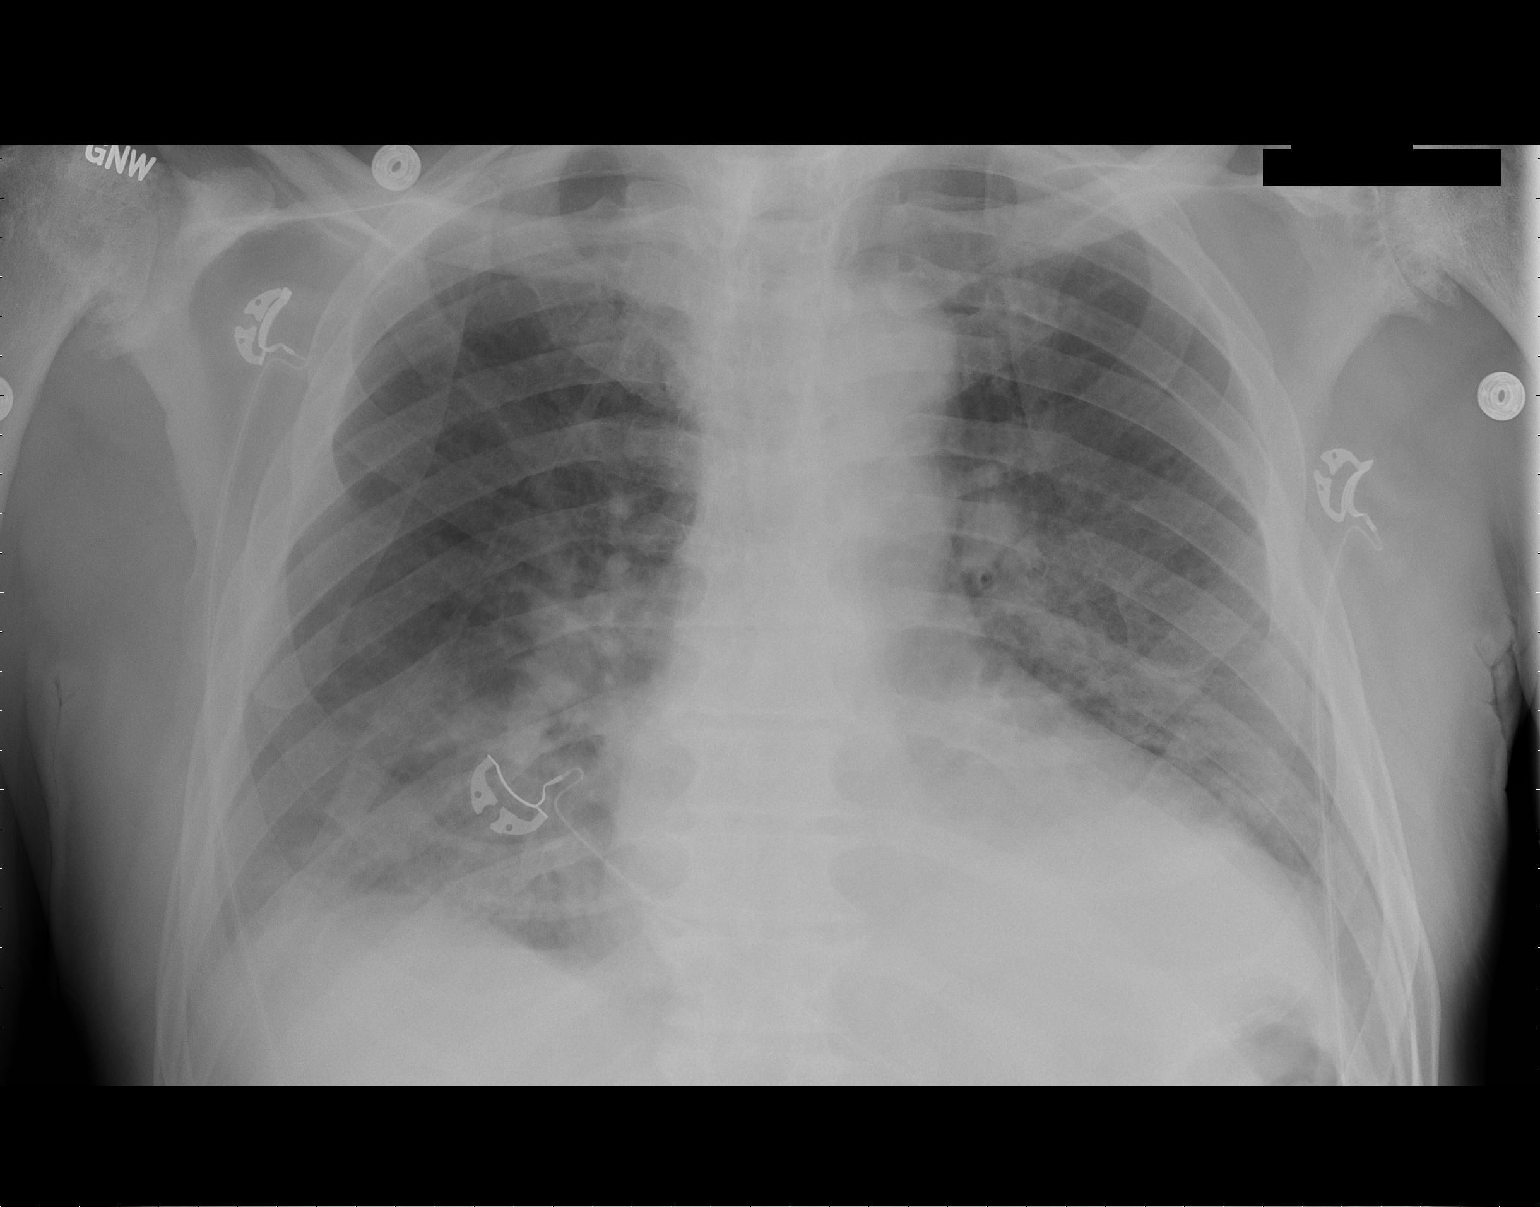

[1 of 1 positions shown; findings below may reference images not displayed]

FINDINGS: The cardiomediastinal silhouette is unremarkable.
Improved aeration/edema is noted.
Mild bibasilar atelectasis is again identified.
There is no evidence of pneumothorax.
Severe degenerative changes of both shoulders are present.
IMPRESSION: Improved aeration/edema.

## 2013-06-03 IMAGING — CR DG CHEST 1V PORT
1 series · 1 of 1 positions shown · non-contrast
Comparison: 11/23/2011.

CLINICAL DATA: Congestive heart failure.  High blood pressure.

PORTABLE CHEST - 1 VIEW

[AP]
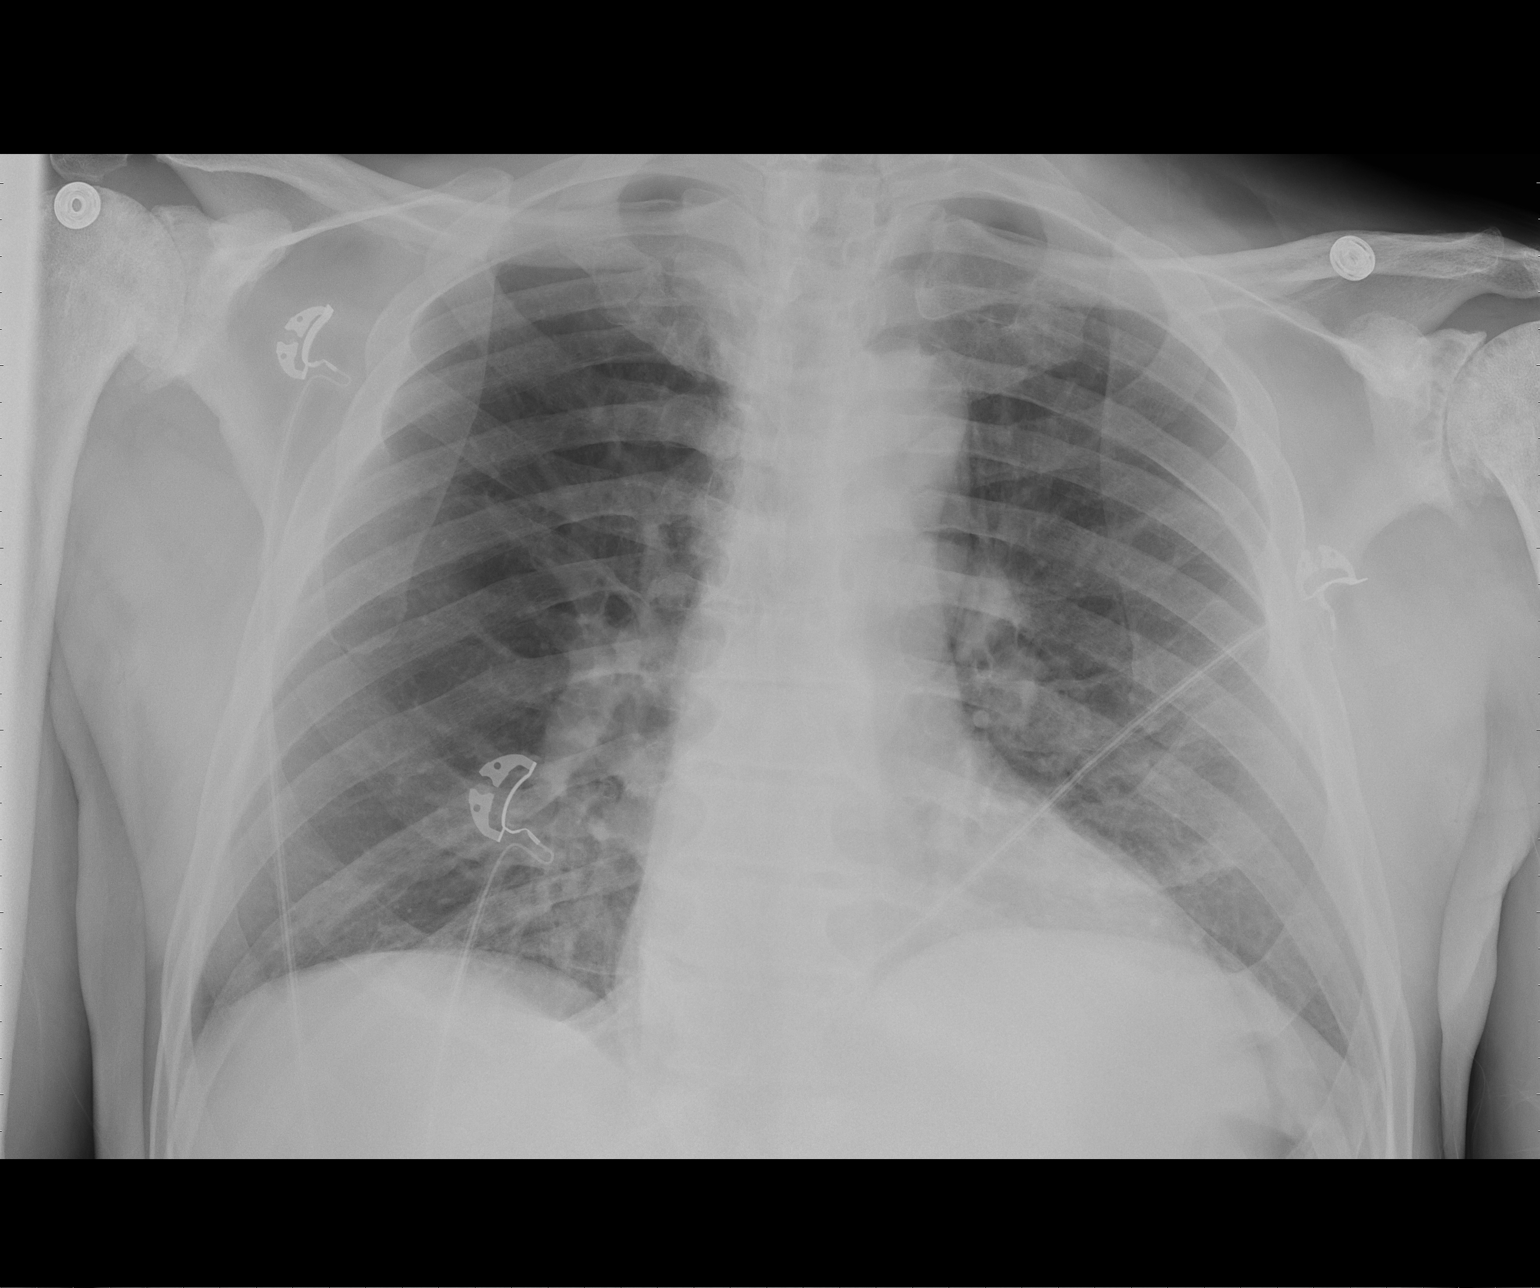

[1 of 1 positions shown; findings below may reference images not displayed]

FINDINGS: Interval improvement in degree of asymmetric air space
disease.  No gross pneumothorax.  Heart size top normal.

Tortuous aorta.

Bilateral glenohumeral joint degenerative changes.
IMPRESSION: Interval improvement in degree of asymmetric air space disease.

## 2013-06-07 ENCOUNTER — Other Ambulatory Visit: Payer: Self-pay | Admitting: Cardiovascular Disease

## 2013-06-08 NOTE — Telephone Encounter (Signed)
Rx was sent to pharmacy electronically. 

## 2013-06-13 ENCOUNTER — Encounter: Payer: Self-pay | Admitting: *Deleted

## 2013-07-11 ENCOUNTER — Other Ambulatory Visit: Payer: Self-pay | Admitting: Cardiovascular Disease

## 2013-07-21 IMAGING — CR DG CHEST 2V SAME DAY
2 series · 2 of 2 positions shown · non-contrast
Comparison: 09/10/2012

CLINICAL DATA: Post thoracentesis

CHEST - 2 VIEW SAME DAY

[w chest pa]
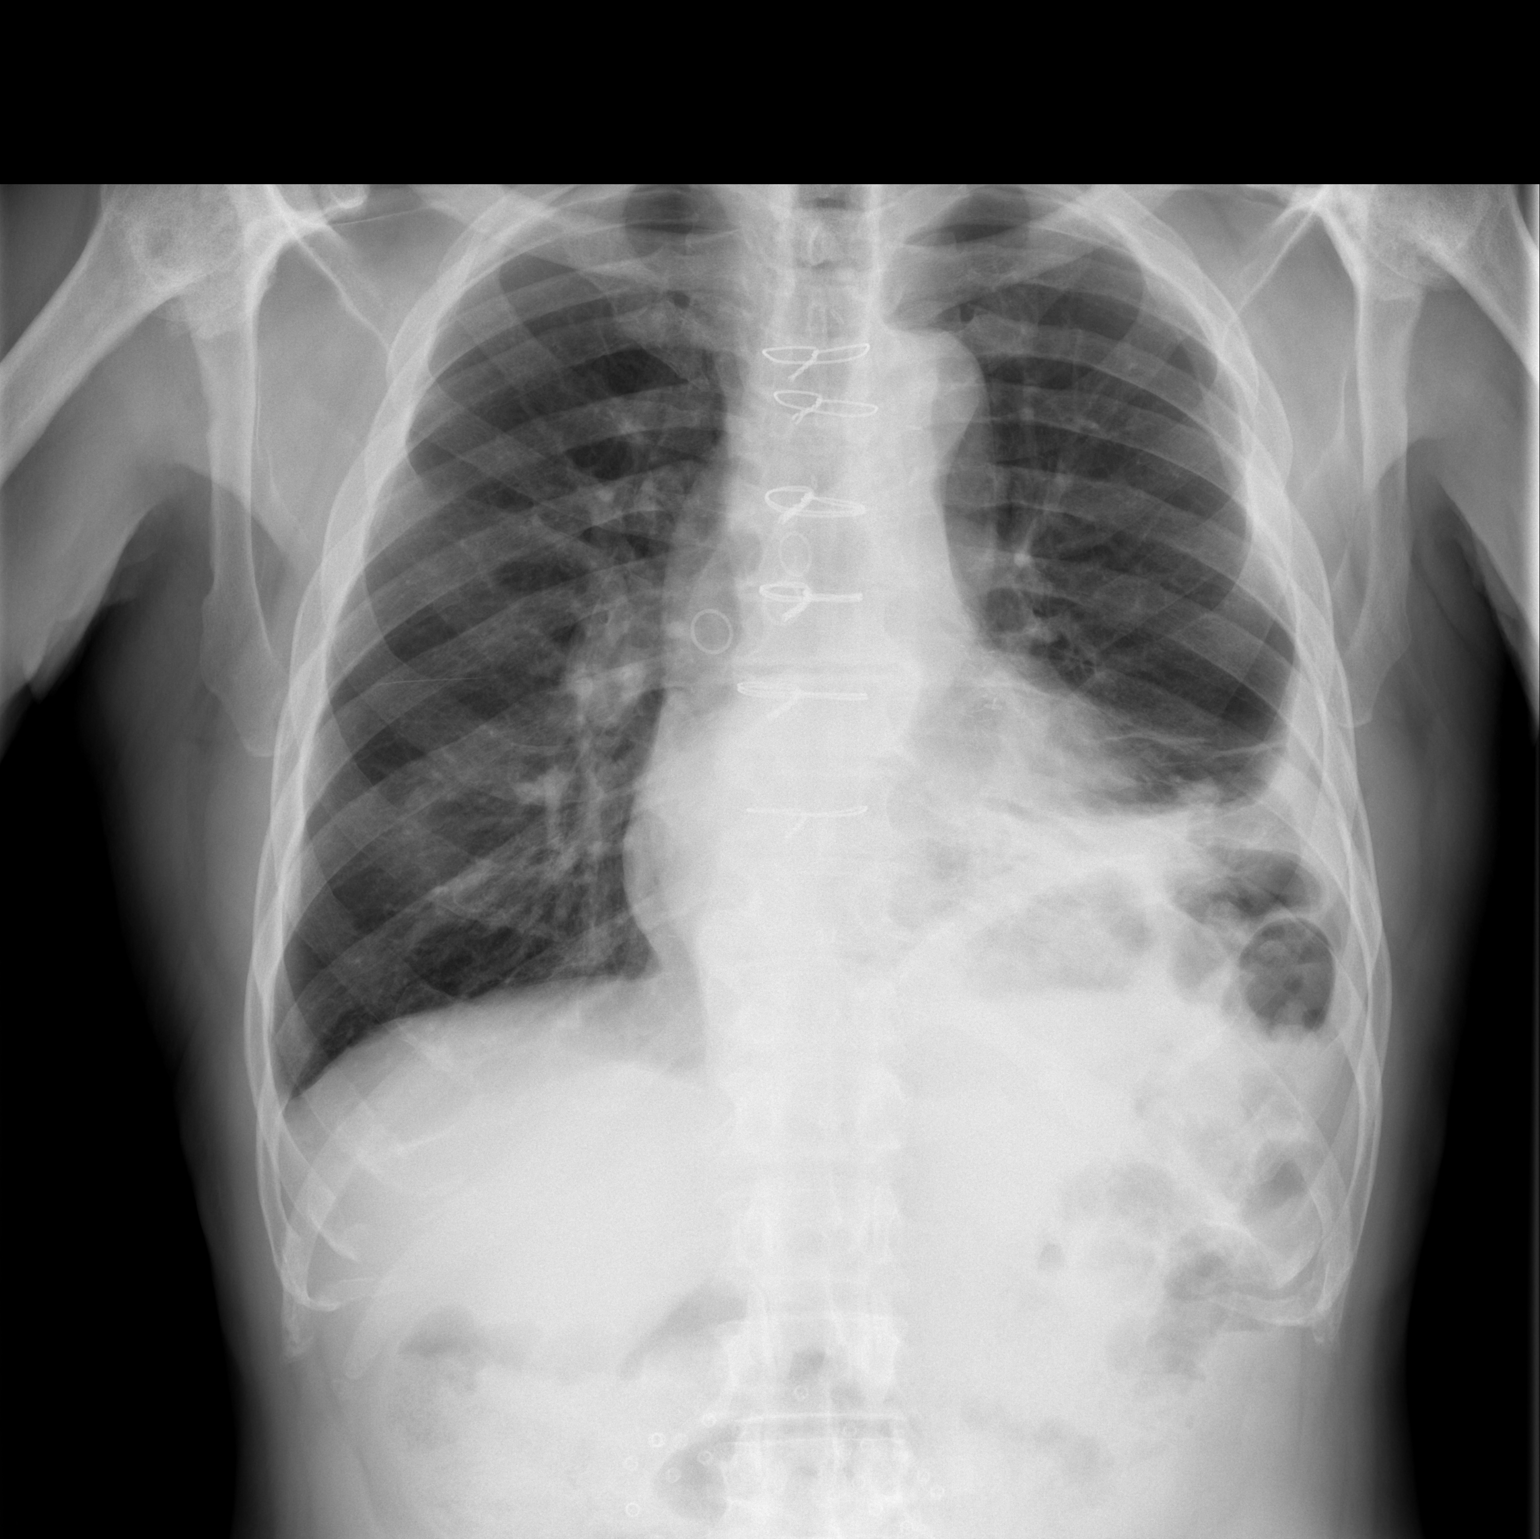

[w chest lat]
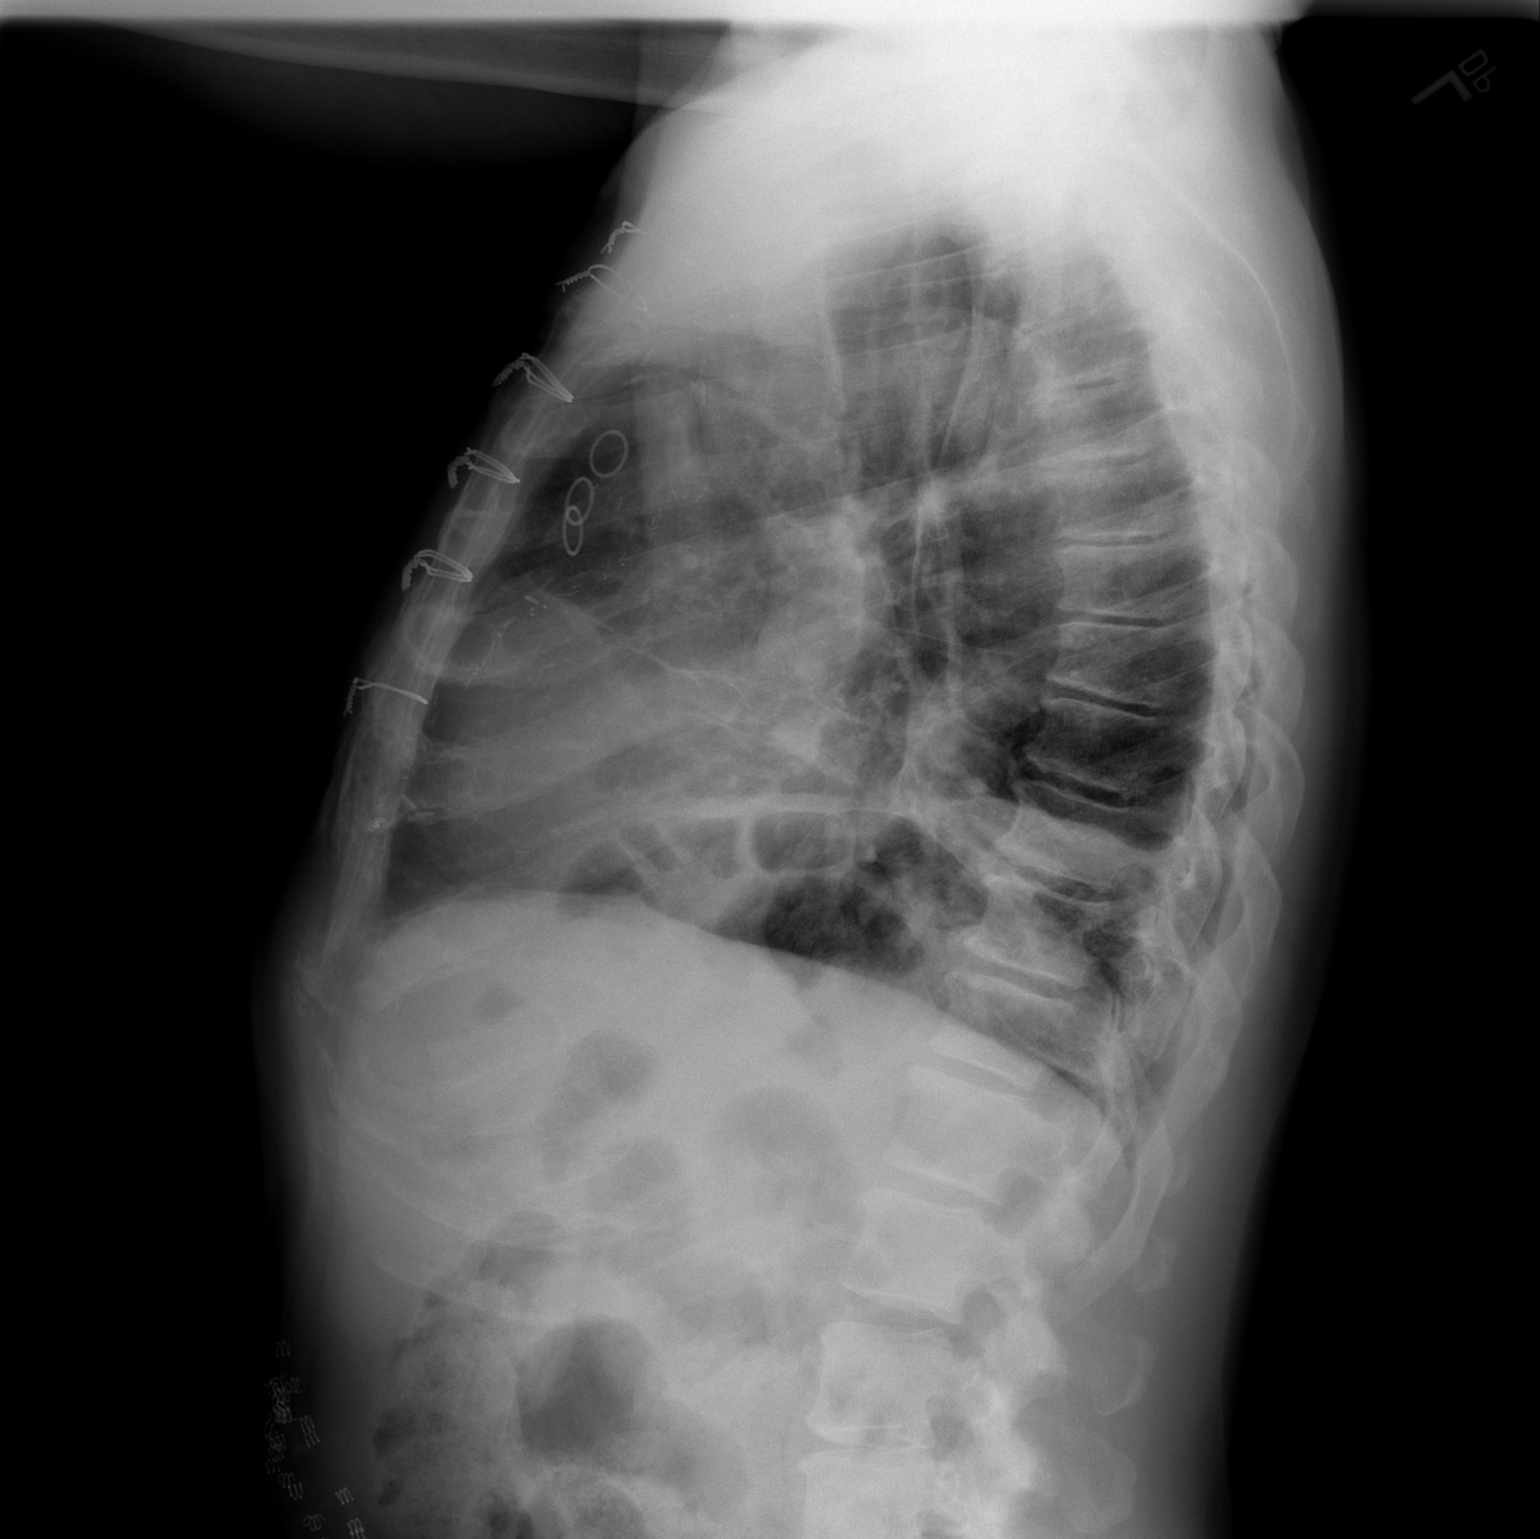

[2 of 2 positions shown; findings below may reference images not displayed]

FINDINGS: Decrease in left pleural effusion compared with the
earlier study.  There remains a small left effusion and left lower
lobe atelectasis.  No pneumothorax.

Right lung is clear.  Negative for heart failure.
IMPRESSION: Significant decrease in left pleural effusion following
thoracentesis.  Negative for pneumothorax.

## 2013-08-02 ENCOUNTER — Other Ambulatory Visit: Payer: Self-pay | Admitting: Cardiovascular Disease

## 2013-08-02 NOTE — Telephone Encounter (Signed)
Rx was sent to pharmacy electronically. 

## 2013-09-21 ENCOUNTER — Telehealth: Payer: Self-pay | Admitting: Cardiovascular Disease

## 2013-09-21 NOTE — Telephone Encounter (Signed)
Error

## 2013-10-04 ENCOUNTER — Ambulatory Visit: Payer: Medicare Other | Admitting: Cardiovascular Disease

## 2013-10-13 ENCOUNTER — Ambulatory Visit: Payer: Medicare Other | Admitting: Cardiovascular Disease

## 2013-10-27 ENCOUNTER — Other Ambulatory Visit: Payer: Self-pay | Admitting: *Deleted

## 2013-10-27 ENCOUNTER — Ambulatory Visit (INDEPENDENT_AMBULATORY_CARE_PROVIDER_SITE_OTHER): Payer: Medicare Other | Admitting: Cardiovascular Disease

## 2013-10-27 ENCOUNTER — Encounter: Payer: Self-pay | Admitting: Cardiovascular Disease

## 2013-10-27 VITALS — BP 128/90 | HR 62 | Ht 65.0 in | Wt 194.6 lb

## 2013-10-27 DIAGNOSIS — E785 Hyperlipidemia, unspecified: Secondary | ICD-10-CM

## 2013-10-27 DIAGNOSIS — I2510 Atherosclerotic heart disease of native coronary artery without angina pectoris: Secondary | ICD-10-CM

## 2013-10-27 DIAGNOSIS — Z951 Presence of aortocoronary bypass graft: Secondary | ICD-10-CM

## 2013-10-27 DIAGNOSIS — I214 Non-ST elevation (NSTEMI) myocardial infarction: Secondary | ICD-10-CM

## 2013-10-27 DIAGNOSIS — I10 Essential (primary) hypertension: Secondary | ICD-10-CM

## 2013-10-27 DIAGNOSIS — I2589 Other forms of chronic ischemic heart disease: Secondary | ICD-10-CM

## 2013-10-27 DIAGNOSIS — N183 Chronic kidney disease, stage 3 unspecified: Secondary | ICD-10-CM | POA: Insufficient documentation

## 2013-10-27 NOTE — Progress Notes (Signed)
Patient ID: Francisco Shoulder., male   DOB: 08-Dec-1945, 67 y.o.   MRN: [id number]    HPI: Francisco Wolden., is a 67 y.o. male  African American gentleman presents for 6 month followup followup cardiology evaluation.   Francisco [name] has a history of an  ischemic cardiomyopathy as well as renal insufficiency. In December 2012 he presented to [facility] with respiratory distress, and ruled in for a non-ST segment elevation myocardial infarction.  He underwent t CABG surgery x5 by Dr. Roxan Hockey the LIMA to the LAD, SVG to the diagonal, sequential SVG to the ramus intermediate and OM1 vessel, and SVG to the PDA. Initial ejection fraction was 25-30%. He wore a life vest for prophylaxis against sudden arrhythmic cardiac death. Subsequent LV function improved to 35-40% which was noted on his echo in April 2013 in his life this was discontinued. He wasfollowed by Dr. Salem Senate for his progressive renal insufficiency and since Dr. Cherlyn Cushing retirement he now sees Dr. Joelyn Oms. He tells me he had laboratory drawn in the nephrology office 2 weeks ago.  I last saw Francisco [name] in May 2014.  Over the past 6 months, he has continued to be fairly stable. He is unaware of any palpitations. He denies any PND or orthopnea. He denies chest pressure. At times he does note some mild leg swelling. He tells me his dose of hydralazine was changed from 50 mg 3 times a day to 50 twice a day. He presents now for evaluation  Past Medical History  Diagnosis Date  .  requiring BiPap 11/21/2011  . Acute pulmonary edema 11/21/2011  . Tachycardia 11/21/2011  . NSTEMI (non-ST elevated myocardial infarction) 11/21/2011  . HTN (hypertension) 03/11/12    ECHO-EF-35-45%  . Hyperlipidemia 11/27/11    NUCLEAR STRESS TEST-decreased septal and inferior wall  motion and contractility. Remote apical and inferior wall infarct. EF 24%  . Coronary artery disease     Past Surgical History  Procedure Laterality Date  . Coronary artery bypass graft   12/03/2011    Procedure: CORONARY ARTERY BYPASS GRAFTING (CABG);  Surgeon: Melrose Nakayama, MD;  Location: [place];  Service: Open Heart Surgery;  Laterality: N/A;  Times 5 on  pump.  using right internal mammary artery and bilateral greater saphenous veins.   . Cardiac catheterization  01/13    Allergies  Allergen Reactions  . Regadenoson Other (See Comments)    Unresponsive, Abdominal Pain    Current Outpatient Prescriptions  Medication Sig Dispense Refill  . aspirin EC 81 MG tablet Take 81 mg by mouth daily.      . carvedilol (COREG) 25 MG tablet TAKE 1 TABLET BY MOUTH TWICE A DAY  60 tablet  8  . furosemide (LASIX) 40 MG tablet Take 40 mg by mouth daily. IN THE MORNING      . hydrALAZINE (APRESOLINE) 50 MG tablet Take 1 tablet (50 mg total) by mouth 3 (three) times daily.  90 tablet  6  . isosorbide mononitrate (IMDUR) 30 MG 24 hr tablet TAKE 1 TABLET ONCE DAILY.  30 tablet  7  . losartan (COZAAR) 50 MG tablet TAKE 1 TABLET EVERY DAY  30 tablet  7  . rosuvastatin (CRESTOR) 20 MG tablet Take 1 tablet (20 mg total) by mouth daily at 6 PM.  30 tablet  1   No current facility-administered medications for this visit.    Social he is married has 3 children 3 grandchildren. He does not routinely exercise. There  is no recent tobacco or alcohol use.  ROS as noted above with reference to his cardiovascular review. He denies fevers chills or night sweats.  He is skin rash. He denies bleeding. He denies easy bruisability. He denies PND orthopnea. He is unaware of palpitations. He denies chest pressure. He denies nausea vomiting or diarrhea. He denies blood in his stool or urine. He does note some mild leg swelling. There is no diabetes. There is no cold intolerance. A comprehensive 12 point system review is negative  PE BP 128/90  Pulse 62  Ht 5\' 5"  (1.651 m)  Wt 88.27 kg (194 lb 9.6 oz)  BMI 32.38 kg/m2  General: Alert, oriented, no distress.  HEENT: Normocephalic, atraumatic. Pupils  round and reactive; sclera anicteric;  Nose without nasal septal hypertrophy Mouth/Parynx benign; Mallinpatti scale 3 Neck: No JVD, no carotid briuts Lungs: clear to ausculatation and percussion; no wheezing or rales Heart: RRR, s1 s2 normal A999333 systolic murmur. There is no S3 gallop.  Abdomen: mildly protuberant the oft, nontender; no hepatosplenomehaly, BS+; abdominal aorta nontender and not dilated by palpation. Pulses 2+ Extremities: Trace bilateral ankle swelling; no clubbinbg cyanosis, Homan's sign negative  Neurologic: grossly nonfocal Psychologic: Normal affect and mood appear  ECG: Sinus rhythm at 62 beats per minute with left bundle branch block. PA-C. Mild sinus arrhythmia  LABS:  BMET    Component Value Date/Time   NA 138 12/09/2011 0605   K 4.4 12/09/2011 0605   CL 110 12/09/2011 0605   CO2 19 12/09/2011 0605   GLUCOSE 108* 12/09/2011 0605   BUN 49* 12/09/2011 0605   CREATININE 2.69* 12/09/2011 0605   CALCIUM 8.0* 12/09/2011 0605   GFRNONAA 23* 12/09/2011 0605   GFRAA 27* 12/09/2011 0605     Hepatic Function Panel     Component Value Date/Time   PROT 5.0* 12/06/2011 0416   ALBUMIN 2.1* 12/06/2011 0416   AST 24 12/06/2011 0416   ALT 16 12/06/2011 0416   ALKPHOS 51 12/06/2011 0416   BILITOT 0.4 12/06/2011 0416   BILIDIR <0.1 11/21/2011 1142   IBILI NOT CALCULATED 11/21/2011 1142     CBC    Component Value Date/Time   WBC 9.3 12/09/2011 0605   RBC 2.97* 12/09/2011 0605   HGB 8.4* 12/09/2011 0605   HCT 25.1* 12/09/2011 0605   PLT 204 12/09/2011 0605   MCV 84.5 12/09/2011 0605   MCH 28.3 12/09/2011 0605   MCHC 33.5 12/09/2011 0605   RDW 15.8* 12/09/2011 0605   LYMPHSABS 1.4 12/02/2011 0558   MONOABS 1.2* 12/02/2011 0558   EOSABS 0.2 12/02/2011 0558   BASOSABS 0.0 12/02/2011 0558     BNP    Component Value Date/Time   PROBNP 2538.0* 11/26/2011 0542    Lipid Panel     Component Value Date/Time   CHOL 263* 11/22/2011 0346   TRIG 55 11/22/2011 0346   HDL 72 11/22/2011  0346   CHOLHDL 3.7 11/22/2011 0346   VLDL 11 11/22/2011 0346   LDLCALC 180* 11/22/2011 0346     RADIOLOGY: No results found.    ASSESSMENT AND PLAN: The cardiac standpoint, Francisco [name] appears to be fairly well compensated. Initially, he had an ejection fraction in the 25% range. This had improved to 35-40% in April 2013 in June 2014 ejection fraction was reported at 55%. Presently, his blood pressure is controlled. He did have laboratory with reference to his renal insufficiency several weeks ago. I will try to obtain these results for my review. He does  have trace edema. He is on furosemide he is unaware of any palpitations on his current dose of carvedilol 25 twice a day. He is on Crestor for hyperlipidemia. In June 2015 I am recommending that he undergo a followup echo Doppler study. I'll see him back in the office in followup and further recommendations at that time.    Troy Sine, MD, [facility]  10/27/2013 11:18 AM

## 2013-10-27 NOTE — Patient Instructions (Addendum)
Your physician has requested that you have an echocardiogram. Echocardiography is a painless test that uses sound waves to create images of your heart. It provides your doctor with information about the size and shape of your heart and how well your heart's chambers and valves are working. This procedure takes approximately one hour. There are no restrictions for this procedure. This will be done in June 2015.  Your physician recommends that you schedule a follow-up appointment in: June 2015.

## 2013-10-30 ENCOUNTER — Encounter: Payer: Self-pay | Admitting: Cardiovascular Disease

## 2013-11-29 ENCOUNTER — Other Ambulatory Visit: Payer: Self-pay | Admitting: Ophthalmology

## 2013-12-18 ENCOUNTER — Other Ambulatory Visit (HOSPITAL_COMMUNITY): Payer: Medicare Other

## 2013-12-25 ENCOUNTER — Ambulatory Visit (HOSPITAL_COMMUNITY): Admission: RE | Admit: 2013-12-25 | Payer: Medicare Other | Source: Ambulatory Visit | Admitting: Ophthalmology

## 2013-12-25 ENCOUNTER — Encounter (HOSPITAL_COMMUNITY): Admission: RE | Payer: Self-pay | Source: Ambulatory Visit

## 2013-12-25 SURGERY — PARS PLANA VITRECTOMY WITH 25 GAUGE
Anesthesia: Monitor Anesthesia Care | Laterality: Right

## 2014-05-09 ENCOUNTER — Other Ambulatory Visit: Payer: Self-pay | Admitting: *Deleted

## 2014-05-09 MED ORDER — ISOSORBIDE MONONITRATE ER 30 MG PO TB24: 30.0000 mg | ORAL_TABLET | Freq: Every day | ORAL | Status: DC

## 2014-05-09 MED ORDER — HYDRALAZINE HCL 50 MG PO TABS: 50.0000 mg | ORAL_TABLET | Freq: Three times a day (TID) | ORAL | Status: DC

## 2014-05-09 NOTE — Telephone Encounter (Signed)
Electronic refills on isosorbide 30mg  and hyralazine 25mg  w/1 refill and note needs appt. [facility] [place], [place].

## 2014-07-11 ENCOUNTER — Other Ambulatory Visit: Payer: Self-pay | Admitting: *Deleted

## 2014-07-11 MED ORDER — ISOSORBIDE MONONITRATE ER 30 MG PO TB24: 30.0000 mg | ORAL_TABLET | Freq: Every day | ORAL | Status: DC

## 2014-07-17 ENCOUNTER — Other Ambulatory Visit: Payer: Self-pay | Admitting: *Deleted

## 2014-07-17 MED ORDER — ISOSORBIDE MONONITRATE ER 30 MG PO TB24: 30.0000 mg | ORAL_TABLET | Freq: Every day | ORAL | Status: DC

## 2014-07-26 ENCOUNTER — Other Ambulatory Visit: Payer: Self-pay | Admitting: Cardiovascular Disease

## 2014-07-26 NOTE — Telephone Encounter (Signed)
Rx refilled denied to patient pharmacy. Rx was filled 04/2014

## 2014-08-15 ENCOUNTER — Ambulatory Visit: Payer: Medicare Other | Admitting: Cardiovascular Disease

## 2014-08-23 ENCOUNTER — Other Ambulatory Visit: Payer: Self-pay | Admitting: Cardiovascular Disease

## 2014-08-24 NOTE — Telephone Encounter (Signed)
Rx was sent to pharmacy electronically. 

## 2014-08-31 ENCOUNTER — Ambulatory Visit: Payer: Medicare Other | Admitting: Cardiovascular Disease

## 2014-09-18 ENCOUNTER — Other Ambulatory Visit: Payer: Self-pay | Admitting: Cardiovascular Disease

## 2014-10-05 ENCOUNTER — Other Ambulatory Visit: Payer: Self-pay | Admitting: Cardiovascular Disease

## 2014-10-05 NOTE — Telephone Encounter (Signed)
Rx was denied. Rx was filled 09/18/14

## 2014-10-15 ENCOUNTER — Ambulatory Visit: Payer: Medicare Other | Admitting: Cardiovascular Disease

## 2014-10-25 ENCOUNTER — Ambulatory Visit (INDEPENDENT_AMBULATORY_CARE_PROVIDER_SITE_OTHER): Payer: Medicare Other | Admitting: Cardiovascular Disease

## 2014-10-25 ENCOUNTER — Encounter: Payer: Self-pay | Admitting: Cardiovascular Disease

## 2014-10-25 VITALS — BP 144/82 | HR 66 | Ht 65.0 in | Wt 185.4 lb

## 2014-10-25 DIAGNOSIS — Z79899 Other long term (current) drug therapy: Secondary | ICD-10-CM

## 2014-10-25 DIAGNOSIS — I2510 Atherosclerotic heart disease of native coronary artery without angina pectoris: Secondary | ICD-10-CM

## 2014-10-25 DIAGNOSIS — E785 Hyperlipidemia, unspecified: Secondary | ICD-10-CM

## 2014-10-25 DIAGNOSIS — N183 Chronic kidney disease, stage 3 unspecified: Secondary | ICD-10-CM

## 2014-10-25 DIAGNOSIS — E782 Mixed hyperlipidemia: Secondary | ICD-10-CM

## 2014-10-25 DIAGNOSIS — N189 Chronic kidney disease, unspecified: Secondary | ICD-10-CM

## 2014-10-25 DIAGNOSIS — I10 Essential (primary) hypertension: Secondary | ICD-10-CM

## 2014-10-25 DIAGNOSIS — I255 Ischemic cardiomyopathy: Secondary | ICD-10-CM | POA: Insufficient documentation

## 2014-10-25 LAB — LIPID PANEL
CHOLESTEROL: 123 mg/dL (ref 0–200)
HDL: 43 mg/dL (ref >39)
LDL Cholesterol: 60 mg/dL (ref 0–99)
Total CHOL/HDL Ratio: 2.9 Ratio
Triglycerides: 98 mg/dL (ref <150)
VLDL: 20 mg/dL (ref 0–40)

## 2014-10-25 LAB — COMPREHENSIVE METABOLIC PANEL
ALT: 12 U/L (ref 0–53)
AST: 17 U/L (ref 0–37)
Albumin: 3.6 g/dL (ref 3.5–5.2)
Alkaline Phosphatase: 67 U/L (ref 39–117)
BUN: 37 mg/dL — ABNORMAL HIGH (ref 6–23)
CO2: 24 meq/L (ref 19–32)
Calcium: 8.9 mg/dL (ref 8.4–10.5)
Chloride: 109 mEq/L (ref 96–112)
Creat: 2.42 mg/dL — ABNORMAL HIGH (ref 0.50–1.35)
GLUCOSE: 122 mg/dL — AB (ref 70–99)
POTASSIUM: 4.6 meq/L (ref 3.5–5.3)
SODIUM: 143 meq/L (ref 135–145)
TOTAL PROTEIN: 7 g/dL (ref 6.0–8.3)
Total Bilirubin: 0.6 mg/dL (ref 0.2–1.2)

## 2014-10-25 LAB — CBC
HCT: 36.5 % — ABNORMAL LOW (ref 39.0–52.0)
HEMOGLOBIN: 12.1 g/dL — AB (ref 13.0–17.0)
MCH: 26.9 pg (ref 26.0–34.0)
MCHC: 33.2 g/dL (ref 30.0–36.0)
MCV: 81.1 fL (ref 78.0–100.0)
MPV: 9.5 fL (ref 9.4–12.4)
Platelets: 169 10*3/uL (ref 150–400)
RBC: 4.5 MIL/uL (ref 4.22–5.81)
RDW: 16.2 % — ABNORMAL HIGH (ref 11.5–15.5)
WBC: 5.9 10*3/uL (ref 4.0–10.5)

## 2014-10-25 LAB — TSH: TSH: 2.037 u[IU]/mL (ref 0.350–4.500)

## 2014-10-25 MED ORDER — HYDRALAZINE HCL 50 MG PO TABS: ORAL_TABLET | ORAL | Status: DC

## 2014-10-25 NOTE — Patient Instructions (Signed)
Your physician has requested that you have an echocardiogram. Echocardiography is a painless test that uses sound waves to create images of your heart. It provides your doctor with information about the size and shape of your heart and how well your heart's chambers and valves are working. This procedure takes approximately one hour. There are no restrictions for this procedure.   Your physician recommends that you return for lab work in: today.  Your physician has recommended you make the following change in your medication: increase the hydralazine to 1 & 1/2 tablet 3 times a day. A new prescription has been sent to your pharmacy to reflect the change.  Your physician wants you to follow-up in: 6 months or sooner if needed. You will receive a reminder letter in the mail two months in advance. If you don't receive a letter, please call our office to schedule the follow-up appointment.

## 2014-10-25 NOTE — Progress Notes (Signed)
Patient ID: Francisco Shoulder., male   DOB: 12/31/1945, 68 y.o.   MRN: [id number]    HPI: Francisco Dealba. is a 68 y.o. male  African American gentleman presents for 6 month followup followup cardiology evaluation.   Francisco [name] has a history of an  ischemic cardiomyopathy as well as renal insufficiency. In December 2012 he presented to [facility] with respiratory distress, and ruled in for a non-ST segment elevation myocardial infarction.  He underwent  CABG surgery x5 by Dr. Roxan Hockey the LIMA to the LAD, SVG to the diagonal, sequential SVG to the ramus intermediate and OM1 vessel, and SVG to the PDA. Initial ejection fraction was 25-30%. He wore a life vest for prophylaxis against sudden arrhythmic cardiac death. Subsequent LV function improved to 35-40% which was noted on his echo in April 2013 in his life this was discontinued. He was followed by Dr. Salem Senate for his progressive renal insufficiency and since Dr. Cherlyn Cushing retirement he sees Dr. Joelyn Oms.  I last saw Francisco [name] in December 2014.  Over the past 6 months, he has continued to be fairly stable. He is unaware of any palpitations. He denies any PND or orthopnea. He denies chest pressure.  He states that he saw Dr. Joelyn Oms approximately 5 months ago.  Laboratory that time showed a creatinine of 2.22 and a BUN of 42.  In February 2014, his creatinine had been 2.35.  Presently, he has been on hydralazine 50 mg 3 times a day, isosorbide mononitrate 30 mg daily, losartan 50 mg daily, Lasix 40 g in the morning, carvedilol 25 g twice a day, and aspirin.  Past Medical History  Diagnosis Date  .  requiring BiPap 11/21/2011  . Acute pulmonary edema 11/21/2011  . Tachycardia 11/21/2011  . NSTEMI (non-ST elevated myocardial infarction) 11/21/2011  . HTN (hypertension) 03/11/12    ECHO-EF-35-45%  . Hyperlipidemia 11/27/11    NUCLEAR STRESS TEST-decreased septal and inferior wall  motion and contractility. Remote apical and inferior wall infarct. EF  24%  . Coronary artery disease     Past Surgical History  Procedure Laterality Date  . Coronary artery bypass graft  12/03/2011    Procedure: CORONARY ARTERY BYPASS GRAFTING (CABG);  Surgeon: Melrose Nakayama, MD;  Location: [place];  Service: Open Heart Surgery;  Laterality: N/A;  Times 5 on  pump.  using right internal mammary artery and bilateral greater saphenous veins.   . Cardiac catheterization  01/13    Allergies  Allergen Reactions  . Regadenoson Other (See Comments)    Unresponsive, Abdominal Pain    Current Outpatient Prescriptions  Medication Sig Dispense Refill  . aspirin EC 81 MG tablet Take 81 mg by mouth daily.    . carvedilol (COREG) 25 MG tablet Take 1 tablet (25 mg total) by mouth 2 (two) times daily with a meal. 60 tablet 2  . hydrALAZINE (APRESOLINE) 50 MG tablet TAKE 1 TABLET (50 MG TOTAL) BY MOUTH 3 (THREE) TIMES DAILY. 90 tablet 1  . isosorbide mononitrate (IMDUR) 30 MG 24 hr tablet Take 1 tablet (30 mg total) by mouth daily. NEEDS APPOINTMENT 30 tablet 1  . losartan (COZAAR) 50 MG tablet TAKE 1 TABLET EVERY DAY 30 tablet 7  . atorvastatin (LIPITOR) 40 MG tablet Take 40 mg by mouth daily.  12  . furosemide (LASIX) 40 MG tablet Take 40 mg by mouth daily. IN THE MORNING     No current facility-administered medications for this visit.    Social  he is married has 3 children 3 grandchildren. He does not routinely exercise. There is no recent tobacco or alcohol use.  ROS General: Negative; No fevers, chills, or night sweats;  HEENT: Negative; No changes in vision or hearing, sinus congestion, difficulty swallowing Pulmonary: Negative; No cough, wheezing, shortness of breath, hemoptysis Cardiovascular: Negative; No chest pain, presyncope, syncope, palpitations GI: Negative; No nausea, vomiting, diarrhea, or abdominal pain GU: Negative; No dysuria, hematuria, or difficulty voiding Musculoskeletal: Negative; no myalgias, joint pain, or  weakness Hematologic/Oncology: Negative; no easy bruising, bleeding Endocrine: Negative; no heat/cold intolerance; no diabetes Neuro: Negative; no changes in balance, headaches Skin: Negative; No rashes or skin lesions Psychiatric: Negative; No behavioral problems, depression Sleep: Negative; No snoring, daytime sleepiness, hypersomnolence, bruxism, restless legs, hypnogognic hallucinations, no cataplexy Other comprehensive 14 point system review is negative.   PE BP 144/82 mmHg  Pulse 66  Ht 5\' 5"  (1.651 m)  Wt 185 lb 6.4 oz (84.097 kg)  BMI 30.85 kg/m2  General: Alert, oriented, no distress.  HEENT: Normocephalic, atraumatic. Pupils round and reactive; sclera anicteric;  Nose without nasal septal hypertrophy Mouth/Parynx benign; Mallinpatti scale 3 Neck: No JVD, no carotid bruits with normal carotid upstroke Lungs: clear to ausculatation and percussion; no wheezing or rales Heart: RR , with an occasional ectopic complex. s1 s2 normal A999333 systolic murmur. There is no S3 gallop.  No diastolic murmur.  No rubs thrills or heaves,  Abdomen: mildly protuberant the oft, nontender; no hepatosplenomehaly, BS+; abdominal aorta nontender and not dilated by palpation. Back: No CVA tenderness Pulses 2+ Extremities: Trace bilateral ankle swelling; no clubbinbg cyanosis, Homan's sign negative  Neurologic: grossly nonfocal Psychologic: Normal affect and mood   ECG (independently read by me): Normal sinus rhythm with sinus arrhythmia and PACs.  Previously noted anterolateral T-wave abnormality.  QTc interval 471 ms.  PR interval is normal at 148 ms.  December 2014 ECG: Sinus rhythm at 62 beats per minute with left bundle branch block. PA-C. Mild sinus arrhythmia  LABS:  BMET    Component Value Date/Time   NA 138 12/09/2011 0605   K 4.4 12/09/2011 0605   CL 110 12/09/2011 0605   CO2 19 12/09/2011 0605   GLUCOSE 108* 12/09/2011 0605   BUN 49* 12/09/2011 0605   CREATININE 2.69*  12/09/2011 0605   CALCIUM 8.0* 12/09/2011 0605   GFRNONAA 23* 12/09/2011 0605   GFRAA 27* 12/09/2011 0605     Hepatic Function Panel     Component Value Date/Time   PROT 5.0* 12/06/2011 0416   ALBUMIN 2.1* 12/06/2011 0416   AST 24 12/06/2011 0416   ALT 16 12/06/2011 0416   ALKPHOS 51 12/06/2011 0416   BILITOT 0.4 12/06/2011 0416   BILIDIR <0.1 11/21/2011 1142   IBILI NOT CALCULATED 11/21/2011 1142     CBC    Component Value Date/Time   WBC 9.3 12/09/2011 0605   RBC 2.97* 12/09/2011 0605   HGB 8.4* 12/09/2011 0605   HCT 25.1* 12/09/2011 0605   PLT 204 12/09/2011 0605   MCV 84.5 12/09/2011 0605   MCH 28.3 12/09/2011 0605   MCHC 33.5 12/09/2011 0605   RDW 15.8* 12/09/2011 0605   LYMPHSABS 1.4 12/02/2011 0558   MONOABS 1.2* 12/02/2011 0558   EOSABS 0.2 12/02/2011 0558   BASOSABS 0.0 12/02/2011 0558     BNP    Component Value Date/Time   PROBNP 2538.0* 11/26/2011 0542    Lipid Panel     Component Value Date/Time   CHOL 263* 11/22/2011  0346   TRIG 55 11/22/2011 0346   HDL 72 11/22/2011 0346   CHOLHDL 3.7 11/22/2011 0346   VLDL 11 11/22/2011 0346   LDLCALC 180* 11/22/2011 0346     RADIOLOGY: No results found.    ASSESSMENT AND PLAN:  Francisco [name] is a 68 year old African-American gentleman with a history of an ischemic cardiomyopathy as well as renal insufficiency. Initially, he had an ejection fraction in the 25% range. This had improved to 35-40% in April 2013 in June 2014 ejection fraction was reported at 55%.  He has had creatinines in the 2.2-2.3 range.  He has been able to tolerate low dose ARB therapy.  Presently, he denies any symptoms suggestive of acute CHF.  He has not had an echocardiographic assessment  since June 2014.  Presently, his blood pressure is elevated today and was 150/84 when taken by me and was 144/82 when taken by the nurse.  I am recommending slight additional titration of his hydralazine to 75 mg 3 times a day and he will continue with  his current dose of nitrate therapy.  He is fasting this morning and I will check a complete set of laboratory.  He does have sinus arrhythmia with PACs and presently is on carvedilol 25 mg twice a day I will and I will not further titrate this dose today.  He has been tolerating atorvastatin for hyperlipidemia.  Previously he had significant LDL elevation at 180 at the time of his CABG revascularization surgery.  He is not having any anginal symptoms and is now 3 years since his CABG revascularization surgery.  I am scheduling him for an 18 month follow-up echo Doppler assessment to reassess LV systolic and diastolic function as well as valvular architecture.  I will see him in 6 months for cardiology reevaluation or sooner if problems arise.  Time spent: 25 minutes  Troy Sine, MD, [facility]  10/25/2014 9:29 AM

## 2014-10-31 ENCOUNTER — Other Ambulatory Visit: Payer: Self-pay | Admitting: Cardiovascular Disease

## 2014-10-31 NOTE — Telephone Encounter (Signed)
Rx was sent to pharmacy electronically. 

## 2014-11-01 ENCOUNTER — Encounter (HOSPITAL_COMMUNITY): Payer: Self-pay | Admitting: Internal Medicine

## 2014-11-12 ENCOUNTER — Other Ambulatory Visit: Payer: Self-pay | Admitting: Cardiovascular Disease

## 2014-11-13 NOTE — Telephone Encounter (Signed)
Isosorbide refilled on 10/31/14

## 2014-11-14 ENCOUNTER — Encounter: Payer: Self-pay | Admitting: *Deleted

## 2014-11-22 ENCOUNTER — Other Ambulatory Visit: Payer: Self-pay | Admitting: Cardiovascular Disease

## 2014-11-26 ENCOUNTER — Ambulatory Visit (HOSPITAL_COMMUNITY): Payer: Medicare Other

## 2014-12-11 ENCOUNTER — Other Ambulatory Visit: Payer: Self-pay | Admitting: Cardiovascular Disease

## 2014-12-11 NOTE — Telephone Encounter (Signed)
Rx(s) sent to pharmacy electronically.  

## 2014-12-15 ENCOUNTER — Other Ambulatory Visit: Payer: Self-pay | Admitting: Cardiovascular Disease

## 2014-12-16 NOTE — Telephone Encounter (Signed)
Hydralazine, carvedilol, isosorbide all previously refilled.

## 2014-12-19 ENCOUNTER — Ambulatory Visit (HOSPITAL_COMMUNITY)
Admission: RE | Admit: 2014-12-19 | Discharge: 2014-12-19 | Disposition: A | Discharge status: Home / Self Care | Payer: Medicare Other | Source: Ambulatory Visit | Attending: Cardiovascular Disease | Admitting: Cardiovascular Disease

## 2014-12-19 DIAGNOSIS — E785 Hyperlipidemia, unspecified: Secondary | ICD-10-CM | POA: Insufficient documentation

## 2014-12-19 DIAGNOSIS — I252 Old myocardial infarction: Secondary | ICD-10-CM | POA: Insufficient documentation

## 2014-12-19 DIAGNOSIS — Z951 Presence of aortocoronary bypass graft: Secondary | ICD-10-CM | POA: Insufficient documentation

## 2014-12-19 DIAGNOSIS — I2510 Atherosclerotic heart disease of native coronary artery without angina pectoris: Secondary | ICD-10-CM | POA: Diagnosis not present

## 2014-12-19 DIAGNOSIS — I255 Ischemic cardiomyopathy: Secondary | ICD-10-CM | POA: Insufficient documentation

## 2014-12-19 DIAGNOSIS — I081 Rheumatic disorders of both mitral and tricuspid valves: Secondary | ICD-10-CM | POA: Insufficient documentation

## 2014-12-19 DIAGNOSIS — I10 Essential (primary) hypertension: Secondary | ICD-10-CM | POA: Insufficient documentation

## 2014-12-19 NOTE — Progress Notes (Signed)
2D Echocardiogram Complete.  12/19/2014   Marveen Donlon [place], RDCS

## 2014-12-25 ENCOUNTER — Encounter: Payer: Self-pay | Admitting: *Deleted

## 2015-08-01 ENCOUNTER — Ambulatory Visit: Payer: Medicare Other | Admitting: Cardiovascular Disease

## 2015-11-25 ENCOUNTER — Other Ambulatory Visit: Payer: Self-pay | Admitting: Cardiovascular Disease

## 2015-12-03 ENCOUNTER — Telehealth: Payer: Self-pay | Admitting: Cardiovascular Disease

## 2015-12-03 ENCOUNTER — Other Ambulatory Visit: Payer: Self-pay | Admitting: Cardiovascular Disease

## 2015-12-03 MED ORDER — HYDRALAZINE HCL 50 MG PO TABS: 75.0000 mg | ORAL_TABLET | Freq: Three times a day (TID) | ORAL | Status: DC

## 2015-12-03 MED ORDER — FUROSEMIDE 40 MG PO TABS: 40.0000 mg | ORAL_TABLET | Freq: Every day | ORAL | Status: DC

## 2015-12-03 NOTE — Telephone Encounter (Signed)
°*  STAT* If patient is at the pharmacy, call can be transferred to refill team.   1. Which medications need to be refilled? (please list name of each medication and dose if known) Furosemide, Hydralazine   2. Which pharmacy/location (including street and city if local pharmacy) is [facility] sent to[facility] [address] [facility] [place]   3. Do they need a 30 day or 90 day supply? [place]

## 2015-12-03 NOTE — Telephone Encounter (Signed)
Refill sent to the pharmacy electronically.  

## 2015-12-18 ENCOUNTER — Other Ambulatory Visit: Payer: Self-pay | Admitting: Cardiovascular Disease

## 2015-12-18 NOTE — Telephone Encounter (Signed)
Rx request sent to pharmacy.  

## 2016-01-13 ENCOUNTER — Other Ambulatory Visit: Payer: Self-pay | Admitting: Cardiovascular Disease

## 2016-01-14 ENCOUNTER — Telehealth: Payer: Self-pay | Admitting: Cardiovascular Disease

## 2016-01-14 MED ORDER — CARVEDILOL 25 MG PO TABS: 25.0000 mg | ORAL_TABLET | Freq: Two times a day (BID) | ORAL | Status: DC

## 2016-01-14 NOTE — Telephone Encounter (Signed)
New message   *STAT* If patient is at the pharmacy, call can be transferred to refill team.   1. Which medications need to be refilled? (please list name of each medication and dose if known) carvedilol (COREG) 25 MG tablet  2. Which pharmacy/location (including street and city if local pharmacy) is [facility] sent to[facility] [place]  3. Do they need a 30 day or 90 day supply? 90 day supply  Pt states that he has brought this to the office twice and nothing has been done. Please assist

## 2016-01-14 NOTE — Telephone Encounter (Signed)
Refills sent w/ quantity dispense limitation (pending overdue appt).

## 2016-02-03 ENCOUNTER — Ambulatory Visit (INDEPENDENT_AMBULATORY_CARE_PROVIDER_SITE_OTHER): Payer: Medicare Other | Admitting: Cardiovascular Disease

## 2016-02-03 ENCOUNTER — Encounter: Payer: Self-pay | Admitting: Cardiovascular Disease

## 2016-02-03 VITALS — BP 118/70 | HR 65 | Ht 65.0 in | Wt 176.4 lb

## 2016-02-03 DIAGNOSIS — N184 Chronic kidney disease, stage 4 (severe): Secondary | ICD-10-CM | POA: Insufficient documentation

## 2016-02-03 DIAGNOSIS — Z79899 Other long term (current) drug therapy: Secondary | ICD-10-CM | POA: Diagnosis not present

## 2016-02-03 DIAGNOSIS — I255 Ischemic cardiomyopathy: Secondary | ICD-10-CM | POA: Diagnosis not present

## 2016-02-03 DIAGNOSIS — I447 Left bundle-branch block, unspecified: Secondary | ICD-10-CM

## 2016-02-03 DIAGNOSIS — I10 Essential (primary) hypertension: Secondary | ICD-10-CM

## 2016-02-03 DIAGNOSIS — I2510 Atherosclerotic heart disease of native coronary artery without angina pectoris: Secondary | ICD-10-CM

## 2016-02-03 DIAGNOSIS — Z951 Presence of aortocoronary bypass graft: Secondary | ICD-10-CM

## 2016-02-03 LAB — LIPID PANEL
CHOLESTEROL: 124 mg/dL — AB (ref 125–200)
HDL: 33 mg/dL — ABNORMAL LOW (ref >=40)
LDL CALC: 63 mg/dL (ref <130)
Total CHOL/HDL Ratio: 3.8 Ratio (ref <=5.0)
Triglycerides: 138 mg/dL (ref <150)
VLDL: 28 mg/dL (ref <30)

## 2016-02-03 LAB — COMPREHENSIVE METABOLIC PANEL
ALT: 11 U/L (ref 9–46)
AST: 20 U/L (ref 10–35)
Albumin: 3.8 g/dL (ref 3.6–5.1)
Alkaline Phosphatase: 69 U/L (ref 40–115)
BUN: 38 mg/dL — AB (ref 7–25)
CHLORIDE: 107 mmol/L (ref 98–110)
CO2: 21 mmol/L (ref 20–31)
Calcium: 8.8 mg/dL (ref 8.6–10.3)
Creat: 1.88 mg/dL — ABNORMAL HIGH (ref 0.70–1.18)
GLUCOSE: 91 mg/dL (ref 65–99)
POTASSIUM: 4.6 mmol/L (ref 3.5–5.3)
Sodium: 139 mmol/L (ref 135–146)
TOTAL PROTEIN: 7.2 g/dL (ref 6.1–8.1)
Total Bilirubin: 0.9 mg/dL (ref 0.2–1.2)

## 2016-02-03 LAB — CBC
HCT: 36.2 % — ABNORMAL LOW (ref 39.0–52.0)
Hemoglobin: 11.9 g/dL — ABNORMAL LOW (ref 13.0–17.0)
MCH: 26.9 pg (ref 26.0–34.0)
MCHC: 32.9 g/dL (ref 30.0–36.0)
MCV: 81.7 fL (ref 78.0–100.0)
MPV: 10.2 fL (ref 8.6–12.4)
PLATELETS: 173 10*3/uL (ref 150–400)
RBC: 4.43 MIL/uL (ref 4.22–5.81)
RDW: 16.1 % — AB (ref 11.5–15.5)
WBC: 4.9 10*3/uL (ref 4.0–10.5)

## 2016-02-03 LAB — TSH: TSH: 1.41 m[IU]/L (ref 0.40–4.50)

## 2016-02-03 NOTE — Patient Instructions (Addendum)
Your physician recommends that you return for lab work fasting.   Your physician wants you to follow-up in: 1 year or sooner if needed. You will receive a reminder letter in the mail two months in advance. If you don't receive a letter, please call our office to schedule the follow-up appointment.  If you need a refill on your cardiac medications before your next appointment, please call your pharmacy.    

## 2016-02-03 NOTE — Progress Notes (Signed)
Patient ID: Francisco Shoulder., male   DOB: November 19, 1946, 70 y.o.   MRN: [id number]     Primary MD: Dr. Edrick Oh  HPI: Francisco Schweigert. is a 70 y.o. male  African American gentleman presents for 16 month followup followup cardiology evaluation.   Francisco [name] has a history of an  ischemic cardiomyopathy and renal insufficiency. In December 2012 he presented to [facility] with respiratory distress, and ruled in for a non-ST segment elevation myocardial infarction.  He underwent  CABG surgery x5 by Dr. Roxan Hockey the LIMA to the LAD, SVG to the diagonal, sequential SVG to the ramus intermediate and OM1 vessel, and SVG to the PDA. Initial ejection fraction was 25-30%. He wore a life vest for prophylaxis against sudden arrhythmic cardiac death. Subsequent LV function in April 2013 was improved to 35-40% and his life this was discontinued. He was followed by Dr. Salem Senate for his progressive renal insufficiency and since Dr. Cherlyn Cushing retirement he sees Dr. Joelyn Oms.  In December 2015, his creatinine was 2.42.  Since I saw him in December 2015, a follow-up echo Doppler study in January 2016 showed slight additional improvement in LV function with an EF of 45-50%.  1 diastolic dysfunction.  He had septal wall motion changes compatible with his left bundle branch block.  There was mild-to-moderate Francisco, mild/moderate LA dilatation and mild RA and RV dilatation.  PA pressure estimate was 33 mm.  Francisco [name] denies recent chest pain.  He's unaware of palpitations.  He does not exercise.  He denies PND, orthopnea.  He presents for evaluation.   Past Medical History  Diagnosis Date  .  requiring BiPap 11/21/2011  . Acute pulmonary edema ([place]) 11/21/2011  . Tachycardia 11/21/2011  . NSTEMI (non-ST elevated myocardial infarction) ([place]) 11/21/2011  . HTN (hypertension) 03/11/12    ECHO-EF-35-45%  . Hyperlipidemia 11/27/11    NUCLEAR STRESS TEST-decreased septal and inferior wall  motion and contractility. Remote apical and  inferior wall infarct. EF 24%  . Coronary artery disease     Past Surgical History  Procedure Laterality Date  . Coronary artery bypass graft  12/03/2011    Procedure: CORONARY ARTERY BYPASS GRAFTING (CABG);  Surgeon: Melrose Nakayama, MD;  Location: [facility];  Service: Open Heart Surgery;  Laterality: N/A;  Times 5 on  pump.  using right internal mammary artery and bilateral greater saphenous veins.   . Cardiac catheterization  01/13  . Left heart catheterization with coronary angiogram N/A 11/30/2011    Procedure: LEFT HEART CATHETERIZATION WITH CORONARY ANGIOGRAM;  Surgeon: Pixie Casino, MD;  Location: [facility] CATH LAB;  Service: Cardiovascular;  Laterality: N/A;    Allergies  Allergen Reactions  . Regadenoson Other (See Comments)    Unresponsive, Abdominal Pain    Current Outpatient Prescriptions  Medication Sig Dispense Refill  . aspirin 325 MG tablet Take 325 mg by mouth daily.    Marland Kitchen atorvastatin (LIPITOR) 40 MG tablet Take 40 mg by mouth daily.  12  . carvedilol (COREG) 25 MG tablet Take 1 tablet (25 mg total) by mouth 2 (two) times daily with a meal. Keep appt for further refills. 60 tablet 0  . furosemide (LASIX) 40 MG tablet Take 1 tablet (40 mg total) by mouth daily. IN THE MORNING 90 tablet 0  . hydrALAZINE (APRESOLINE) 50 MG tablet Take 1.5 tablets (75 mg total) by mouth 3 (three) times daily. (Patient taking differently: Take 50 mg by mouth 3 (three) times daily. ) 135 tablet  0  . isosorbide mononitrate (IMDUR) 30 MG 24 hr tablet TAKE 1 TABLET (30 MG TOTAL) BY MOUTH DAILY. 30 tablet 2  . losartan (COZAAR) 50 MG tablet TAKE 1 TABLET EVERY DAY 30 tablet 7   No current facility-administered medications for this visit.    Social he is married has 3 children 3 grandchildren. He does not routinely exercise. There is no recent tobacco or alcohol use.  ROS General: Negative; No fevers, chills, or night sweats;  HEENT: Negative; No changes in vision or hearing, sinus congestion,  difficulty swallowing Pulmonary: Negative; No cough, wheezing, shortness of breath, hemoptysis Cardiovascular: Negative; No chest pain, presyncope, syncope, palpitations GI: Negative; No nausea, vomiting, diarrhea, or abdominal pain GU: Negative; No dysuria, hematuria, or difficulty voiding Musculoskeletal: Negative; no myalgias, joint pain, or weakness Hematologic/Oncology: Negative; no easy bruising, bleeding Endocrine: Negative; no heat/cold intolerance; no diabetes Neuro: Negative; no changes in balance, headaches Skin: Negative; No rashes or skin lesions Psychiatric: Negative; No behavioral problems, depression Sleep: Negative; No snoring, daytime sleepiness, hypersomnolence, bruxism, restless legs, hypnogognic hallucinations, no cataplexy Other comprehensive 14 point system review is negative.   PE BP 118/70 mmHg  Pulse 65  Ht 5' 5" (1.651 m)  Wt 176 lb 6.4 oz (80.015 kg)  BMI 29.35 kg/m2   Wt Readings from Last 3 Encounters:  02/03/16 176 lb 6.4 oz (80.015 kg)  10/25/14 185 lb 6.4 oz (84.097 kg)  10/27/13 194 lb 9.6 oz (88.27 kg)   General: Alert, oriented, no distress.  HEENT: Normocephalic, atraumatic. Pupils round and reactive; sclera anicteric;  Nose without nasal septal hypertrophy Mouth/Parynx benign; Mallinpatti scale 3 Neck: No JVD, no carotid bruits with normal carotid upstroke Lungs: clear to ausculatation and percussion; no wheezing or rales Heart: RR , with an occasional ectopic complex. s1 s2 normal 4-1/6 systolic murmur. There is no S3 gallop.  No diastolic murmur.  No rubs thrills or heaves,  Abdomen: mildly protuberant, nontender; no hepatosplenomehaly, BS+; abdominal aorta nontender and not dilated by palpation. Back: No CVA tenderness Pulses 2+ Extremities: Trace bilateral ankle swelling; no clubbinbg cyanosis, Homan's sign negative  Neurologic: grossly nonfocal Psychologic: Normal affect and mood   ECG (independently read by me): Sinus rhythm at 65  bpm with occasional PAC.  Left bundle branch block with repolarization changes.  December 2015 ECG (independently read by me): Normal sinus rhythm with sinus arrhythmia and PACs.  Previously noted anterolateral T-wave abnormality.  QTc interval 471 ms.  PR interval is normal at 148 ms.  December 2014 ECG: Sinus rhythm at 62 beats per minute with left bundle branch block. PA-C. Mild sinus arrhythmia  LABS: BMP Latest Ref Rng 10/25/2014 12/09/2011 12/08/2011  Glucose 70 - 99 mg/dL 122(H) 108(H) 114(H)  BUN 6 - 23 mg/dL 37(H) 49(H) 55(H)  Creatinine 0.50 - 1.35 mg/dL 2.42(H) 2.69(H) 3.04(H)  Sodium 135 - 145 mEq/L 143 138 139  Potassium 3.5 - 5.3 mEq/L 4.6 4.4 4.3  Chloride 96 - 112 mEq/L 109 110 110  CO2 19 - 32 mEq/L _0 Calcium 8.4 - 10.5 mg/dL 8.9 8.0(L) 7.4(L)    Hepatic Function Latest Ref Rng 10/25/2014 12/06/2011 11/29/2011  Total Protein 6.0 - 8.3 g/dL 7.0 5.0(L) -  Albumin 3.5 - 5.2 g/dL 3.6 2.1(L) 2.4(L)  AST 0 - 37 U/L 17 24 -  ALT 0 - 53 U/L 12 16 -  Alk Phosphatase 39 - 117 U/L 67 51 -  Total Bilirubin 0.2 - 1.2 mg/dL 0.6 0.4 -  CBC Latest Ref Rng 10/25/2014 12/09/2011 12/08/2011  WBC 4.0 - 10.5 K/uL 5.9 9.3 7.2  Hemoglobin 13.0 - 17.0 g/dL 12.1(L) 8.4(L) 7.5(L)  Hematocrit 39.0 - 52.0 % 36.5(L) 25.1(L) 22.6(L)  Platelets 150 - 400 K/uL 169 204 176    Lab Results  Component Value Date   MCV 81.1 10/25/2014   MCV 84.5 12/09/2011   MCV 83.7 12/08/2011    Lab Results  Component Value Date   TSH 2.037 10/25/2014   Lab Results  Component Value Date   HGBA1C 6.2* 12/03/2011   Lipid Panel     Component Value Date/Time   CHOL 123 10/25/2014 1024   TRIG 98 10/25/2014 1024   HDL 43 10/25/2014 1024   CHOLHDL 2.9 10/25/2014 1024   VLDL 20 10/25/2014 1024   LDLCALC 60 10/25/2014 1024    RADIOLOGY: No results found.    ASSESSMENT AND PLAN:  Francisco [name] is a 70 year old African-American gentleman with a history of an ischemic cardiomyopathy as well as renal  insufficiency. Initially, he had an ejection fraction in the 25% range. This had improved to 35-40% in April 2013, and his echo last year showed further improvement with an EF of 45-50%.  He has had creatinines in the 2.2-2.3 range.  He has been able to tolerate low dose ARB therapy.  Presently, he denies any symptoms suggestive of acute CHF and his PE suggests euvolemia.  BP today is stable at 118/70 on losartan 50 mg, hydralazine 50 mg every 8 hours, carvedilol 25 mg twice a day, and isosorbide 30 mg.  He has a history of hyperlipidemia on atorvastatin 40 mg.  He has been taking aspirin 325 mg and I recommended he reduce this to 81 mg.  He has not had recent laboratory.  He is fasting today.  I will send him down to the laboratory so that blood work and be obtained today.  Medication adjustment will be done if necessary.  He is not having anginal symptoms.  His ECG remains stable with left bundle branch block with occasional PACs.  I will see him in one year for cardiology reevaluation.  Time spent: 25 minutes  Troy Sine, MD, [facility]  02/03/2016 8:51 AM

## 2016-02-11 ENCOUNTER — Other Ambulatory Visit: Payer: Self-pay | Admitting: Cardiovascular Disease

## 2016-02-11 NOTE — Telephone Encounter (Signed)
Rx request sent to pharmacy.  

## 2016-02-12 ENCOUNTER — Other Ambulatory Visit: Payer: Self-pay | Admitting: Cardiovascular Disease

## 2016-02-12 NOTE — Telephone Encounter (Signed)
Rx request sent to pharmacy.  

## 2016-02-24 ENCOUNTER — Telehealth: Payer: Self-pay | Admitting: *Deleted

## 2016-02-24 NOTE — Telephone Encounter (Signed)
Informed wife of lab results and recommendations.

## 2016-02-24 NOTE — Telephone Encounter (Signed)
-----   Message from Troy Sine, MD sent at 02/22/2016  3:10 PM EDT ----- Renal fxn better; Cr still elevated; stable anemia

## 2016-03-18 ENCOUNTER — Other Ambulatory Visit: Payer: Self-pay | Admitting: Cardiovascular Disease

## 2016-03-18 NOTE — Telephone Encounter (Signed)
Rx(s) sent to pharmacy electronically.  

## 2016-04-06 ENCOUNTER — Other Ambulatory Visit: Payer: Self-pay | Admitting: Cardiovascular Disease

## 2016-04-06 MED ORDER — ISOSORBIDE MONONITRATE ER 30 MG PO TB24: 30.0000 mg | ORAL_TABLET | Freq: Every day | ORAL | Status: DC

## 2016-04-06 NOTE — Telephone Encounter (Signed)
Rx request sent to pharmacy.  

## 2016-04-18 ENCOUNTER — Other Ambulatory Visit: Payer: Self-pay | Admitting: Cardiovascular Disease

## 2016-05-29 ENCOUNTER — Other Ambulatory Visit: Payer: Self-pay | Admitting: Cardiovascular Disease

## 2016-05-29 NOTE — Telephone Encounter (Signed)
Rx(s) sent to pharmacy electronically.  

## 2017-03-14 ENCOUNTER — Other Ambulatory Visit: Payer: Self-pay | Admitting: Cardiovascular Disease

## 2017-03-15 NOTE — Telephone Encounter (Signed)
Rx request sent to pharmacy.  

## 2017-03-25 ENCOUNTER — Encounter: Payer: Self-pay | Admitting: Cardiovascular Disease

## 2017-04-13 ENCOUNTER — Other Ambulatory Visit: Payer: Self-pay | Admitting: Cardiovascular Disease

## 2017-04-13 NOTE — Telephone Encounter (Signed)
REFILL 

## 2017-04-24 ENCOUNTER — Other Ambulatory Visit: Payer: Self-pay | Admitting: Cardiovascular Disease

## 2017-05-29 ENCOUNTER — Other Ambulatory Visit: Payer: Self-pay | Admitting: Cardiovascular Disease

## 2017-06-25 ENCOUNTER — Other Ambulatory Visit: Payer: Self-pay | Admitting: Cardiovascular Disease

## 2017-07-08 ENCOUNTER — Other Ambulatory Visit: Payer: Self-pay

## 2017-07-08 MED ORDER — ISOSORBIDE MONONITRATE ER 30 MG PO TB24: 30.0000 mg | ORAL_TABLET | Freq: Every day | ORAL | 0 refills | Status: DC

## 2017-07-18 ENCOUNTER — Other Ambulatory Visit: Payer: Self-pay | Admitting: Cardiovascular Disease

## 2017-07-19 NOTE — Telephone Encounter (Signed)
REFILL 

## 2017-07-29 ENCOUNTER — Other Ambulatory Visit: Payer: Self-pay | Admitting: Cardiovascular Disease

## 2017-08-25 ENCOUNTER — Other Ambulatory Visit: Payer: Self-pay | Admitting: Cardiovascular Disease

## 2017-11-24 ENCOUNTER — Other Ambulatory Visit: Payer: Self-pay | Admitting: Cardiovascular Disease

## 2017-11-25 ENCOUNTER — Telehealth: Payer: Self-pay

## 2017-11-25 NOTE — Telephone Encounter (Signed)
  Called patient at the telephone numbers listed could not leave a voicemail and the other number is no longer a working number. Wanted to contact patient about scheduling an appointment and to know which pharmacy to send his medication to.

## 2017-11-26 ENCOUNTER — Other Ambulatory Visit: Payer: Self-pay | Admitting: Cardiovascular Disease

## 2017-12-31 ENCOUNTER — Other Ambulatory Visit: Payer: Self-pay | Admitting: Cardiovascular Disease

## 2018-01-03 NOTE — Telephone Encounter (Signed)
REFILL 

## 2018-03-05 ENCOUNTER — Other Ambulatory Visit: Payer: Self-pay | Admitting: Cardiovascular Disease

## 2018-04-30 ENCOUNTER — Other Ambulatory Visit: Payer: Self-pay | Admitting: Cardiovascular Disease

## 2018-05-01 ENCOUNTER — Other Ambulatory Visit: Payer: Self-pay | Admitting: Cardiovascular Disease

## 2018-05-21 ENCOUNTER — Other Ambulatory Visit: Payer: Self-pay | Admitting: Cardiovascular Disease

## 2018-06-03 ENCOUNTER — Other Ambulatory Visit: Payer: Self-pay | Admitting: Cardiovascular Disease

## 2018-06-07 ENCOUNTER — Other Ambulatory Visit: Payer: Self-pay | Admitting: Cardiovascular Disease

## 2018-06-07 MED ORDER — CARVEDILOL 25 MG PO TABS: 25.0000 mg | ORAL_TABLET | Freq: Two times a day (BID) | ORAL | 2 refills | Status: DC

## 2018-06-07 MED ORDER — HYDRALAZINE HCL 50 MG PO TABS: ORAL_TABLET | ORAL | 2 refills | Status: DC

## 2018-06-07 NOTE — Telephone Encounter (Signed)
New Message:        *STAT* If patient is at the pharmacy, call can be transferred to refill team.   1. Which medications need to be refilled? (please list name of each medication and dose if known) hydrALAZINE (APRESOLINE) 50 MG tablet  carvedilol (COREG) 25 MG tablet  2. Which pharmacy/location (including street and city if local pharmacy) is medication to be sent to?[id number] - [place], [place] [address]  3. Do they need a 30 day or 90 day supply? [place]

## 2018-06-07 NOTE — Telephone Encounter (Signed)
Rx(s) sent to pharmacy electronically.  

## 2018-08-17 ENCOUNTER — Telehealth: Payer: Self-pay | Admitting: Cardiovascular Disease

## 2018-08-17 NOTE — Telephone Encounter (Signed)
Received records from [place] [facility] on 08/17/18, Appt 09/05/18 @ 9:40AM. [place]

## 2018-09-02 ENCOUNTER — Other Ambulatory Visit: Payer: Self-pay | Admitting: Cardiovascular Disease

## 2018-09-05 ENCOUNTER — Ambulatory Visit: Payer: Medicare Other | Admitting: Cardiovascular Disease

## 2018-09-05 ENCOUNTER — Encounter: Payer: Self-pay | Admitting: Cardiovascular Disease

## 2018-09-05 VITALS — BP 166/91 | HR 58 | Ht 65.0 in | Wt 170.4 lb

## 2018-09-05 DIAGNOSIS — I255 Ischemic cardiomyopathy: Secondary | ICD-10-CM | POA: Diagnosis not present

## 2018-09-05 DIAGNOSIS — R0609 Other forms of dyspnea: Secondary | ICD-10-CM

## 2018-09-05 DIAGNOSIS — E785 Hyperlipidemia, unspecified: Secondary | ICD-10-CM

## 2018-09-05 DIAGNOSIS — N183 Chronic kidney disease, stage 3 unspecified: Secondary | ICD-10-CM

## 2018-09-05 DIAGNOSIS — Z951 Presence of aortocoronary bypass graft: Secondary | ICD-10-CM | POA: Diagnosis not present

## 2018-09-05 DIAGNOSIS — I2510 Atherosclerotic heart disease of native coronary artery without angina pectoris: Secondary | ICD-10-CM

## 2018-09-05 MED ORDER — ASPIRIN EC 81 MG PO TBEC: 81.0000 mg | DELAYED_RELEASE_TABLET | Freq: Every day | ORAL | 3 refills | Status: AC

## 2018-09-05 NOTE — Patient Instructions (Addendum)
Medication Instructions:  Decrease aspirin to 81 mg daily  If you need a refill on your cardiac medications before your next appointment, please call your pharmacy.   Lab work: Please return for FASTING labs prior to next appointment (CMET, CBC, Lipid, TSH)  Our in office lab hours are Monday-Friday 8:00-4:00, closed for lunch 12:45-1:45 pm.  No appointment needed.  If you have labs (blood work) drawn today and your tests are completely normal, you will receive your results only by: Marland Kitchen MyChart Message (if you have MyChart) OR . A paper copy in the mail If you have any lab test that is abnormal or we need to change your treatment, we will call you to review the results.  Testing/Procedures: Your physician has requested that you have a lexiscan myoview. For further information please visit [email or web address]. Please follow instruction sheet, as given.  Your physician has requested that you have an echocardiogram. Echocardiography is a painless test that uses sound waves to create images of your heart. It provides your doctor with information about the size and shape of your heart and how well your heart's chambers and valves are working. This procedure takes approximately one hour. There are no restrictions for this procedure.  Follow-Up: At [facility], you and your health needs are our priority.  As part of our continuing mission to provide you with exceptional heart care, we have created designated Provider Care Teams.  These Care Teams include your primary Cardiologist (physician) and Advanced Practice Providers (APPs -  Physician Assistants and Nurse Practitioners) who all work together to provide you with the care you need, when you need it. You will need a follow up appointment in 6 weeks-8 weeks (ok to use hold).  Please call our office 2 months in advance to schedule this appointment.  You may see Dr. Claiborne Billings or one of the following Advanced Practice Providers on your designated Care  Team: [place], [place] . Fabian Sharp, PA-C

## 2018-09-05 NOTE — Progress Notes (Signed)
Patient ID: Francisco [name]., male   DOB: [birth year], 72 y.o.   MRN: [id number]     Primary MD: Dr. Edrick Oh  HPI: Francisco [name]. is a 72 y.o. male  African American gentleman presents for 31 month followup followup cardiology evaluation.   Francisco [name] has a history of an  ischemic cardiomyopathy and renal insufficiency. In December 2012 he presented to [facility] with respiratory distress, and ruled in for a non-ST segment elevation myocardial infarction.  He underwent  CABG surgery x5 by Dr. Roxan Hockey the LIMA to the LAD, SVG to the diagonal, sequential SVG to the ramus intermediate and OM1 vessel, and SVG to the PDA. Initial ejection fraction was 25-30%. He wore a life vest for prophylaxis against sudden arrhythmic cardiac death. Subsequent LV function in April 2013 was improved to 35-40% and his life this was discontinued. He was followed by Dr. Salem Senate for his progressive renal insufficiency and since Dr. Cherlyn Cushing retirement he sees Dr. Joelyn Oms.  In December 2015, his creatinine was 2.42.  A follow-up echo Doppler study in January 2016 showed slight additional improvement in LV function with an EF of 45-50%.  1 diastolic dysfunction.  He had septal wall motion changes compatible with his left bundle branch block.  There was mild-to-moderate Francisco, mild/moderate LA dilatation and mild RA and RV dilatation.  PA pressure estimate was 33 mm.  Since I last saw him in March 2017, Francisco. [name] denies any recurrent episodes of chest pain.  He is being followed at [facility] for renal insufficiency.  He recently has noticed some slight increase in exertional shortness of breath from previously.  He denies any recent chest tightness.  His last echo Doppler study was in 2016.  He has not had a nuclear stress test since 2013.  He denies palpitations.  He denies presyncope or syncope.  He has not had recent laboratory.  He presents for evaluation.   Past Medical History:  Diagnosis Date  .  requiring BiPap  11/21/2011  . Acute pulmonary edema ([place]) 11/21/2011  . Coronary artery disease   . HTN (hypertension) 03/11/12   ECHO-EF-35-45%  . Hyperlipidemia 11/27/11   NUCLEAR STRESS TEST-decreased septal and inferior wall  motion and contractility. Remote apical and inferior wall infarct. EF 24%  . NSTEMI (non-ST elevated myocardial infarction) ([place]) 11/21/2011  . Tachycardia 11/21/2011    Past Surgical History:  Procedure Laterality Date  . CARDIAC CATHETERIZATION  01/13  . CORONARY ARTERY BYPASS GRAFT  12/03/2011   Procedure: CORONARY ARTERY BYPASS GRAFTING (CABG);  Surgeon: Melrose Nakayama, MD;  Location: [place];  Service: Open Heart Surgery;  Laterality: N/A;  Times 5 on  pump.  using right internal mammary artery and bilateral greater saphenous veins.   Marland Kitchen LEFT HEART CATHETERIZATION WITH CORONARY ANGIOGRAM N/A 11/30/2011   Procedure: LEFT HEART CATHETERIZATION WITH CORONARY ANGIOGRAM;  Surgeon: Pixie Casino, MD;  Location: [facility] CATH LAB;  Service: Cardiovascular;  Laterality: N/A;    Allergies  Allergen Reactions  . Regadenoson Other (See Comments)    Unresponsive, Abdominal Pain    Current Outpatient Medications  Medication Sig Dispense Refill  . aspirin 325 MG tablet Take 325 mg by mouth daily.    Marland Kitchen atorvastatin (LIPITOR) 40 MG tablet Take 40 mg by mouth daily.  12  . carvedilol (COREG) 25 MG tablet TAKE 1 TABLET (25 MG TOTAL) BY MOUTH 2 (TWO) TIMES DAILY WITH A MEAL. 180 tablet 0  . furosemide (LASIX) 40 MG  tablet TAKE 1 TABLET (40 MG TOTAL) BY MOUTH DAILY. NEED OFFICE VISIT 30 tablet 0  . hydrALAZINE (APRESOLINE) 50 MG tablet TAKE 1.5 TABLETS (75 MG TOTAL) BY MOUTH 3 (THREE) TIMES DAILY. 405 tablet 0  . isosorbide mononitrate (IMDUR) 30 MG 24 hr tablet TAKE 1 TABLET BY MOUTH EVERY DAY 30 tablet 3  . losartan (COZAAR) 50 MG tablet TAKE 1 TABLET EVERY DAY 30 tablet 7   No current facility-administered medications for this visit.     Social he is married has 3 children 3  grandchildren. He does not routinely exercise. There is no recent tobacco or alcohol use.  ROS General: Negative; No fevers, chills, or night sweats;  HEENT: Negative; No changes in vision or hearing, sinus congestion, difficulty swallowing Pulmonary: Negative; No cough, wheezing, shortness of breath, hemoptysis Cardiovascular: See HPI GI: Negative; No nausea, vomiting, diarrhea, or abdominal pain GU: Negative; No dysuria, hematuria, or difficulty voiding Musculoskeletal: Negative; no myalgias, joint pain, or weakness Hematologic/Oncology: Negative; no easy bruising, bleeding Endocrine: Negative; no heat/cold intolerance; no diabetes Neuro: Negative; no changes in balance, headaches Skin: Negative; No rashes or skin lesions Psychiatric: Negative; No behavioral problems, depression Sleep: Negative; No snoring, daytime sleepiness, hypersomnolence, bruxism, restless legs, hypnogognic hallucinations, no cataplexy Other comprehensive 14 point system review is negative.   PE BP (!) 166/91   Pulse (!) 58   Ht '5\' 5"'  (1.651 m)   Wt 170 lb 6.4 oz (77.3 kg)   BMI 28.36 kg/m    Repeat blood pressure by me 148/90  Wt Readings from Last 3 Encounters:  09/05/18 170 lb 6.4 oz (77.3 kg)  02/03/16 176 lb 6.4 oz (80 kg)  10/25/14 185 lb 6.4 oz (84.1 kg)   General: Alert, oriented, no distress.  Skin: normal turgor, no rashes, warm and dry HEENT: Normocephalic, atraumatic. Pupils equal round and reactive to light; sclera anicteric; extraocular muscles intact;  Nose without nasal septal hypertrophy Mouth/Parynx benign; Mallinpatti scale 3 Neck: No JVD, no carotid bruits; normal carotid upstroke Lungs: clear to ausculatation and percussion; no wheezing or rales Chest wall: without tenderness to palpitation Heart: PMI not displaced, RRR, s1 s2 normal, 1/6 systolic murmur, no diastolic murmur, no rubs, gallops, thrills, or heaves Abdomen: Small ventral hernia versus diastases recti; soft,  nontender; no hepatosplenomehaly, BS+; abdominal aorta nontender and not dilated by palpation. Back: no CVA tenderness Pulses 2+ Musculoskeletal: full range of motion, normal strength, no joint deformities Extremities: no clubbing cyanosis or edema, Homan's sign negative  Neurologic: grossly nonfocal; Cranial nerves grossly wnl Psychologic: Normal mood and affect   ECG (independently read by me): SB at 57, PAC, LBBB with repoarization  March 2017 ECG (independently read by me): Sinus rhythm at 65 bpm with occasional PAC.  Left bundle branch block with repolarization changes.  December 2015 ECG (independently read by me): Normal sinus rhythm with sinus arrhythmia and PACs.  Previously noted anterolateral T-wave abnormality.  QTc interval 471 ms.  PR interval is normal at 148 ms.  December 2014 ECG: Sinus rhythm at 62 beats per minute with left bundle branch block. PA-C. Mild sinus arrhythmia  LABS: BMP Latest Ref Rng & Units 02/03/2016 10/25/2014 12/09/2011  Glucose 65 - 99 mg/dL 91 122(H) 108(H)  BUN 7 - 25 mg/dL 38(H) 37(H) 49(H)  Creatinine 0.70 - 1.18 mg/dL 1.88(H) 2.42(H) 2.69(H)  Sodium 135 - 146 mmol/L 139 143 138  Potassium 3.5 - 5.3 mmol/L 4.6 4.6 4.4  Chloride 98 - 110 mmol/L 107 109 110  CO2 20 - 31 mmol/L '21 24 19  ' Calcium 8.6 - 10.3 mg/dL 8.8 8.9 8.0(L)    Hepatic Function Latest Ref Rng & Units 02/03/2016 10/25/2014 12/06/2011  Total Protein 6.1 - 8.1 g/dL 7.2 7.0 5.0(L)  Albumin 3.6 - 5.1 g/dL 3.8 3.6 2.1(L)  AST 10 - 35 U/L '20 17 24  ' ALT 9 - 46 U/L '11 12 16  ' Alk Phosphatase 40 - 115 U/L 69 67 51  Total Bilirubin 0.2 - 1.2 mg/dL 0.9 0.6 0.4  Bilirubin, Direct 0.0 - 0.3 mg/dL - - -    CBC Latest Ref Rng & Units 02/03/2016 10/25/2014 12/09/2011  WBC 4.0 - 10.5 K/uL 4.9 5.9 9.3  Hemoglobin 13.0 - 17.0 g/dL 11.9(L) 12.1(L) 8.4(L)  Hematocrit 39.0 - 52.0 % 36.2(L) 36.5(L) 25.1(L)  Platelets 150 - 400 K/uL 173 169 204    Lab Results  Component Value Date   MCV 81.7  02/03/2016   MCV 81.1 10/25/2014   MCV 84.5 12/09/2011    Lab Results  Component Value Date   TSH 1.41 02/03/2016   Lab Results  Component Value Date   HGBA1C 6.2 (H) 12/03/2011   Lipid Panel     Component Value Date/Time   CHOL 124 (L) 02/03/2016 0924   TRIG 138 02/03/2016 0924   HDL 33 (L) 02/03/2016 0924   CHOLHDL 3.8 02/03/2016 0924   VLDL 28 02/03/2016 0924   LDLCALC 63 02/03/2016 0924    RADIOLOGY: No results found.  IMPRESSION:  1. Coronary artery disease involving native coronary artery of native heart without angina pectoris   2. Cardiomyopathy, ischemic   3. S/P CABG x 5,  (LIMA-LAD; SEQSVG-RAMUS-OM; SVG-PD; SVG-DIAG)   4. DOE (dyspnea on exertion)      ASSESSMENT AND PLAN:  Francisco. Adcock is a 72 year old African-American gentleman with a history of an ischemic cardiomyopathy as well as renal insufficiency. Initially, he had an ejection fraction in the 25% range which improved to 35-40% in April 2013, and his last echo in 2017 showed further improvement with an EF of 45-50%.  He has had creatinines in the 2.2-2.3 range.  He is followed by [facility].  Laboratory from August 2019 showed a creatinine of 2.05.  His blood pressure today is elevated and he has been taking carvedilol 25 mg twice a day, hydralazine 75 mg 3 times a day and furosemide 40 mg in addition to losartan 50 mg and isosorbide mononitrate.  Apparently he is still taking 325 aspirin and I recommended he reduce this to 81 mg.  I am scheduling him for an echo Doppler study to reevaluate his LV function.  I am scheduling him for a Poth study to make certain his exertional shortness of breath is not ischemia mediated.  I will repeat laboratory in the fasting state.  He continues to be on atorvastatin 40 mg.  Target LDL is less than 70.  He may require further titration of his medical regimen and he also will be seeing Dr. Pearson Grippe who follows him from a renal standpoint.  I will see  him in 6 to 8 weeks for reevaluation. Time spent: 25 minutes  Troy Sine, MD, [facility]  09/05/2018 10:49 AM

## 2018-09-06 LAB — CBC
Hematocrit: 32.5 % — ABNORMAL LOW (ref 37.5–51.0)
Hemoglobin: 10.6 g/dL — ABNORMAL LOW (ref 13.0–17.7)
MCH: 27 pg (ref 26.6–33.0)
MCHC: 32.6 g/dL (ref 31.5–35.7)
MCV: 83 fL (ref 79–97)
PLATELETS: 190 10*3/uL (ref 150–450)
RBC: 3.92 x10E6/uL — ABNORMAL LOW (ref 4.14–5.80)
RDW: 16.3 % — ABNORMAL HIGH (ref 12.3–15.4)
WBC: 6.2 10*3/uL (ref 3.4–10.8)

## 2018-09-06 LAB — COMPREHENSIVE METABOLIC PANEL
A/G RATIO: 1.4 (ref 1.2–2.2)
ALT: 14 IU/L (ref 0–44)
AST: 19 IU/L (ref 0–40)
Albumin: 4 g/dL (ref 3.5–4.8)
Alkaline Phosphatase: 74 IU/L (ref 39–117)
BILIRUBIN TOTAL: 0.7 mg/dL (ref 0.0–1.2)
BUN/Creatinine Ratio: 18 (ref 10–24)
BUN: 38 mg/dL — ABNORMAL HIGH (ref 8–27)
CALCIUM: 9.1 mg/dL (ref 8.6–10.2)
CHLORIDE: 110 mmol/L — AB (ref 96–106)
CO2: 18 mmol/L — ABNORMAL LOW (ref 20–29)
Creatinine, Ser: 2.09 mg/dL — ABNORMAL HIGH (ref 0.76–1.27)
GFR, EST AFRICAN AMERICAN: 35 mL/min/{1.73_m2} — AB (ref >59)
GFR, EST NON AFRICAN AMERICAN: 31 mL/min/{1.73_m2} — AB (ref >59)
GLOBULIN, TOTAL: 2.9 g/dL (ref 1.5–4.5)
Glucose: 89 mg/dL (ref 65–99)
POTASSIUM: 5.2 mmol/L (ref 3.5–5.2)
SODIUM: 142 mmol/L (ref 134–144)
Total Protein: 6.9 g/dL (ref 6.0–8.5)

## 2018-09-06 LAB — LIPID PANEL
CHOLESTEROL TOTAL: 144 mg/dL (ref 100–199)
Chol/HDL Ratio: 2 ratio (ref 0.0–5.0)
HDL: 71 mg/dL (ref >39)
LDL Calculated: 66 mg/dL (ref 0–99)
TRIGLYCERIDES: 36 mg/dL (ref 0–149)
VLDL Cholesterol Cal: 7 mg/dL (ref 5–40)

## 2018-09-06 LAB — TSH: TSH: 1.72 u[IU]/mL (ref 0.450–4.500)

## 2018-09-07 ENCOUNTER — Encounter: Payer: Self-pay | Admitting: Cardiovascular Disease

## 2018-09-21 ENCOUNTER — Telehealth: Payer: Self-pay | Admitting: *Deleted

## 2018-09-21 NOTE — Telephone Encounter (Signed)
-----   Message from Veronia Beets sent at 09/21/2018  1:21 PM EDT ----- Regarding: Nuclear Stress Test In writing the paperwork up for the above patient it was noticed in his allergy list he is allergic to Regadenoson.  The order is for a Lexiscan test.  Is this correct?  Please advise. Thank you!

## 2018-09-22 NOTE — Telephone Encounter (Signed)
Question if this is true allergy.  We will contact the patient to obtain more information.

## 2018-09-28 NOTE — Telephone Encounter (Signed)
Attempt to call mobile and home #-no VM and not accepting calls at this time.   Will reattempt to discuss allergy

## 2018-10-04 ENCOUNTER — Telehealth (HOSPITAL_COMMUNITY): Payer: Self-pay | Admitting: *Deleted

## 2018-10-04 NOTE — Telephone Encounter (Signed)
Patients wife per DPR given detailed instructions per Myocardial Perfusion Study Information Sheet for the test on 10/10/18 at 1015. Patient notified to arrive 15 minutes early and that it is imperative to arrive on time for appointment to keep from having the test rescheduled.  If you need to cancel or reschedule your appointment, please call the office within 24 hours of your appointment. . Patient verbalized understanding.Carrell Palmatier, Ranae Palms

## 2018-10-10 ENCOUNTER — Encounter (HOSPITAL_COMMUNITY): Payer: Self-pay | Admitting: *Deleted

## 2018-10-10 ENCOUNTER — Ambulatory Visit (HOSPITAL_COMMUNITY): Payer: Medicare Other | Attending: Cardiology

## 2018-10-10 ENCOUNTER — Other Ambulatory Visit: Payer: Self-pay

## 2018-10-10 ENCOUNTER — Ambulatory Visit (HOSPITAL_BASED_OUTPATIENT_CLINIC_OR_DEPARTMENT_OTHER): Payer: Medicare Other

## 2018-10-10 VITALS — Ht 65.0 in | Wt 170.0 lb

## 2018-10-10 DIAGNOSIS — I2510 Atherosclerotic heart disease of native coronary artery without angina pectoris: Secondary | ICD-10-CM | POA: Insufficient documentation

## 2018-10-10 DIAGNOSIS — I255 Ischemic cardiomyopathy: Secondary | ICD-10-CM | POA: Diagnosis not present

## 2018-10-10 DIAGNOSIS — R0609 Other forms of dyspnea: Secondary | ICD-10-CM

## 2018-10-10 DIAGNOSIS — Z951 Presence of aortocoronary bypass graft: Secondary | ICD-10-CM | POA: Diagnosis not present

## 2018-10-10 LAB — MYOCARDIAL PERFUSION IMAGING
CHL CUP RESTING HR STRESS: 57 {beats}/min
CSEPPHR: 61 {beats}/min
LV dias vol: 231 mL (ref 62–150)
LV sys vol: 151 mL
SDS: 0
SRS: 4
SSS: 4
TID: 1

## 2018-10-10 MED ORDER — TECHNETIUM TC 99M TETROFOSMIN IV KIT
32.3000 | PACK | Freq: Once | INTRAVENOUS | Status: AC | PRN
  Administered 2018-10-10: 32.3 via INTRAVENOUS
  Filled 2018-10-10: qty 33

## 2018-10-10 MED ORDER — ADENOSINE (DIAGNOSTIC) 3 MG/ML IV SOLN
0.5600 mg/kg | Freq: Once | INTRAVENOUS | Status: AC
  Administered 2018-10-10: 43.2 mg via INTRAVENOUS

## 2018-10-10 MED ORDER — TECHNETIUM TC 99M TETROFOSMIN IV KIT
10.8000 | PACK | Freq: Once | INTRAVENOUS | Status: AC | PRN
  Administered 2018-10-10: 10.8 via INTRAVENOUS
  Filled 2018-10-10: qty 11

## 2018-10-10 NOTE — Progress Notes (Signed)
Patient ID: Francisco [name]., male   DOB: 16-Nov-1946, 72 y.o.   MRN: [id number] Dr. Claiborne Billings consulted in reference to question of whether patient was allergic to Regadenoson. It is listed as an allergy. Patient had this test in '13 and LOC x 3sec and abdominal pain. Lexiscan ordered by Dr. Claiborne Billings originally. Patient had no been reached prior to appointment to clarify. Dr. Claiborne Billings was informed to this by phone by Otho Perl, imaging supervisor. Dr. Claiborne Billings gave the order for [facility]enos[facility] for medication for stress test.Haylynn Pha, Ranae Palms

## 2018-10-31 ENCOUNTER — Ambulatory Visit: Payer: Medicare Other | Admitting: Physician Assistant

## 2018-11-02 ENCOUNTER — Ambulatory Visit: Payer: Medicare Other | Admitting: Physician Assistant

## 2018-11-02 ENCOUNTER — Encounter: Payer: Self-pay | Admitting: Physician Assistant

## 2018-11-02 VITALS — BP 154/92 | HR 61 | Ht 65.0 in | Wt 170.0 lb

## 2018-11-02 DIAGNOSIS — I340 Nonrheumatic mitral (valve) insufficiency: Secondary | ICD-10-CM

## 2018-11-02 DIAGNOSIS — E785 Hyperlipidemia, unspecified: Secondary | ICD-10-CM

## 2018-11-02 DIAGNOSIS — N184 Chronic kidney disease, stage 4 (severe): Secondary | ICD-10-CM

## 2018-11-02 DIAGNOSIS — I2510 Atherosclerotic heart disease of native coronary artery without angina pectoris: Secondary | ICD-10-CM

## 2018-11-02 DIAGNOSIS — I255 Ischemic cardiomyopathy: Secondary | ICD-10-CM

## 2018-11-02 DIAGNOSIS — I38 Endocarditis, valve unspecified: Secondary | ICD-10-CM

## 2018-11-02 DIAGNOSIS — I5023 Acute on chronic systolic (congestive) heart failure: Secondary | ICD-10-CM

## 2018-11-02 MED ORDER — FUROSEMIDE 40 MG PO TABS: ORAL_TABLET | ORAL | 3 refills | Status: DC

## 2018-11-02 NOTE — Patient Instructions (Addendum)
Medication Instructions:  Increase lasix to 60 mg daily If you need a refill on your cardiac medications before your next appointment, please call your pharmacy.   Lab work: BMET, CBC on Monday December 16th. If you have labs (blood work) drawn today and your tests are completely normal, you will receive your results only by: Marland Kitchen MyChart Message (if you have MyChart) OR . A paper copy in the mail If you have any lab test that is abnormal or we need to change your treatment, we will call you to review the results.  Testing/Procedures: Your physician has requested that you have a TEE. During a TEE, sound waves are used to create images of your heart. It provides your doctor with information about the size and shape of your heart and how well your heart's chambers and valves are working. In this test, a transducer is attached to the end of a flexible tube that's guided down your throat and into your esophagus (the tube leading from you mouth to your stomach) to get a more detailed image of your heart. You are not awake for the procedure. Please see the instruction sheet given to you today. For further information please visit [email or web address].   Follow-Up: At [facility], you and your health needs are our priority.  As part of our continuing mission to provide you with exceptional heart care, we have created designated Provider Care Teams.  These Care Teams include your primary Cardiologist (physician) and Advanced Practice Providers (APPs -  Physician Assistants and Nurse Practitioners) who all work together to provide you with the care you need, when you need it. You will need a follow up appointment in 2 months. You may see Dr.Kelly or one of the following Advanced Practice Providers on your designated Care Team: Almyra Deforest, [place] . Fabian Sharp, PA-C  Any Other Special Instructions Will Be Listed Below (If Applicable). Case ID- [id number]      You are scheduled for a TEE/ on December 17th  with Dr. Margaretann Loveless.  Please arrive at the [facility] (Main Entrance A) at [facility]: [address] [place], [place] [address] at 9:00 am. (1 hour prior to procedure unless lab work is needed; if lab work is needed arrive 1.5 hours ahead)  DIET: Nothing to eat or drink after midnight except a sip of water with medications (see medication instructions below)  Medication Instructions: Hold Lasix the morning on.  Continue your anticoagulant: Aspirin You will need to continue your anticoagulant after your procedure until you  are told by your  Provider that it is safe to stop   Labs: CBC, BMET on December 16th, 2019  You must have a responsible person to drive you home and stay in the waiting area during your procedure. Failure to do so could result in cancellation.  Bring your insurance cards.  *Special Note: Every effort is made to have your procedure done on time. Occasionally there are emergencies that occur at the hospital that may cause delays. Please be patient if a delay does occur.

## 2018-11-02 NOTE — H&P (View-Only) (Signed)
Cardiology Office Note    Date:  11/04/2018   ID:  Francisco [name]., DOB 1946-06-21, MRN [id number]  PCP:  Dione Housekeeper, MD  Cardiologist: Dr. Claiborne Billings  Chief Complaint  Patient presents with  . Follow-up    seen for Dr. Claiborne Billings.     History of Present Illness:  Francisco Tesar. is a 72 y.o. male with past medical history of CAD, hypertension, hyperlipidemia, ischemic cardiomyopathy and CKD.  In December 2012, he had NSTEMI he underwent CABG x5 by Dr. Roxan Hockey with LIMA to LAD, SVG to diagonal, sequential SVG to ramus intermedius and OM1 vessel, and SVG to PDA.  EF was 25 to 30% time.  He wore LifeVest for primary prevention.  Subsequent echocardiogram in April 2013 showed EF of 35 to 40%, LifeVest was discontinued.  He has been followed by Dr. Joelyn Oms for his CKD.  Follow-up echocardiogram in 2016 showed mild improvement in his EF up to 45 to 50%, grade 1 DD, septal wall motion changes compatible with his left bundle branch block, mild to moderate MR, PA peak pressure 33 mmHg.  He was recently seen by Dr. Claiborne Billings on 09/05/2018 for dyspnea on exertion.  Repeat echocardiogram performed on 10/10/2018 showed EF 35 to 40%, grade 2 DD, moderate LVH, paradoxical ventricular septal wall motion, moderate MR, moderate to severe TR and PA peak pressure 55 mmHg.  Dr. Claiborne Billings recommended a transesophageal echocardiogram to further evaluate the mitral regurgitation with consideration of possible mitral clip.  Myoview performed on 10/10/2018 showed EF 35%, medium defect of severe severity present in the basal inferior, mid inferior, apical inferior and apex location consistent with prior infarct without ischemia.  Patient presents today for cardiology office visit.  He denies any chest pain or shortness of breath.  On physical exam, he has at least 1-2+ pitting edema in bilateral lower extremity.  I recommend increasing diuretic.  I also discussed the benefit and risk with the patient regarding transesophageal  echocardiogram to assess the degree of mitral regurgitation.  We plan to schedule the procedure for next Tuesday.   Past Medical History:  Diagnosis Date  .  requiring BiPap 11/21/2011  . Acute pulmonary edema ([place]) 11/21/2011  . Coronary artery disease   . HTN (hypertension) 03/11/12   ECHO-EF-35-45%  . Hyperlipidemia 11/27/11   NUCLEAR STRESS TEST-decreased septal and inferior wall  motion and contractility. Remote apical and inferior wall infarct. EF 24%  . NSTEMI (non-ST elevated myocardial infarction) ([place]) 11/21/2011  . Tachycardia 11/21/2011    Past Surgical History:  Procedure Laterality Date  . CARDIAC CATHETERIZATION  01/13  . CORONARY ARTERY BYPASS GRAFT  12/03/2011   Procedure: CORONARY ARTERY BYPASS GRAFTING (CABG);  Surgeon: Melrose Nakayama, MD;  Location: [facility];  Service: Open Heart Surgery;  Laterality: N/A;  Times 5 on  pump.  using right internal mammary artery and bilateral greater saphenous veins.   Marland Kitchen LEFT HEART CATHETERIZATION WITH CORONARY ANGIOGRAM N/A 11/30/2011   Procedure: LEFT HEART CATHETERIZATION WITH CORONARY ANGIOGRAM;  Surgeon: Pixie Casino, MD;  Location: [facility] CATH LAB;  Service: Cardiovascular;  Laterality: N/A;    Current Medications: Outpatient Medications Prior to Visit  Medication Sig Dispense Refill  . aspirin EC 81 MG tablet Take 1 tablet (81 mg total) by mouth daily. (Patient taking differently: Take 81 mg by mouth every evening. ) 90 tablet 3  . atorvastatin (LIPITOR) 40 MG tablet Take 40 mg by mouth every evening.   12  . carvedilol (  COREG) 25 MG tablet TAKE 1 TABLET (25 MG TOTAL) BY MOUTH 2 (TWO) TIMES DAILY WITH A MEAL. 180 tablet 0  . hydrALAZINE (APRESOLINE) 50 MG tablet TAKE 1.5 TABLETS (75 MG TOTAL) BY MOUTH 3 (THREE) TIMES DAILY. (Patient taking differently: Take 75 mg by mouth 3 (three) times daily. ) 405 tablet 0  . isosorbide mononitrate (IMDUR) 30 MG 24 hr tablet TAKE 1 TABLET BY MOUTH EVERY DAY (Patient taking differently:  Take 30 mg by mouth every evening. ) 30 tablet 3  . losartan (COZAAR) 50 MG tablet TAKE 1 TABLET EVERY DAY (Patient taking differently: Take 50 mg by mouth every evening. ) 30 tablet 7  . furosemide (LASIX) 40 MG tablet TAKE 1 TABLET (40 MG TOTAL) BY MOUTH DAILY. NEED OFFICE VISIT 30 tablet 0   No facility-administered medications prior to visit.      Allergies:   Regadenoson   Social History   Socioeconomic History  . Marital status: Married    Spouse name: Not on file  . Number of children: Not on file  . Years of education: Not on file  . Highest education level: Not on file  Occupational History  . Not on file  Social Needs  . Financial resource strain: Not on file  . Food insecurity:    Worry: Not on file    Inability: Not on file  . Transportation needs:    Medical: Not on file    Non-medical: Not on file  Tobacco Use  . Smoking status: Former Smoker    Types: Cigarettes    Last attempt to quit: 11/23/1958    Years since quitting: 59.9  . Smokeless tobacco: Never Used  Substance and Sexual Activity  . Alcohol use: No  . Drug use: Not on file  . Sexual activity: Not on file  Lifestyle  . Physical activity:    Days per week: Not on file    Minutes per session: Not on file  . Stress: Not on file  Relationships  . Social connections:    Talks on phone: Not on file    Gets together: Not on file    Attends religious service: Not on file    Active member of club or organization: Not on file    Attends meetings of clubs or organizations: Not on file    Relationship status: Not on file  Other Topics Concern  . Not on file  Social History Narrative  . Not on file     Family History:  The patient's family history is not on file.   ROS:   Please see the history of present illness.    ROS All other systems reviewed and are negative.   PHYSICAL EXAM:   VS:  BP (!) 154/92   Pulse 61   Ht 5\' 5"  (1.651 m)   Wt 170 lb (77.1 kg)   SpO2 97%   BMI 28.29 kg/m      GEN: Well nourished, well developed, in no acute distress  HEENT: normal  Neck: no JVD, carotid bruits, or masses Cardiac: RRR; no murmurs, rubs, or gallops,no edema  Respiratory:  clear to auscultation bilaterally, normal work of breathing GI: soft, nontender, nondistended, + BS MS: no deformity or atrophy  Skin: warm and dry, no rash Neuro:  Alert and Oriented x 3, Strength and sensation are intact Psych: euthymic mood, full affect  Wt Readings from Last 3 Encounters:  11/02/18 170 lb (77.1 kg)  10/10/18 170 lb (77.1 kg)  09/05/18 170 lb 6.4 oz (77.3 kg)      Studies/Labs Reviewed:   EKG:  EKG is not ordered today.   Recent Labs: 09/05/2018: ALT 14; BUN 38; Creatinine, Ser 2.09; Hemoglobin 10.6; Platelets 190; Potassium 5.2; Sodium 142; TSH 1.720   Lipid Panel    Component Value Date/Time   CHOL 144 09/05/2018 1128   TRIG 36 09/05/2018 1128   HDL 71 09/05/2018 1128   CHOLHDL 2.0 09/05/2018 1128   CHOLHDL 3.8 02/03/2016 0924   VLDL 28 02/03/2016 0924   LDLCALC 66 09/05/2018 1128    Additional studies/ records that were reviewed today include:   Echo 10/10/2018 LV EF: 35% -   40% Study Conclusions  - Left ventricle: The cavity size was mildly dilated. There was   moderate concentric hypertrophy. Systolic function was moderately   reduced. The estimated ejection fraction was in the range of 35%   to 40%. Diffuse hypokinesis. Features are consistent with a   pseudonormal left ventricular filling pattern, with concomitant   abnormal relaxation and increased filling pressure (grade 2   diastolic dysfunction). - Ventricular septum: Septal motion showed paradox. - Mitral valve: There was moderate regurgitation. - Left atrium: The atrium was mildly dilated. - Right ventricle: The cavity size was moderately dilated. Wall   thickness was normal. - Tricuspid valve: There was moderate-severe regurgitation. - Pulmonary arteries: Systolic pressure was moderately  increased.   PA peak pressure: 55 mm Hg (S).  Impressions:  - Since the prior study on 12/19/2014 LVEF has decreased from 45-50%   to 35-40%. Mitral regurgitation is at least moderate (functional,   secondary MR) and tricuspid regurgitation is moderate to severe.   Consider TEE for further evaluation of mitral regurgitation   severity and consideration of a mitraclip.    Myoview 10/10/2018 Study Highlights    The left ventricular ejection fraction is moderately decreased (30-44%) and LV severly dilated.  Nuclear stress EF: 35%.  There was no ST segment deviation noted during stress.  There is a medium defect of severe severity present in the basal inferior, mid inferior, apical inferior and apex location. The defect is non-reversible and consistent with prior infarct. There is no ischemia.  This is an intermediate risk study.       ASSESSMENT:    1. Acute on chronic systolic heart failure due to valvular disease ([place])   2. Mitral valve insufficiency, unspecified etiology   3. Coronary artery disease involving native coronary artery of native heart without angina pectoris   4. Hyperlipidemia LDL goal <70   5. Ischemic cardiomyopathy   6. CKD (chronic kidney disease), stage IV (HCC)      PLAN:  In order of problems listed above:  1. Acute on chronic systolic heart failure: Increase Lasix to 60 mg daily.  Basic metabolic panel on Monday.  2. Mitral regurgitation: Recent heart failure 7, and the drop in EF may be related to worsening mitral valve disease.  TTE showed moderate MR, Dr. Claiborne Billings recommended transesophageal echocardiogram to further assess the mitral regurgitation, if severe may need to consider mitral clip.  I have discussed the benefit and risk of transesophageal echocardiogram with the patient, he displayed clear understanding and agreed to proceed.  CBC and basic metabolic panel next Monday  -Risk and the benefit of the procedure including bradycardia,  hypotension, aspiration event, esophageal injury including hematoma, bleeding or perforation has been discussed with the patient.  Overall risk 1 in 10,000 cases.  3. CAD: Denies any  chest pain. ASA and lipitor  4. Ischemic cardiomyopathy: Recent echocardiogram showed mild drop in ejection fraction, some of which may be related to worsening mitral regurgitation  5. CKD stage III: Repeat basic metabolic panel next Monday.  6. Hyperlipidemia: Continue Lipitor 40 mg daily.    Medication Adjustments/Labs and Tests Ordered: Current medicines are reviewed at length with the patient today.  Concerns regarding medicines are outlined above.  Medication changes, Labs and Tests ordered today are listed in the Patient Instructions below. Patient Instructions  Medication Instructions:  Increase lasix to 60 mg daily If you need a refill on your cardiac medications before your next appointment, please call your pharmacy.   Lab work: BMET, CBC on Monday December 16th. If you have labs (blood work) drawn today and your tests are completely normal, you will receive your results only by: Marland Kitchen MyChart Message (if you have MyChart) OR . A paper copy in the mail If you have any lab test that is abnormal or we need to change your treatment, we will call you to review the results.  Testing/Procedures: Your physician has requested that you have a TEE. During a TEE, sound waves are used to create images of your heart. It provides your doctor with information about the size and shape of your heart and how well your heart's chambers and valves are working. In this test, a transducer is attached to the end of a flexible tube that's guided down your throat and into your esophagus (the tube leading from you mouth to your stomach) to get a more detailed image of your heart. You are not awake for the procedure. Please see the instruction sheet given to you today. For further information please visit  [email or web address].   Follow-Up: At [facility], you and your health needs are our priority.  As part of our continuing mission to provide you with exceptional heart care, we have created designated Provider Care Teams.  These Care Teams include your primary Cardiologist (physician) and Advanced Practice Providers (APPs -  Physician Assistants and Nurse Practitioners) who all work together to provide you with the care you need, when you need it. You will need a follow up appointment in 2 months. You may see Dr.Kelly or one of the following Advanced Practice Providers on your designated Care Team: Almyra Deforest, [place] . Fabian Sharp, PA-C  Any Other Special Instructions Will Be Listed Below (If Applicable). Case ID- [id number]      You are scheduled for a TEE/ on December 17th with Dr. Margaretann Loveless.  Please arrive at the [facility] (Main Entrance A) at [facility]: [address] [place], [place] [address] at 9:00 am. (1 hour prior to procedure unless lab work is needed; if lab work is needed arrive 1.5 hours ahead)  DIET: Nothing to eat or drink after midnight except a sip of water with medications (see medication instructions below)  Medication Instructions: Hold Lasix the morning on.  Continue your anticoagulant: Aspirin You will need to continue your anticoagulant after your procedure until you  are told by your  Provider that it is safe to stop   Labs: CBC, BMET on December 16th, 2019  You must have a responsible person to drive you home and stay in the waiting area during your procedure. Failure to do so could result in cancellation.  Bring your insurance cards.  *Special Note: Every effort is made to have your procedure done on time. Occasionally there are emergencies that occur at the hospital  that may cause delays. Please be patient if a delay does occur.       Hilbert Corrigan, [place]  11/04/2018 11:27 PM    [address],  [place], [place]  [address] Phone: [phone]; Fax: [phone]

## 2018-11-02 NOTE — Progress Notes (Signed)
Cardiology Office Note    Date:  11/04/2018   ID:  Francisco Shoulder., DOB 29-Aug-1946, MRN [id number]  PCP:  Dione Housekeeper, MD  Cardiologist: Dr. Claiborne Billings  Chief Complaint  Patient presents with  . Follow-up    seen for Dr. Claiborne Billings.     History of Present Illness:  Francisco Enns. is a 72 y.o. male with past medical history of CAD, hypertension, hyperlipidemia, ischemic cardiomyopathy and CKD.  In December 2012, he had NSTEMI he underwent CABG x5 by Dr. Roxan Hockey with LIMA to LAD, SVG to diagonal, sequential SVG to ramus intermedius and OM1 vessel, and SVG to PDA.  EF was 25 to 30% time.  He wore LifeVest for primary prevention.  Subsequent echocardiogram in April 2013 showed EF of 35 to 40%, LifeVest was discontinued.  He has been followed by Dr. Joelyn Oms for his CKD.  Follow-up echocardiogram in 2016 showed mild improvement in his EF up to 45 to 50%, grade 1 DD, septal wall motion changes compatible with his left bundle branch block, mild to moderate MR, PA peak pressure 33 mmHg.  He was recently seen by Dr. Claiborne Billings on 09/05/2018 for dyspnea on exertion.  Repeat echocardiogram performed on 10/10/2018 showed EF 35 to 40%, grade 2 DD, moderate LVH, paradoxical ventricular septal wall motion, moderate MR, moderate to severe TR and PA peak pressure 55 mmHg.  Dr. Claiborne Billings recommended a transesophageal echocardiogram to further evaluate the mitral regurgitation with consideration of possible mitral clip.  Myoview performed on 10/10/2018 showed EF 35%, medium defect of severe severity present in the basal inferior, mid inferior, apical inferior and apex location consistent with prior infarct without ischemia.  Patient presents today for cardiology office visit.  He denies any chest pain or shortness of breath.  On physical exam, he has at least 1-2+ pitting edema in bilateral lower extremity.  I recommend increasing diuretic.  I also discussed the benefit and risk with the patient regarding transesophageal  echocardiogram to assess the degree of mitral regurgitation.  We plan to schedule the procedure for next Tuesday.   Past Medical History:  Diagnosis Date  .  requiring BiPap 11/21/2011  . Acute pulmonary edema ([facility]) 11/21/2011  . Coronary artery disease   . HTN (hypertension) 03/11/12   ECHO-EF-35-45%  . Hyperlipidemia 11/27/11   NUCLEAR STRESS TEST-decreased septal and inferior wall  motion and contractility. Remote apical and inferior wall infarct. EF 24%  . NSTEMI (non-ST elevated myocardial infarction) ([place]) 11/21/2011  . Tachycardia 11/21/2011    Past Surgical History:  Procedure Laterality Date  . CARDIAC CATHETERIZATION  01/13  . CORONARY ARTERY BYPASS GRAFT  12/03/2011   Procedure: CORONARY ARTERY BYPASS GRAFTING (CABG);  Surgeon: Melrose Nakayama, MD;  Location: [facility];  Service: Open Heart Surgery;  Laterality: N/A;  Times 5 on  pump.  using right internal mammary artery and bilateral greater saphenous veins.   Marland Kitchen LEFT HEART CATHETERIZATION WITH CORONARY ANGIOGRAM N/A 11/30/2011   Procedure: LEFT HEART CATHETERIZATION WITH CORONARY ANGIOGRAM;  Surgeon: Pixie Casino, MD;  Location: [facility] CATH LAB;  Service: Cardiovascular;  Laterality: N/A;    Current Medications: Outpatient Medications Prior to Visit  Medication Sig Dispense Refill  . aspirin EC 81 MG tablet Take 1 tablet (81 mg total) by mouth daily. (Patient taking differently: Take 81 mg by mouth every evening. ) 90 tablet 3  . atorvastatin (LIPITOR) 40 MG tablet Take 40 mg by mouth every evening.   12  . carvedilol (  COREG) 25 MG tablet TAKE 1 TABLET (25 MG TOTAL) BY MOUTH 2 (TWO) TIMES DAILY WITH A MEAL. 180 tablet 0  . hydrALAZINE (APRESOLINE) 50 MG tablet TAKE 1.5 TABLETS (75 MG TOTAL) BY MOUTH 3 (THREE) TIMES DAILY. (Patient taking differently: Take 75 mg by mouth 3 (three) times daily. ) 405 tablet 0  . isosorbide mononitrate (IMDUR) 30 MG 24 hr tablet TAKE 1 TABLET BY MOUTH EVERY DAY (Patient taking differently:  Take 30 mg by mouth every evening. ) 30 tablet 3  . losartan (COZAAR) 50 MG tablet TAKE 1 TABLET EVERY DAY (Patient taking differently: Take 50 mg by mouth every evening. ) 30 tablet 7  . furosemide (LASIX) 40 MG tablet TAKE 1 TABLET (40 MG TOTAL) BY MOUTH DAILY. NEED OFFICE VISIT 30 tablet 0   No facility-administered medications prior to visit.      Allergies:   Regadenoson   Social History   Socioeconomic History  . Marital status: Married    Spouse name: Not on file  . Number of children: Not on file  . Years of education: Not on file  . Highest education level: Not on file  Occupational History  . Not on file  Social Needs  . Financial resource strain: Not on file  . Food insecurity:    Worry: Not on file    Inability: Not on file  . Transportation needs:    Medical: Not on file    Non-medical: Not on file  Tobacco Use  . Smoking status: Former Smoker    Types: Cigarettes    Last attempt to quit: 11/23/1958    Years since quitting: 59.9  . Smokeless tobacco: Never Used  Substance and Sexual Activity  . Alcohol use: No  . Drug use: Not on file  . Sexual activity: Not on file  Lifestyle  . Physical activity:    Days per week: Not on file    Minutes per session: Not on file  . Stress: Not on file  Relationships  . Social connections:    Talks on phone: Not on file    Gets together: Not on file    Attends religious service: Not on file    Active member of club or organization: Not on file    Attends meetings of clubs or organizations: Not on file    Relationship status: Not on file  Other Topics Concern  . Not on file  Social History Narrative  . Not on file     Family History:  The patient's family history is not on file.   ROS:   Please see the history of present illness.    ROS All other systems reviewed and are negative.   PHYSICAL EXAM:   VS:  BP (!) 154/92   Pulse 61   Ht 5\' 5"  (1.651 m)   Wt 170 lb (77.1 kg)   SpO2 97%   BMI 28.29 kg/m      GEN: Well nourished, well developed, in no acute distress  HEENT: normal  Neck: no JVD, carotid bruits, or masses Cardiac: RRR; no murmurs, rubs, or gallops,no edema  Respiratory:  clear to auscultation bilaterally, normal work of breathing GI: soft, nontender, nondistended, + BS MS: no deformity or atrophy  Skin: warm and dry, no rash Neuro:  Alert and Oriented x 3, Strength and sensation are intact Psych: euthymic mood, full affect  Wt Readings from Last 3 Encounters:  11/02/18 170 lb (77.1 kg)  10/10/18 170 lb (77.1 kg)  09/05/18 170 lb 6.4 oz (77.3 kg)      Studies/Labs Reviewed:   EKG:  EKG is not ordered today.   Recent Labs: 09/05/2018: ALT 14; BUN 38; Creatinine, Ser 2.09; Hemoglobin 10.6; Platelets 190; Potassium 5.2; Sodium 142; TSH 1.720   Lipid Panel    Component Value Date/Time   CHOL 144 09/05/2018 1128   TRIG 36 09/05/2018 1128   HDL 71 09/05/2018 1128   CHOLHDL 2.0 09/05/2018 1128   CHOLHDL 3.8 02/03/2016 0924   VLDL 28 02/03/2016 0924   LDLCALC 66 09/05/2018 1128    Additional studies/ records that were reviewed today include:   Echo 10/10/2018 LV EF: 35% -   40% Study Conclusions  - Left ventricle: The cavity size was mildly dilated. There was   moderate concentric hypertrophy. Systolic function was moderately   reduced. The estimated ejection fraction was in the range of 35%   to 40%. Diffuse hypokinesis. Features are consistent with a   pseudonormal left ventricular filling pattern, with concomitant   abnormal relaxation and increased filling pressure (grade 2   diastolic dysfunction). - Ventricular septum: Septal motion showed paradox. - Mitral valve: There was moderate regurgitation. - Left atrium: The atrium was mildly dilated. - Right ventricle: The cavity size was moderately dilated. Wall   thickness was normal. - Tricuspid valve: There was moderate-severe regurgitation. - Pulmonary arteries: Systolic pressure was moderately  increased.   PA peak pressure: 55 mm Hg (S).  Impressions:  - Since the prior study on 12/19/2014 LVEF has decreased from 45-50%   to 35-40%. Mitral regurgitation is at least moderate (functional,   secondary MR) and tricuspid regurgitation is moderate to severe.   Consider TEE for further evaluation of mitral regurgitation   severity and consideration of a mitraclip.    Myoview 10/10/2018 Study Highlights    The left ventricular ejection fraction is moderately decreased (30-44%) and LV severly dilated.  Nuclear stress EF: 35%.  There was no ST segment deviation noted during stress.  There is a medium defect of severe severity present in the basal inferior, mid inferior, apical inferior and apex location. The defect is non-reversible and consistent with prior infarct. There is no ischemia.  This is an intermediate risk study.       ASSESSMENT:    1. Acute on chronic systolic heart failure due to valvular disease ([place])   2. Mitral valve insufficiency, unspecified etiology   3. Coronary artery disease involving native coronary artery of native heart without angina pectoris   4. Hyperlipidemia LDL goal <70   5. Ischemic cardiomyopathy   6. CKD (chronic kidney disease), stage IV (HCC)      PLAN:  In order of problems listed above:  1. Acute on chronic systolic heart failure: Increase Lasix to 60 mg daily.  Basic metabolic panel on Monday.  2. Mitral regurgitation: Recent heart failure 7, and the drop in EF may be related to worsening mitral valve disease.  TTE showed moderate MR, Dr. Claiborne Billings recommended transesophageal echocardiogram to further assess the mitral regurgitation, if severe may need to consider mitral clip.  I have discussed the benefit and risk of transesophageal echocardiogram with the patient, he displayed clear understanding and agreed to proceed.  CBC and basic metabolic panel next Monday  -Risk and the benefit of the procedure including bradycardia,  hypotension, aspiration event, esophageal injury including hematoma, bleeding or perforation has been discussed with the patient.  Overall risk 1 in 10,000 cases.  3. CAD: Denies any  chest pain. ASA and lipitor  4. Ischemic cardiomyopathy: Recent echocardiogram showed mild drop in ejection fraction, some of which may be related to worsening mitral regurgitation  5. CKD stage III: Repeat basic metabolic panel next Monday.  6. Hyperlipidemia: Continue Lipitor 40 mg daily.    Medication Adjustments/Labs and Tests Ordered: Current medicines are reviewed at length with the patient today.  Concerns regarding medicines are outlined above.  Medication changes, Labs and Tests ordered today are listed in the Patient Instructions below. Patient Instructions  Medication Instructions:  Increase lasix to 60 mg daily If you need a refill on your cardiac medications before your next appointment, please call your pharmacy.   Lab work: BMET, CBC on Monday December 16th. If you have labs (blood work) drawn today and your tests are completely normal, you will receive your results only by: Marland Kitchen MyChart Message (if you have MyChart) OR . A paper copy in the mail If you have any lab test that is abnormal or we need to change your treatment, we will call you to review the results.  Testing/Procedures: Your physician has requested that you have a TEE. During a TEE, sound waves are used to create images of your heart. It provides your doctor with information about the size and shape of your heart and how well your heart's chambers and valves are working. In this test, a transducer is attached to the end of a flexible tube that's guided down your throat and into your esophagus (the tube leading from you mouth to your stomach) to get a more detailed image of your heart. You are not awake for the procedure. Please see the instruction sheet given to you today. For further information please visit  [email or web address].   Follow-Up: At [facility], you and your health needs are our priority.  As part of our continuing mission to provide you with exceptional heart care, we have created designated Provider Care Teams.  These Care Teams include your primary Cardiologist (physician) and Advanced Practice Providers (APPs -  Physician Assistants and Nurse Practitioners) who all work together to provide you with the care you need, when you need it. You will need a follow up appointment in 2 months. You may see Dr.Kelly or one of the following Advanced Practice Providers on your designated Care Team: Almyra Deforest, [place] . Fabian Sharp, PA-C  Any Other Special Instructions Will Be Listed Below (If Applicable). Case ID- [id number]      You are scheduled for a TEE/ on December 17th with Dr. Margaretann Loveless.  Please arrive at the [facility] (Main Entrance A) at [facility]: [address] [place], [place] [address] at 9:00 am. (1 hour prior to procedure unless lab work is needed; if lab work is needed arrive 1.5 hours ahead)  DIET: Nothing to eat or drink after midnight except a sip of water with medications (see medication instructions below)  Medication Instructions: Hold Lasix the morning on.  Continue your anticoagulant: Aspirin You will need to continue your anticoagulant after your procedure until you  are told by your  Provider that it is safe to stop   Labs: CBC, BMET on December 16th, 2019  You must have a responsible person to drive you home and stay in the waiting area during your procedure. Failure to do so could result in cancellation.  Bring your insurance cards.  *Special Note: Every effort is made to have your procedure done on time. Occasionally there are emergencies that occur at the hospital  that may cause delays. Please be patient if a delay does occur.       Hilbert Corrigan, [place]  11/04/2018 11:27 PM    [address],  [place], [place]  [address] Phone: [phone]; Fax: [phone]

## 2018-11-04 ENCOUNTER — Encounter: Payer: Self-pay | Admitting: Physician Assistant

## 2018-11-06 ENCOUNTER — Other Ambulatory Visit: Payer: Self-pay | Admitting: Physician Assistant

## 2018-11-07 LAB — BASIC METABOLIC PANEL
BUN / CREAT RATIO: 19 (ref 10–24)
BUN: 39 mg/dL — ABNORMAL HIGH (ref 8–27)
CHLORIDE: 110 mmol/L — AB (ref 96–106)
CO2: 18 mmol/L — ABNORMAL LOW (ref 20–29)
Calcium: 9 mg/dL (ref 8.6–10.2)
Creatinine, Ser: 2.05 mg/dL — ABNORMAL HIGH (ref 0.76–1.27)
GFR calc non Af Amer: 31 mL/min/{1.73_m2} — ABNORMAL LOW (ref >59)
GFR, EST AFRICAN AMERICAN: 36 mL/min/{1.73_m2} — AB (ref >59)
GLUCOSE: 89 mg/dL (ref 65–99)
POTASSIUM: 4.6 mmol/L (ref 3.5–5.2)
Sodium: 142 mmol/L (ref 134–144)

## 2018-11-07 LAB — CBC
Hematocrit: 32.5 % — ABNORMAL LOW (ref 37.5–51.0)
Hemoglobin: 10.2 g/dL — ABNORMAL LOW (ref 13.0–17.7)
MCH: 26.6 pg (ref 26.6–33.0)
MCHC: 31.4 g/dL — AB (ref 31.5–35.7)
MCV: 85 fL (ref 79–97)
PLATELETS: 180 10*3/uL (ref 150–450)
RBC: 3.83 x10E6/uL — ABNORMAL LOW (ref 4.14–5.80)
RDW: 14.5 % (ref 12.3–15.4)
WBC: 5.6 10*3/uL (ref 3.4–10.8)

## 2018-11-08 ENCOUNTER — Ambulatory Visit (HOSPITAL_BASED_OUTPATIENT_CLINIC_OR_DEPARTMENT_OTHER)
Admission: RE | Admit: 2018-11-08 | Discharge: 2018-11-08 | Disposition: A | Discharge status: Home / Self Care | Payer: Medicare Other | Source: Ambulatory Visit | Attending: Physician Assistant | Admitting: Physician Assistant

## 2018-11-08 ENCOUNTER — Ambulatory Visit (HOSPITAL_COMMUNITY)
Admission: RE | Admit: 2018-11-08 | Discharge: 2018-11-08 | Disposition: A | Discharge status: Home / Self Care | Payer: Medicare Other | Attending: Internal Medicine | Admitting: Internal Medicine

## 2018-11-08 ENCOUNTER — Encounter (HOSPITAL_COMMUNITY)
Admission: RE | Disposition: A | Discharge status: Home / Self Care | Payer: Self-pay | Source: Home / Self Care | Attending: Internal Medicine

## 2018-11-08 ENCOUNTER — Encounter (HOSPITAL_COMMUNITY): Payer: Self-pay | Admitting: *Deleted

## 2018-11-08 ENCOUNTER — Other Ambulatory Visit: Payer: Self-pay

## 2018-11-08 DIAGNOSIS — E785 Hyperlipidemia, unspecified: Secondary | ICD-10-CM | POA: Diagnosis not present

## 2018-11-08 DIAGNOSIS — I252 Old myocardial infarction: Secondary | ICD-10-CM | POA: Insufficient documentation

## 2018-11-08 DIAGNOSIS — Z951 Presence of aortocoronary bypass graft: Secondary | ICD-10-CM | POA: Diagnosis not present

## 2018-11-08 DIAGNOSIS — I5023 Acute on chronic systolic (congestive) heart failure: Secondary | ICD-10-CM | POA: Diagnosis not present

## 2018-11-08 DIAGNOSIS — I340 Nonrheumatic mitral (valve) insufficiency: Secondary | ICD-10-CM | POA: Diagnosis not present

## 2018-11-08 DIAGNOSIS — I255 Ischemic cardiomyopathy: Secondary | ICD-10-CM | POA: Insufficient documentation

## 2018-11-08 DIAGNOSIS — Z7982 Long term (current) use of aspirin: Secondary | ICD-10-CM | POA: Insufficient documentation

## 2018-11-08 DIAGNOSIS — I2510 Atherosclerotic heart disease of native coronary artery without angina pectoris: Secondary | ICD-10-CM | POA: Insufficient documentation

## 2018-11-08 DIAGNOSIS — Z87891 Personal history of nicotine dependence: Secondary | ICD-10-CM | POA: Diagnosis not present

## 2018-11-08 DIAGNOSIS — I081 Rheumatic disorders of both mitral and tricuspid valves: Secondary | ICD-10-CM | POA: Diagnosis not present

## 2018-11-08 DIAGNOSIS — N184 Chronic kidney disease, stage 4 (severe): Secondary | ICD-10-CM | POA: Insufficient documentation

## 2018-11-08 DIAGNOSIS — I130 Hypertensive heart and chronic kidney disease with heart failure and stage 1 through stage 4 chronic kidney disease, or unspecified chronic kidney disease: Secondary | ICD-10-CM | POA: Insufficient documentation

## 2018-11-08 HISTORY — PX: TEE WITHOUT CARDIOVERSION: SHX5443

## 2018-11-08 SURGERY — ECHOCARDIOGRAM, TRANSESOPHAGEAL
Anesthesia: Moderate Sedation

## 2018-11-08 MED ORDER — MIDAZOLAM HCL (PF) 5 MG/ML IJ SOLN
INTRAMUSCULAR | Status: AC
  Filled 2018-11-08: qty 2

## 2018-11-08 MED ORDER — SODIUM CHLORIDE 0.9 % IV SOLN
INTRAVENOUS | Status: DC
  Administered 2018-11-08: 500 mL via INTRAVENOUS

## 2018-11-08 MED ORDER — MIDAZOLAM HCL (PF) 10 MG/2ML IJ SOLN
INTRAMUSCULAR | Status: DC | PRN
  Administered 2018-11-08 (×2): 1 mg via INTRAVENOUS
  Administered 2018-11-08: 2 mg via INTRAVENOUS
  Administered 2018-11-08: 1 mg via INTRAVENOUS

## 2018-11-08 MED ORDER — BUTAMBEN-TETRACAINE-BENZOCAINE 2-2-14 % EX AERO
INHALATION_SPRAY | CUTANEOUS | Status: DC | PRN
  Administered 2018-11-08: 2 via TOPICAL

## 2018-11-08 MED ORDER — FENTANYL CITRATE (PF) 100 MCG/2ML IJ SOLN
INTRAMUSCULAR | Status: AC
  Filled 2018-11-08: qty 2

## 2018-11-08 MED ORDER — FENTANYL CITRATE (PF) 100 MCG/2ML IJ SOLN
INTRAMUSCULAR | Status: DC | PRN
  Administered 2018-11-08: 12.5 ug via INTRAVENOUS
  Administered 2018-11-08: 50 ug via INTRAVENOUS
  Administered 2018-11-08: 12.5 ug via INTRAVENOUS

## 2018-11-08 NOTE — CV Procedure (Signed)
INDICATIONS: mitral valve regurgitation  PROCEDURE:   Informed consent was obtained prior to the procedure. The risks, benefits and alternatives for the procedure were discussed and the patient comprehended these risks.  Risks include, but are not limited to, cough, sore throat, vomiting, nausea, somnolence, esophageal and stomach trauma or perforation, bleeding, low blood pressure, aspiration, pneumonia, infection, trauma to the teeth and death.    After a procedural time-out, the oropharynx was anesthetized with 20% benzocaine spray.   During this procedure the patient was administered a total of Versed 5 mg and Fentanyl 75 mg to achieve and maintain moderate conscious sedation.  The patient's heart rate, blood pressure, and oxygen saturationweare monitored continuously during the procedure. The period of conscious sedation was 30 minutes, of which I was present face-to-face 100% of this time.  The transesophageal probe was inserted in the esophagus and stomach without difficulty and multiple views were obtained.  The patient was kept under observation until the patient left the procedure room.  The patient left the procedure room in stable condition.   Despite adequate dose of sedation, the patient was unable to be safely sedated further due to apnea. The patient was unable to cooperate with the study, continually reaching for the probe.  Therefore, agitated microbubble saline contrast was not administered.  COMPLICATIONS:    There were no immediate complications.  FINDINGS:  Technically challenging exam due to inability to safely sedate the patient due to apnea, patient uncooperative with exam due to discomfort, and heart position which we were unable to optimize despite multiple position changes. Nondiagnostic images of the mitral valve regurgitation.  On qualitative assessment of the mitral valve, there appears to be leaflet tenting, with a relatively central to anterior jet of mitral  regurgitation.  This is best assessed from the transgastric window, and appears moderate at most.  There is also tricuspid valve regurgitation which appears mild to moderate on today's exam.  Nondiagnostic views of the left ventricle were obtained therefore cannot comment on regional wall motion abnormalities, ejection fraction is reduced, 35 to 40%.  The right ventricle is moderately enlarged with moderately reduced function.  The aortic valve does not appear to have significant pathology.  Pulmonary valve was not able to be visualized.  Findings discussed with Almyra Deforest, PA, who shared these findings with the patient's family.  RECOMMENDATIONS:    Recommend transesophageal echocardiogram with anesthesia support if further images of the mitral valve are required.  Recommend medical management of ischemic cardiomyopathy and moderate mitral valve regurgitation.  Time Spent Directly with the Patient:  60 minutes   [name] 11/08/2018, 10:58 AM

## 2018-11-08 NOTE — Interval H&P Note (Signed)
History and Physical Interval Note:  11/08/2018 10:01 AM  Francisco [name].  has presented today for surgery, with the diagnosis of MITRAL REGURGITATION  The various methods of treatment have been discussed with the patient and family. After consideration of risks, benefits and other options for treatment, the patient has consented to  Procedure(s): TRANSESOPHAGEAL ECHOCARDIOGRAM (TEE) (N/A) as a surgical intervention .  The patient's history has been reviewed, patient examined, no change in status, stable for surgery.  I have reviewed the patient's chart and labs.  Questions were answered to the patient's satisfaction.     [name]

## 2018-11-08 NOTE — Discharge Instructions (Signed)

## 2018-11-08 NOTE — Progress Notes (Signed)
  Echocardiogram Echocardiogram Transesophageal has been performed.  Johny Chess 11/08/2018, 11:06 AM

## 2018-11-09 ENCOUNTER — Encounter (HOSPITAL_COMMUNITY): Payer: Self-pay | Admitting: Internal Medicine

## 2018-12-30 ENCOUNTER — Other Ambulatory Visit: Payer: Self-pay | Admitting: Cardiovascular Disease

## 2019-01-09 ENCOUNTER — Encounter: Payer: Self-pay | Admitting: Physician Assistant

## 2019-01-09 ENCOUNTER — Ambulatory Visit: Payer: Medicare Other | Admitting: Physician Assistant

## 2019-01-09 VITALS — BP 188/106 | HR 59 | Ht 65.0 in | Wt 164.0 lb

## 2019-01-09 DIAGNOSIS — I340 Nonrheumatic mitral (valve) insufficiency: Secondary | ICD-10-CM

## 2019-01-09 DIAGNOSIS — I10 Essential (primary) hypertension: Secondary | ICD-10-CM

## 2019-01-09 DIAGNOSIS — I25810 Atherosclerosis of coronary artery bypass graft(s) without angina pectoris: Secondary | ICD-10-CM | POA: Diagnosis not present

## 2019-01-09 DIAGNOSIS — E785 Hyperlipidemia, unspecified: Secondary | ICD-10-CM

## 2019-01-09 DIAGNOSIS — N183 Chronic kidney disease, stage 3 unspecified: Secondary | ICD-10-CM

## 2019-01-09 DIAGNOSIS — I255 Ischemic cardiomyopathy: Secondary | ICD-10-CM

## 2019-01-09 NOTE — Progress Notes (Signed)
Cardiology Office Note    Date:  01/11/2019   ID:  [name], DOB [birth year], MRN [id number]  PCP:  Dione Housekeeper, MD  Cardiologist:  Dr. Claiborne Billings   Chief Complaint  Patient presents with  . Follow-up    seen for Dr. Claiborne Billings    History of Present Illness:  Francisco Coia. is a 73 y.o. male with past medical history of CAD, HTN, HLD, ICM and CKD.  In December 2012, he had NSTEMI he underwent CABG x5 by Dr. Roxan Hockey with LIMA to LAD, SVG to diagonal, sequential SVG to ramus intermedius and OM1 vessel, and SVG to PDA.  EF was 25 to 30% time.  He wore LifeVest for primary prevention.  Subsequent echocardiogram in April 2013 showed EF of 35 to 40%, LifeVest was discontinued.  He has been followed by Dr. Joelyn Oms for his CKD.  Follow-up echocardiogram in 2016 showed mild improvement in his EF up to 45 to 50%, grade 1 DD, septal wall motion changes compatible with his left bundle branch block, mild to moderate MR, PA peak pressure 33 mmHg.  He was recently seen by Dr. Claiborne Billings on 09/05/2018 for dyspnea on exertion.  Repeat echocardiogram performed on 10/10/2018 showed EF 35 to 40%, grade 2 DD, moderate LVH, paradoxical ventricular septal wall motion, moderate MR, moderate to severe TR and PA peak pressure 55 mmHg.  Dr. Claiborne Billings recommended a transesophageal echocardiogram to further evaluate the mitral regurgitation with consideration of possible mitral clip.  Myoview performed on 10/10/2018 showed EF 35%, medium defect of severe severity present in the basal inferior, mid inferior, apical inferior and apex location consistent with prior infarct without ischemia.  I last saw the patient on 11/02/2018, I set the patient up for a transesophageal echocardiogram.  However, the procedure was very challenging as patient was unable to be safely sedated due to apnea and he was uncooperative during the exam due to discomfort.  Mitral valve appears to have leaflet tenting with relatively central to anterior jet of  mitral regurgitation, this appears to be moderate at most when assessed from transgastric window.  There was also tricuspid valve regurgitation which appears to be mild mild to moderate on the exam.  The case was discussed with Dr. Claiborne Billings who was in the hospital on that day and it was recommended to continue medical management.  In the future if the patient does require repeat transesophageal echocardiogram, it is recommended to do so with anesthesia support.  Patient presents today for 96-month follow-up, he is actually quite active.  He has been able to climb stairs and cleaning the yard without any exertional symptoms.  His blood pressure is high today, however he says he rushed out of the house and did not take any of his blood pressure medication this morning.  He appears to be euvolemic on physical exam.  His last lipid panel obtained in October 2019 showed very well-controlled cholesterol.  His renal function is stable on the most recent lab work as well.  He can follow-up with Dr. Claiborne Billings in 4 to 5 months.    Past Medical History:  Diagnosis Date  .  requiring BiPap 11/21/2011  . Acute pulmonary edema ([place]) 11/21/2011  . Coronary artery disease   . HTN (hypertension) 03/11/12   ECHO-EF-35-45%  . Hyperlipidemia 11/27/11   NUCLEAR STRESS TEST-decreased septal and inferior wall  motion and contractility. Remote apical and inferior wall infarct. EF 24%  . NSTEMI (non-ST elevated myocardial infarction) ([place]) 11/21/2011  .  Tachycardia 11/21/2011    Past Surgical History:  Procedure Laterality Date  . CARDIAC CATHETERIZATION  01/13  . CORONARY ARTERY BYPASS GRAFT  12/03/2011   Procedure: CORONARY ARTERY BYPASS GRAFTING (CABG);  Surgeon: Melrose Nakayama, MD;  Location: [facility];  Service: Open Heart Surgery;  Laterality: N/A;  Times 5 on  pump.  using right internal mammary artery and bilateral greater saphenous veins.   Marland Kitchen LEFT HEART CATHETERIZATION WITH CORONARY ANGIOGRAM N/A 11/30/2011    Procedure: LEFT HEART CATHETERIZATION WITH CORONARY ANGIOGRAM;  Surgeon: Pixie Casino, MD;  Location: [facility] CATH LAB;  Service: Cardiovascular;  Laterality: N/A;  . TEE WITHOUT CARDIOVERSION N/A 11/08/2018   Procedure: TRANSESOPHAGEAL ECHOCARDIOGRAM (TEE);  Surgeon: [name], MD;  Location: [facility];  Service: Cardiology;  Laterality: N/A;    Current Medications: Outpatient Medications Prior to Visit  Medication Sig Dispense Refill  . aspirin EC 81 MG tablet Take 1 tablet (81 mg total) by mouth daily. (Patient taking differently: Take 81 mg by mouth every evening. ) 90 tablet 3  . aspirin-acetaminophen-caffeine (EXCEDRIN MIGRAINE) 250-250-65 MG tablet Take 2 tablets by mouth 2 (two) times daily as needed for headache.    Marland Kitchen atorvastatin (LIPITOR) 40 MG tablet Take 40 mg by mouth every evening.   12  . carvedilol (COREG) 25 MG tablet TAKE 1 TABLET (25 MG TOTAL) BY MOUTH 2 (TWO) TIMES DAILY WITH A MEAL. 180 tablet 0  . furosemide (LASIX) 40 MG tablet TAKE 1.5 TABLETS (60 MG TOTAL) BY MOUTH DAILY. (Patient taking differently: Take 60 mg by mouth daily. ) 30 tablet 3  . hydrALAZINE (APRESOLINE) 50 MG tablet TAKE 1.5 TABLETS (75 MG TOTAL) BY MOUTH 3 (THREE) TIMES DAILY. 405 tablet 0  . isosorbide mononitrate (IMDUR) 30 MG 24 hr tablet TAKE 1 TABLET BY MOUTH EVERY DAY (Patient taking differently: Take 30 mg by mouth every evening. ) 30 tablet 3  . losartan (COZAAR) 50 MG tablet TAKE 1 TABLET EVERY DAY (Patient taking differently: Take 50 mg by mouth every evening. ) 30 tablet 7   No facility-administered medications prior to visit.      Allergies:   Regadenoson   Social History   Socioeconomic History  . Marital status: Married    Spouse name: Not on file  . Number of children: Not on file  . Years of education: Not on file  . Highest education level: Not on file  Occupational History  . Not on file  Social Needs  . Financial resource strain: Not on file  . Food insecurity:      Worry: Not on file    Inability: Not on file  . Transportation needs:    Medical: Not on file    Non-medical: Not on file  Tobacco Use  . Smoking status: Former Smoker    Types: Cigarettes    Last attempt to quit: 11/23/1958    Years since quitting: 60.1  . Smokeless tobacco: Never Used  Substance and Sexual Activity  . Alcohol use: No  . Drug use: Not on file  . Sexual activity: Not on file  Lifestyle  . Physical activity:    Days per week: Not on file    Minutes per session: Not on file  . Stress: Not on file  Relationships  . Social connections:    Talks on phone: Not on file    Gets together: Not on file    Attends religious service: Not on file    Active member of  club or organization: Not on file    Attends meetings of clubs or organizations: Not on file    Relationship status: Not on file  Other Topics Concern  . Not on file  Social History Narrative  . Not on file     Family History:  The patient's family history is not on file.   ROS:   Please see the history of present illness.    ROS All other systems reviewed and are negative.   PHYSICAL EXAM:   VS:  BP (!) 188/106   Pulse (!) 59   Ht 5\' 5"  (1.651 m)   Wt 164 lb (74.4 kg)   SpO2 90%   BMI 27.29 kg/m    GEN: Well nourished, well developed, in no acute distress  HEENT: normal  Neck: no JVD, carotid bruits, or masses Cardiac: RRR; no rubs, or gallops,no edema.  2/6 systolic murmur Respiratory:  clear to auscultation bilaterally, normal work of breathing GI: soft, nontender, nondistended, + BS MS: no deformity or atrophy  Skin: warm and dry, no rash Neuro:  Alert and Oriented x 3, Strength and sensation are intact Psych: euthymic mood, full affect  Wt Readings from Last 3 Encounters:  01/09/19 164 lb (74.4 kg)  11/02/18 170 lb (77.1 kg)  10/10/18 170 lb (77.1 kg)      Studies/Labs Reviewed:   EKG:  EKG is not ordered today.    Recent Labs: 09/05/2018: ALT 14; TSH 1.720 11/07/2018: BUN  39; Creatinine, Ser 2.05; Hemoglobin 10.2; Platelets 180; Potassium 4.6; Sodium 142   Lipid Panel    Component Value Date/Time   CHOL 144 09/05/2018 1128   TRIG 36 09/05/2018 1128   HDL 71 09/05/2018 1128   CHOLHDL 2.0 09/05/2018 1128   CHOLHDL 3.8 02/03/2016 0924   VLDL 28 02/03/2016 0924   LDLCALC 66 09/05/2018 1128    Additional studies/ records that were reviewed today include:   TEE 11/08/2018 LV EF: 35% -   40% Study Conclusions  - Study data: Technically challenging exam due to inability to   safely sedate the patient due to apnea, patient uncooperative   with exam, and heart position which we were unable to optimize   despite multiple position changes. Nondiagnostic images of the   mitral valve regurgitation. - Left ventricle: Systolic function was moderately reduced. The   estimated ejection fraction was in the range of 35% to 40%.   Diffuse hypokinesis. - Mitral valve: Mildly thickened leaflets . There was moderate   regurgitation. - Left atrium: The atrium was mildly dilated. No evidence of   thrombus in the atrial cavity or appendage. - Right ventricle: The cavity size was moderately dilated. Wall   thickness was normal. Systolic function was moderately reduced. - Right atrium: The atrium was mildly dilated. - Tricuspid valve: There was mild-moderate regurgitation.  Impressions:  - Findings discussed with Francisco Deforest, PA who was present at the end   of our exam.  Recommendations:  Consider TEE with anesthesia support if further imaging of the mitral valve is required clinically.    ASSESSMENT:    1. Coronary artery disease involving coronary bypass graft of native heart without angina pectoris   2. Essential hypertension   3. Hyperlipidemia LDL goal <70   4. Ischemic cardiomyopathy   5. CKD (chronic kidney disease), stage III ([place])   6. Nonrheumatic mitral valve regurgitation      PLAN:  In order of problems listed above:  1. CAD s/p CABG:  Denies  any recent chest pain or shortness of breath.  Continue aspirin and Lipitor  2. Hypertension: Blood pressure elevated today, however he did not take any medication prior to arrival  3. Hyperlipidemia: Recent cholesterol panel showed very well-controlled cholesterol  4. Ischemic cardiomyopathy: Most recent EF was 35% to 40% based on TEE.  5. Mitral valve regurgitation: Although previous 2D echo demonstrated possible severe MR, recent TEE showed at most moderate MR.  Patient is asymptomatic at this time, will continue medical management  6. CKD stage III: Followed by Dr. Joelyn Oms of nephrology service.    Medication Adjustments/Labs and Tests Ordered: Current medicines are reviewed at length with the patient today.  Concerns regarding medicines are outlined above.  Medication changes, Labs and Tests ordered today are listed in the Patient Instructions below. Patient Instructions  Medication Instructions:   Your physician recommends that you continue on your current medications as directed. Please refer to the Current Medication list given to you today.  If you need a refill on your cardiac medications before your next appointment, please call your pharmacy.   Lab work:  NONE  If you have labs (blood work) drawn today and your tests are completely normal, you will receive your results only by: Marland Kitchen MyChart Message (if you have MyChart) OR . A paper copy in the mail If you have any lab test that is abnormal or we need to change your treatment, we will call you to review the results.  Testing/Procedures:  NONE  Follow-Up: At [facility], you and your health needs are our priority.  As part of our continuing mission to provide you with exceptional heart care, we have created designated Provider Care Teams.  These Care Teams include your primary Cardiologist (physician) and Advanced Practice Providers (APPs -  Physician Assistants and Nurse Practitioners) who all work together to  provide you with the care you need, when you need it. You will need a follow up appointment in 3-4 months.  Please call our office 2 months in advance to schedule this appointment.  You may see Shelva Majestic, MD or one of the following Advanced Practice Providers on your designated Care Team: [place], [place] . Fabian Sharp, PA-C  Any Other Special Instructions Will Be Listed Below (If Applicable).       Hilbert Corrigan, [place]  01/11/2019 [facility] [place], [place],   [address] Phone: [phone]; Fax: [phone]

## 2019-01-09 NOTE — Patient Instructions (Signed)
Medication Instructions:   Your physician recommends that you continue on your current medications as directed. Please refer to the Current Medication list given to you today.  If you need a refill on your cardiac medications before your next appointment, please call your pharmacy.   Lab work:  NONE  If you have labs (blood work) drawn today and your tests are completely normal, you will receive your results only by: Marland Kitchen MyChart Message (if you have MyChart) OR . A paper copy in the mail If you have any lab test that is abnormal or we need to change your treatment, we will call you to review the results.  Testing/Procedures:  NONE  Follow-Up: At [facility], you and your health needs are our priority.  As part of our continuing mission to provide you with exceptional heart care, we have created designated Provider Care Teams.  These Care Teams include your primary Cardiologist (physician) and Advanced Practice Providers (APPs -  Physician Assistants and Nurse Practitioners) who all work together to provide you with the care you need, when you need it. You will need a follow up appointment in 3-4 months.  Please call our office 2 months in advance to schedule this appointment.  You may see Shelva Majestic, MD or one of the following Advanced Practice Providers on your designated Care Team: [place], [place] . Fabian Sharp, PA-C  Any Other Special Instructions Will Be Listed Below (If Applicable).

## 2019-01-11 ENCOUNTER — Encounter: Payer: Self-pay | Admitting: Physician Assistant

## 2019-04-03 ENCOUNTER — Other Ambulatory Visit: Payer: Self-pay | Admitting: Cardiovascular Disease

## 2019-04-03 NOTE — Telephone Encounter (Signed)
Hydralazine and carvedilol refilled.

## 2019-05-11 ENCOUNTER — Telehealth: Payer: Medicare Other | Admitting: Cardiovascular Disease

## 2019-05-12 ENCOUNTER — Telehealth: Payer: Self-pay | Admitting: Physician Assistant

## 2019-05-15 NOTE — Telephone Encounter (Signed)
TELE-VISIT per patient/ home phone/ consent/ declined my chart/ pre reg completed

## 2019-05-16 ENCOUNTER — Other Ambulatory Visit: Payer: Self-pay | Admitting: *Deleted

## 2019-05-16 MED ORDER — FUROSEMIDE 40 MG PO TABS: ORAL_TABLET | ORAL | 2 refills | Status: DC

## 2019-05-17 NOTE — Progress Notes (Signed)
Virtual Visit via Telephone Note   This visit type was conducted due to national recommendations for restrictions regarding the COVID-19 Pandemic (e.g. social distancing) in an effort to limit this patient's exposure and mitigate transmission in our community.  Due to his co-morbid illnesses, this patient is at least at moderate risk for complications without adequate follow up.  This format is felt to be most appropriate for this patient at this time.  The patient did not have access to video technology/had technical difficulties with video requiring transitioning to audio format only (telephone).  All issues noted in this document were discussed and addressed.  No physical exam could be performed with this format.  Please refer to the patient's chart for his  consent to telehealth for [facility].   Date:  05/18/2019   ID:  Francisco Shoulder., DOB 1945-12-29, MRN [id number]  Patient Location: Home Provider Location: Office  PCP:  Dione Housekeeper, MD  Cardiologist:  Shelva Majestic, MD  Electrophysiologist:  None   Evaluation Performed:  Follow-Up Visit  Chief Complaint:  CAD, HFrEF  History of Present Illness:    Francisco Brunkhorst. is a 73 y.o. male with a history of alternating LBBB and RBBB, CAD with severe 3 vessel disease by heart cath in 2013 s/p CABG x 5 (LIMA-LAD, Seq SVG-[place]-OM, SVG-PDA, SVG-diagonal), ischemic cardiomyopathy, HTN, transient hypotension, nonrheumatic mitral valve regurgitation, CKD stage IV, and hyperlipidemia.  Echocardiogram in 02/2012 with EF of 35 to 40%, LifeVest was discontinued.  He was seen in clinic by Dr. Claiborne Billings on 09/05/2018 for dyspnea on exertion.  Repeat echocardiogram on 10/10/2018 with a EF of 35 to 40%, grade 2 DD, moderate LVH, paradoxical ventricular septal wall motion, moderate MR, moderate to severe TR and PA peak pressure 55 mmHg. Dr. Claiborne Billings recommended TEE to evaluate his MR for possible consideration for mitral clip. Nuclear stress test 10/10/18 for  DOE showed findings consistent with scar but no reversible ischemia. TEE was challenging due to inadequate sedation (apnea). Dr. Claiborne Billings reviewed and recommended medical management. He was seen in clinic with Almyra Deforest, [facility], for follow up on 01/09/19.  At that visit it was noted the patient is very active and denied any exertional symptoms. His blood pressure was noted to be high that day, but he rushed out of the house without taking any blood pressure medication.   He presents for a virtual visit today for his 67-month follow-up. He has no cardiac complaints. He exercises from time to time and babysits for his 38 yo granddaughter. No chest pain, SOB, DOE, lower extremity swelling, orthopnea, palpitations, or syncope.    The patient does not have symptoms concerning for COVID-19 infection (fever, chills, cough, or new shortness of breath).    Past Medical History:  Diagnosis Date    requiring BiPap 11/21/2011   Acute pulmonary edema ([place]) 11/21/2011   Coronary artery disease    HTN (hypertension) 03/11/12   ECHO-EF-35-45%   Hyperlipidemia 11/27/11   NUCLEAR STRESS TEST-decreased septal and inferior wall  motion and contractility. Remote apical and inferior wall infarct. EF 24%   NSTEMI (non-ST elevated myocardial infarction) ([place]) 11/21/2011   Tachycardia 11/21/2011   Past Surgical History:  Procedure Laterality Date   CARDIAC CATHETERIZATION  01/13   CORONARY ARTERY BYPASS GRAFT  12/03/2011   Procedure: CORONARY ARTERY BYPASS GRAFTING (CABG);  Surgeon: Melrose Nakayama, MD;  Location: [facility];  Service: Open Heart Surgery;  Laterality: N/A;  Times 5 on  pump.  using right internal mammary artery and bilateral greater saphenous veins.    LEFT HEART CATHETERIZATION WITH CORONARY ANGIOGRAM N/A 11/30/2011   Procedure: LEFT HEART CATHETERIZATION WITH CORONARY ANGIOGRAM;  Surgeon: Pixie Casino, MD;  Location[facility] C[facility] LAB;  Service: Cardiovascular;  Laterality: N/A;   TEE WITHOUT  CARDIOVERSION N/A 11/08/2018   Procedure: TRANSESOPHAGEAL ECHOCARDIOGRAM (TEE);  Surgeon: [name], MD;  Location: [facility];  Service: Cardiology;  Laterality: N/A;     Current Meds  Medication Sig   aspirin EC 81 MG tablet Take 1 tablet (81 mg total) by mouth daily. (Patient taking differently: Take 81 mg by mouth every evening. )   aspirin-acetaminophen-caffeine (EXCEDRIN MIGRAINE) 250-250-65 MG tablet Take 2 tablets by mouth 2 (two) times daily as needed for headache.   atorvastatin (LIPITOR) 40 MG tablet Take 40 mg by mouth every evening.    carvedilol (COREG) 25 MG tablet TAKE 1 TABLET (25 MG TOTAL) BY MOUTH 2 (TWO) TIMES DAILY WITH A MEAL.   furosemide (LASIX) 40 MG tablet TAKE 1.5 TABLETS (60 MG TOTAL) BY MOUTH DAILY.   hydrALAZINE (APRESOLINE) 50 MG tablet TAKE 1.5 TABLETS (75 MG TOTAL) BY MOUTH 3 (THREE) TIMES DAILY.   isosorbide mononitrate (IMDUR) 30 MG 24 hr tablet TAKE 1 TABLET BY MOUTH EVERY DAY (Patient taking differently: Take 30 mg by mouth every evening. )   losartan (COZAAR) 50 MG tablet TAKE 1 TABLET EVERY DAY (Patient taking differently: Take 50 mg by mouth every evening. )     Allergies:   Regadenoson   Social History   Tobacco Use   Smoking status: Former Smoker    Types: Cigarettes    Quit date: 11/23/1958    Years since quitting: 60.5   Smokeless tobacco: Never Used  Substance Use Topics   Alcohol use: No   Drug use: Not on file     Family Hx: The patient's family history is not on file.  ROS:   Please see the history of present illness.     All other systems reviewed and are negative.   Prior CV studies:   The following studies were reviewed today:  TEE 11/08/18: Study Conclusions - Study data: Technically challenging exam due to inability to   safely sedate the patient due to apnea, patient uncooperative   with exam, and heart position which we were unable to optimize   despite multiple position changes. Nondiagnostic  images of the   mitral valve regurgitation. - Left ventricle: Systolic function was moderately reduced. The   estimated ejection fraction was in the range of 35% to 40%.   Diffuse hypokinesis. - Mitral valve: Mildly thickened leaflets . There was moderate   regurgitation. - Left atrium: The atrium was mildly dilated. No evidence of   thrombus in the atrial cavity or appendage. - Right ventricle: The cavity size was moderately dilated. Wall   thickness was normal. Systolic function was moderately reduced. - Right atrium: The atrium was mildly dilated. - Tricuspid valve: There was mild-moderate regurgitation.  Impressions:  - Findings discussed with Almyra Deforest, PA who was present at the end   of our exam.  Recommendations:  Consider TEE with anesthesia support if further imaging of the mitral valve is required clinically.  Labs/Other Tests and Data Reviewed:    EKG:  An ECG dated 09/05/18 was personally reviewed today and demonstrated:  sinus bradycardia, heart rate 58, LBBB (old)  Recent Labs: 09/05/2018: ALT 14; TSH 1.720 11/07/2018: BUN 39; Creatinine, Ser 2.05; Hemoglobin  10.2; Platelets 180; Potassium 4.6; Sodium 142   Recent Lipid Panel Lab Results  Component Value Date/Time   CHOL 144 09/05/2018 11:28 AM   TRIG 36 09/05/2018 11:28 AM   HDL 71 09/05/2018 11:28 AM   CHOLHDL 2.0 09/05/2018 11:28 AM   CHOLHDL 3.8 02/03/2016 09:24 AM   LDLCALC 66 09/05/2018 11:28 AM    Wt Readings from Last 3 Encounters:  05/18/19 164 lb (74.4 kg)  01/09/19 164 lb (74.4 kg)  11/02/18 170 lb (77.1 kg)     Objective:    Vital Signs:  BP 133/89    Pulse 60    Ht 5\' 5"  (1.651 m)    Wt 164 lb (74.4 kg)    BMI 27.29 kg/m    VITAL SIGNS:  reviewed GEN:  no acute distress RESPIRATORY:  normal respiratory effort, symmetric expansion NEURO:  alert and oriented x 3, no obvious focal deficit PSYCH:  normal affect  ASSESSMENT & PLAN:     Mitral regurgitation Recent TEE showed likely  moderate MR. Pt remains asymptomatic. Will continue medical management.    CAD s/p CABG x 5 On ASA 81 mg, lipitor 40 mg. No anginal symptoms.    Hypertension Pressures controlled at home. 75 mg hydralazine TID, imdur 30 daily, and losartan 50 mg daily. No medication changes.    Hyperlipidemia 09/05/2018: Cholesterol, Total 144; HDL 71; LDL Calculated 66; Triglycerides 36 Continue statin   Ischemic cardiomyopathy Chronic combined systolic and diastolic heart failure EF 35-40% by recent TEE Maintained on 60 mg lasix daily with no potassium supplementation. Euvolemic today. No medication changes. Labs followed by nephrology.   CKD stage III-IV Dr. Joelyn Oms (nephrology) follows.    COVID-19 Education: The signs and symptoms of COVID-19 were discussed with the patient and how to seek care for testing (follow up with PCP or arrange E-visit).  The importance of social distancing was discussed today.  Time:   Today, I have spent 15 minutes with the patient with telehealth technology discussing the above problems.     Medication Adjustments/Labs and Tests Ordered: Current medicines are reviewed at length with the patient today.  Concerns regarding medicines are outlined above.   Tests Ordered: No orders of the defined types were placed in this encounter.   Medication Changes: No orders of the defined types were placed in this encounter.   Follow Up:  Virtual Visit or In Person in 6 month(s)  Signed, Ledora Bottcher, [place]  05/18/2019 8:56 AM    [place]

## 2019-05-17 NOTE — Telephone Encounter (Signed)
I called patient to confirm his appointment on 05-18-19 with Fabian Sharp.

## 2019-05-18 ENCOUNTER — Encounter: Payer: Self-pay | Admitting: Physician Assistant

## 2019-05-18 ENCOUNTER — Telehealth (INDEPENDENT_AMBULATORY_CARE_PROVIDER_SITE_OTHER): Payer: Medicare Other | Admitting: Physician Assistant

## 2019-05-18 ENCOUNTER — Other Ambulatory Visit: Payer: Self-pay

## 2019-05-18 VITALS — BP 133/89 | HR 60 | Ht 65.0 in | Wt 164.0 lb

## 2019-05-18 DIAGNOSIS — E785 Hyperlipidemia, unspecified: Secondary | ICD-10-CM

## 2019-05-18 DIAGNOSIS — I25810 Atherosclerosis of coronary artery bypass graft(s) without angina pectoris: Secondary | ICD-10-CM | POA: Diagnosis not present

## 2019-05-18 DIAGNOSIS — N183 Chronic kidney disease, stage 3 unspecified: Secondary | ICD-10-CM

## 2019-05-18 DIAGNOSIS — I340 Nonrheumatic mitral (valve) insufficiency: Secondary | ICD-10-CM

## 2019-05-18 DIAGNOSIS — I255 Ischemic cardiomyopathy: Secondary | ICD-10-CM | POA: Diagnosis not present

## 2019-05-18 DIAGNOSIS — I5022 Chronic systolic (congestive) heart failure: Secondary | ICD-10-CM | POA: Diagnosis not present

## 2019-05-18 DIAGNOSIS — I447 Left bundle-branch block, unspecified: Secondary | ICD-10-CM

## 2019-05-18 DIAGNOSIS — I10 Essential (primary) hypertension: Secondary | ICD-10-CM

## 2019-05-18 DIAGNOSIS — I38 Endocarditis, valve unspecified: Secondary | ICD-10-CM

## 2019-05-18 NOTE — Patient Instructions (Signed)
Medication Instructions:  Your physician recommends that you continue on your current medications as directed. Please refer to the Current Medication list given to you today.  If you need a refill on your cardiac medications before your next appointment, please call your pharmacy.   Follow-Up: At [facility], you and your health needs are our priority.  As part of our continuing mission to provide you with exceptional heart care, we have created designated Provider Care Teams.  These Care Teams include your primary Cardiologist (physician) and Advanced Practice Providers (APPs -  Physician Assistants and Nurse Practitioners) who all work together to provide you with the care you need, when you need it. You will need a follow up appointment in 6 months.  Please call our office 2 months in advance to schedule this appointment.  You may see Thomas Kelly, MD or one of the following Advanced Practice Providers on your designated Care Team: Hao Meng, PA-C . Angela Duke, PA-C   

## 2019-12-08 ENCOUNTER — Telehealth (INDEPENDENT_AMBULATORY_CARE_PROVIDER_SITE_OTHER): Payer: Medicare Other | Admitting: Physician Assistant

## 2019-12-08 ENCOUNTER — Telehealth: Payer: Self-pay

## 2019-12-08 ENCOUNTER — Encounter: Payer: Self-pay | Admitting: Physician Assistant

## 2019-12-08 VITALS — BP 178/98 | HR 53 | Temp 97.7°F

## 2019-12-08 DIAGNOSIS — R600 Localized edema: Secondary | ICD-10-CM

## 2019-12-08 DIAGNOSIS — N184 Chronic kidney disease, stage 4 (severe): Secondary | ICD-10-CM

## 2019-12-08 DIAGNOSIS — I2510 Atherosclerotic heart disease of native coronary artery without angina pectoris: Secondary | ICD-10-CM | POA: Diagnosis not present

## 2019-12-08 DIAGNOSIS — E785 Hyperlipidemia, unspecified: Secondary | ICD-10-CM | POA: Diagnosis not present

## 2019-12-08 DIAGNOSIS — I255 Ischemic cardiomyopathy: Secondary | ICD-10-CM

## 2019-12-08 DIAGNOSIS — I10 Essential (primary) hypertension: Secondary | ICD-10-CM

## 2019-12-08 MED ORDER — HYDRALAZINE HCL 50 MG PO TABS: ORAL_TABLET | ORAL | 2 refills | Status: AC

## 2019-12-08 NOTE — Telephone Encounter (Signed)

## 2019-12-08 NOTE — Progress Notes (Signed)
Virtual Visit via Telephone Note   This visit type was conducted due to national recommendations for restrictions regarding the COVID-19 Pandemic (e.g. social distancing) in an effort to limit this patient's exposure and mitigate transmission in our community.  Due to his co-morbid illnesses, this patient is at least at moderate risk for complications without adequate follow up.  This format is felt to be most appropriate for this patient at this time.  The patient did not have access to video technology/had technical difficulties with video requiring transitioning to audio format only (telephone).  All issues noted in this document were discussed and addressed.  No physical exam could be performed with this format.  Please refer to the patient's chart for his  consent to telehealth for [facility].   Date:  12/08/2019   ID:  Francisco [name]., DOB 1946/01/04, MRN [id number]  Patient Location: Home Provider Location: Office  PCP:  Dione Housekeeper, MD  Cardiologist:  Shelva Majestic, MD  Electrophysiologist:  None   Evaluation Performed:  Follow-Up Visit  Chief Complaint:  Leg edema  History of Present Illness:    Francisco [name]. is a 74 y.o. male with past medical history of CAD, HTN, HLD, ICM and CKD stage IV. In December 2012, he had NSTEMIhe underwent CABG x5 by Dr. Roxan Hockey with LIMA to LAD, SVG to diagonal, sequential SVG to ramus intermedius and OM1 vessel, and SVG to PDA. EF was 25 to 30% at the time. He woreLifeVest for primary prevention. Subsequent echocardiogram in April 2013 showed EF of 35 to 40%, LifeVest was discontinued. He has been followed by Dr. Joelyn Oms for his CKD. Follow-up echocardiogram in 2016 showed mild improvement in his EF up to 45 to 50%, grade 1 DD, septal wall motion changes compatible with his left bundle branch block, mild to moderate MR, PA peak pressure 33 mmHg. He was seen by Dr. Claiborne Billings on 09/05/2018 for dyspnea on exertion. Repeat echocardiogram  performed on 10/10/2018 showed EF 35 to 40%, grade 2 DD, moderate LVH, paradoxical ventricular septal wall motion, moderate MR, moderate to severe TR and PA peak pressure 55 mmHg. Dr. Claiborne Billings recommended a transesophageal echocardiogram to further evaluate the mitral regurgitation with consideration of possible mitral clip. Myoview performed on 10/10/2018 showed EF 35%, medium defect of severe severity present in the basal inferior, mid inferior, apical inferior and apex location consistent with prior infarct without ischemia.  I saw the patient on 11/02/2018, I set the patient up for a transesophageal echocardiogram.  However, the procedure was very challenging as patient was unable to be safely sedated due to apnea and he was uncooperative during the exam due to discomfort.  Mitral valve appears to have leaflet tenting with relatively central to anterior jet of mitral regurgitation, this appears to be moderate at most when assessed from transgastric window.  There was also tricuspid valve regurgitation which appears to be mild mild to moderate on the exam.  The case was discussed with Dr. Claiborne Billings who was in the hospital on that day and it was recommended to continue medical management.  In the future if the patient does require repeat transesophageal echocardiogram, it is recommended to do so with anesthesia support.  I last saw the patient on 01/09/2019, he was quite active at the time.  I called the patient today for virtual visit.  According to his daughter, he has significant pitting edema in the bilateral lower extremity in the ankle and knee area.  Instead of 60  mg daily of Lasix, he has only been taking 40 mg daily.  I recommended for him to go back to 60 mg daily of Lasix for the time being.  Blood pressure is also quite elevated in the 170s.  It is questionable if he is taking 50 mg 3 times daily versus 75 mg 3 times daily of hydralazine at home.  His daughter suspect he is only taking 50 mg 3 times daily  dosing since it is hard for him to cut the pills.  I recommended continue to keep a blood pressure diary in the next week and if his systolic blood pressure is greater than 140 consistently, he is to increase hydralazine 200 mg 3 times daily.  He should have a sooner follow-up with his nephrologist Dr. Joelyn Oms in the next 3 to 4 weeks to follow-up on the renal function given the adjustment of diuretic.  He should follow up with Dr. Claiborne Billings in the next 3 months.   The patient does not have symptoms concerning for COVID-19 infection (fever, chills, cough, or new shortness of breath).    Past Medical History:  Diagnosis Date  .  requiring BiPap 11/21/2011  . Acute pulmonary edema ([place]) 11/21/2011  . Coronary artery disease   . HTN (hypertension) 03/11/12   ECHO-EF-35-45%  . Hyperlipidemia 11/27/11   NUCLEAR STRESS TEST-decreased septal and inferior wall  motion and contractility. Remote apical and inferior wall infarct. EF 24%  . NSTEMI (non-ST elevated myocardial infarction) ([place]) 11/21/2011  . Tachycardia 11/21/2011   Past Surgical History:  Procedure Laterality Date  . CARDIAC CATHETERIZATION  01/13  . CORONARY ARTERY BYPASS GRAFT  12/03/2011   Procedure: CORONARY ARTERY BYPASS GRAFTING (CABG);  Surgeon: Melrose Nakayama, MD;  Location: [place];  Service: Open Heart Surgery;  Laterality: N/A;  Times 5 on  pump.  using right internal mammary artery and bilateral greater saphenous veins.   Marland Kitchen LEFT HEART CATHETERIZATION WITH CORONARY ANGIOGRAM N/A 11/30/2011   Procedure: LEFT HEART CATHETERIZATION WITH CORONARY ANGIOGRAM;  Surgeon: Pixie Casino, MD;  Location: [facility] C[facility] LAB;  Service: Cardiovascular;  Laterality: N/A;  . TEE WITHOUT CARDIOVERSION N/A 11/08/2018   Procedure: TRANSESOPHAGEAL ECHOCARDIOGRAM (TEE);  Surgeon: [name], MD;  Location: [place];  Service: Cardiology;  Laterality: N/A;     Current Meds  Medication Sig  . aspirin EC 81 MG tablet Take 1 tablet (81 mg  total) by mouth daily. (Patient taking differently: Take 81 mg by mouth every evening. )  . aspirin-acetaminophen-caffeine (EXCEDRIN MIGRAINE) 250-250-65 MG tablet Take 2 tablets by mouth 2 (two) times daily as needed for headache.  Marland Kitchen atorvastatin (LIPITOR) 40 MG tablet Take 40 mg by mouth every evening.   . carvedilol (COREG) 25 MG tablet TAKE 1 TABLET (25 MG TOTAL) BY MOUTH 2 (TWO) TIMES DAILY WITH A MEAL.  . furosemide (LASIX) 40 MG tablet TAKE 1.5 TABLETS (60 MG TOTAL) BY MOUTH DAILY.  . hydrALAZINE (APRESOLINE) 50 MG tablet TAKE 1.5 TABLETS (75 MG TOTAL) BY MOUTH 3 (THREE) TIMES DAILY.  . isosorbide mononitrate (IMDUR) 30 MG 24 hr tablet TAKE 1 TABLET BY MOUTH EVERY DAY (Patient taking differently: Take 30 mg by mouth every evening. )  . losartan (COZAAR) 50 MG tablet TAKE 1 TABLET EVERY DAY (Patient taking differently: Take 50 mg by mouth every evening. )     Allergies:   Regadenoson   Social History   Tobacco Use  . Smoking status: Former Smoker    Types: Cigarettes  Quit date: 11/23/1958    Years since quitting: 61.0  . Smokeless tobacco: Never Used  Substance Use Topics  . Alcohol use: No  . Drug use: Not on file     Family Hx: The patient's family history is not on file.  ROS:   Please see the history of present illness.     All other systems reviewed and are negative.   Prior CV studies:   The following studies were reviewed today:  TEE 11/08/2018 LV EF: 35% -   40%  ------------------------------------------------------------------- Indications:      Mitral regurgitation 424.0.  ------------------------------------------------------------------- Study Conclusions  - Study data: Technically challenging exam due to inability to   safely sedate the patient due to apnea, patient uncooperative   with exam, and heart position which we were unable to optimize   despite multiple position changes. Nondiagnostic images of the   mitral valve regurgitation. -  Left ventricle: Systolic function was moderately reduced. The   estimated ejection fraction was in the range of 35% to 40%.   Diffuse hypokinesis. - Mitral valve: Mildly thickened leaflets . There was moderate   regurgitation. - Left atrium: The atrium was mildly dilated. No evidence of   thrombus in the atrial cavity or appendage. - Right ventricle: The cavity size was moderately dilated. Wall   thickness was normal. Systolic function was moderately reduced. - Right atrium: The atrium was mildly dilated. - Tricuspid valve: There was mild-moderate regurgitation.  Impressions:  - Findings discussed with Almyra Deforest, PA who was present at the end of our exam.  Recommendations:  Consider TEE with anesthesia support if further imaging of the mitral valve is required clinically.   Labs/Other Tests and Data Reviewed:    EKG:  An ECG dated 09/05/2018 was personally reviewed today and demonstrated:  Sinus rhythm with left bundle branch block.  Recent Labs: No results found for requested labs within last 8760 hours.   Recent Lipid Panel Lab Results  Component Value Date/Time   CHOL 144 09/05/2018 11:28 AM   TRIG 36 09/05/2018 11:28 AM   HDL 71 09/05/2018 11:28 AM   CHOLHDL 2.0 09/05/2018 11:28 AM   CHOLHDL 3.8 02/03/2016 09:24 AM   LDLCALC 66 09/05/2018 11:28 AM    Wt Readings from Last 3 Encounters:  05/18/19 164 lb (74.4 kg)  01/09/19 164 lb (74.4 kg)  11/02/18 170 lb (77.1 kg)     Objective:    Vital Signs:  BP (!) 178/98   Pulse (!) 53   Temp 97.7 F (36.5 C) (Oral)    VITAL SIGNS:  reviewed  ASSESSMENT & PLAN:    1. Leg edema: It sounds like the patient is volume overloaded.  I recommended increase Lasix to 60 mg daily from the previous 40 mg daily.  Patient will need close follow-up with nephrology service in the next few weeks for lab work  2. Hypertension: Blood pressure uncontrolled at home.  Recommend increasing hydralazine to 100 mg 3 times daily  3. CAD:  Denies any chest pain.  On aspirin and Lipitor  4. Hyperlipidemia: Continue Lipitor  5. Ischemic cardiomyopathy: Patient appears to be volume overloaded based on the phone call.  I recommended increasing the diuretic to 60 mg daily then follow-up with nephrology service closely.    6. CKD stage IV: will need close follow-up with nephrology service after increasing diuretic.  COVID-19 Education: The signs and symptoms of COVID-19 were discussed with the patient and how to seek care for testing (  follow up with PCP or arrange E-visit).  The importance of social distancing was discussed today.  Time:   Today, I have spent 20 minutes with the patient with telehealth technology discussing the above problems.     Medication Adjustments/Labs and Tests Ordered: Current medicines are reviewed at length with the patient today.  Concerns regarding medicines are outlined above.   Tests Ordered: No orders of the defined types were placed in this encounter.   Medication Changes: No orders of the defined types were placed in this encounter.   Follow Up:  In Person in 2 month(s)  Signed, Almyra Deforest, [place]  12/08/2019 9:40 AM    [place]

## 2019-12-08 NOTE — Patient Instructions (Addendum)
Medication Instructions:   INCREASE Lasix to 60 mg daily  Increase Hydralazine to 100 mg(2 tablets)  3 times a day if your Systolic (top) number of your blood pressure is 140 or greater  *If you need a refill on your cardiac medications before your next appointment, please call your pharmacy*  Lab Work:  NONE ordered at this time of appointment   If you have labs (blood work) drawn today and your tests are completely normal, you will receive your results only by: Marland Kitchen MyChart Message (if you have MyChart) OR . A paper copy in the mail If you have any lab test that is abnormal or we need to change your treatment, we will call you to review the results.  Testing/Procedures:  NONE ordered at this time of appointment   Follow-Up: At [facility], you and your health needs are our priority.  As part of our continuing mission to provide you with exceptional heart care, we have created designated Provider Care Teams.  These Care Teams include your primary Cardiologist (physician) and Advanced Practice Providers (APPs -  Physician Assistants and Nurse Practitioners) who all work together to provide you with the care you need, when you need it.  Your next appointment:   3-4 month(s)  The format for your next appointment:   In Person  Provider:   Shelva Majestic, MD  Other Instructions  Monitor your blood pressure for 1 week daily, if your Systolic (top) number of your blood pressure is 140 or greater increase Hydralazine to 100 mg 3 times a day  Follow up with Dr. Joelyn Oms in 3-4 weeks

## 2020-06-01 ENCOUNTER — Other Ambulatory Visit: Payer: Self-pay | Admitting: Cardiology

## 2020-06-17 ENCOUNTER — Other Ambulatory Visit: Payer: Self-pay | Admitting: Cardiovascular Disease

## 2020-09-16 NOTE — Progress Notes (Signed)
Virtual Visit via Telephone Note   This visit type was conducted due to national recommendations for restrictions regarding the COVID-19 Pandemic (e.g. social distancing) in an effort to limit this patient's exposure and mitigate transmission in our community.  Due to his co-morbid illnesses, this patient is at least at moderate risk for complications without adequate follow up.  This format is felt to be most appropriate for this patient at this time.  The patient did not have access to video technology/had technical difficulties with video requiring transitioning to audio format only (telephone).  All issues noted in this document were discussed and addressed.  No physical exam could be performed with this format.  Please refer to the patient's chart for his  consent to telehealth for [facility].    Date:  09/17/2020   ID:  Francisco Shoulder., DOB 07/13/1946, MRN [id number] The patient was identified using 2 identifiers.  Patient Location: Home Provider Location: Office/Clinic  PCP:  Dione Housekeeper, MD  Cardiologist:  Shelva Majestic, MD  Electrophysiologist:  None   Evaluation Performed:  Follow-Up Visit  Chief Complaint:  HTN  History of Present Illness:    Francisco Stocking. is a 74 y.o. male with a history of alternating LBBB and RBBB, CAD with severe 3 vessel disease by heart cath in 2013 s/p CABG x 5 (LIMA-LAD, Seq SVG-[place]-OM, SVG-PDA, SVG-diagonal), ischemic cardiomyopathy, HTN, transient hypotension, nonrheumatic mitral valve regurgitation, CKD stage IV, and hyperlipidemia.  Echocardiogram in 02/2012 with EF of 35 to 40%, LifeVest was discontinued.  He was seen in clinic by Dr. Claiborne Billings on 09/05/2018 for dyspnea on exertion.  Repeat echocardiogram on 10/10/2018 with a EF of 35 to 40%, grade 2 DD, moderate LVH, paradoxical ventricular septal wall motion, moderate MR, moderate to severe TR and PA peak pressure 55 mmHg. Dr. Claiborne Billings recommended TEE to evaluate his MR for possible  consideration for mitral clip. Nuclear stress test 10/10/18 for DOE showed findings consistent with scar but no reversible ischemia. TEE was challenging due to inadequate sedation (apnea). Dr. Claiborne Billings reviewed and recommended medical management. He was seen in clinic with Almyra Deforest, [facility], for follow up on 01/09/19.  At that visit it was noted the patient is very active and denied any exertional symptoms. His blood pressure was noted to be high that day, but he rushed out of the house without taking any blood pressure medication. I saw him for a 4 month follow up and he was doing well, babysitting his 38 yo granddaughter. He was seen by Almyra Deforest [facility] 12/08/19. He had some pitting edema and reported only taking 40 mg lasix instead of 60 mg. He was instructed to go back to 60 mg lasix. His hydralazine dosing was also unclear at vs 75 mg TID. He was instructed to keep a BP diary and follow up in three months, but his was not completed.   He presents today for follow up. He and his wife report his children take his pressure and they are at work. They do not recall his recent readings but state "its good."  No chest pain, SOB, orthopnea, lower extremity, and syncope. He went to [facility] family medicine due to bilateral knee swelling after babysitting grandchildren. They aspirated fluid from both knees. He is working with home PT to exercise his knees. He confirms that he is taking 75 mg hydralazine TID, coreg 25 mg BID, 30 mg imdur, 50- mg losartan, and 60 mg lasix daily.    The patient  does not have symptoms concerning for COVID-19 infection (fever, chills, cough, or new shortness of breath).     Past Medical History:  Diagnosis Date  .  requiring BiPap 11/21/2011  . Acute pulmonary edema ([place]) 11/21/2011  . Coronary artery disease   . HTN (hypertension) 03/11/12   ECHO-EF-35-45%  . Hyperlipidemia 11/27/11   NUCLEAR STRESS TEST-decreased septal and inferior wall  motion and contractility. Remote apical and inferior  wall infarct. EF 24%  . NSTEMI (non-ST elevated myocardial infarction) ([place]) 11/21/2011  . Tachycardia 11/21/2011   Past Surgical History:  Procedure Laterality Date  . CARDIAC CATHETERIZATION  01/13  . CORONARY ARTERY BYPASS GRAFT  12/03/2011   Procedure: CORONARY ARTERY BYPASS GRAFTING (CABG);  Surgeon: Melrose Nakayama, MD;  Location: [place];  Service: Open Heart Surgery;  Laterality: N/A;  Times 5 on  pump.  using right internal mammary artery and bilateral greater saphenous veins.   Marland Kitchen LEFT HEART CATHETERIZATION WITH CORONARY ANGIOGRAM N/A 11/30/2011   Procedure: LEFT HEART CATHETERIZATION WITH CORONARY ANGIOGRAM;  Surgeon: Pixie Casino, MD;  Location: [facility] CATH LAB;  Service: Cardiovascular;  Laterality: N/A;  . TEE WITHOUT CARDIOVERSION N/A 11/08/2018   Procedure: TRANSESOPHAGEAL ECHOCARDIOGRAM (TEE);  Surgeon: [name], MD;  Location: [place];  Service: Cardiology;  Laterality: N/A;     Current Meds  Medication Sig  . aspirin EC 81 MG tablet Take 1 tablet (81 mg total) by mouth daily. (Patient taking differently: Take 81 mg by mouth every evening. )  . aspirin-acetaminophen-caffeine (EXCEDRIN MIGRAINE) 250-250-65 MG tablet Take 2 tablets by mouth 2 (two) times daily as needed for headache.  Marland Kitchen atorvastatin (LIPITOR) 40 MG tablet Take 40 mg by mouth every evening.   . carvedilol (COREG) 25 MG tablet TAKE 1 TABLET (25 MG TOTAL) BY MOUTH 2 (TWO) TIMES DAILY WITH A MEAL.  . furosemide (LASIX) 40 MG tablet TAKE 1.5 TABLETS (60 MG TOTAL) BY MOUTH DAILY.  . hydrALAZINE (APRESOLINE) 50 MG tablet Take 1.5 tablets by mouth 3 times a daily  . isosorbide mononitrate (IMDUR) 30 MG 24 hr tablet TAKE 1 TABLET BY MOUTH EVERY DAY (Patient taking differently: Take 30 mg by mouth every evening. )  . losartan (COZAAR) 50 MG tablet TAKE 1 TABLET EVERY DAY (Patient taking differently: Take 50 mg by mouth every evening. )     Allergies:   Regadenoson   Social History   Tobacco Use  .  Smoking status: Former Smoker    Types: Cigarettes    Quit date: 11/23/1958    Years since quitting: 61.8  . Smokeless tobacco: Never Used  Substance Use Topics  . Alcohol use: No  . Drug use: Not on file     Family Hx: The patient's family history is not on file.  ROS:   Please see the history of present illness.     All other systems reviewed and are negative.   Prior CV studies:   The following studies were reviewed today:  TEE 11/08/18: Study Conclusions   - Study data: Technically challenging exam due to inability to  safely sedate the patient due to apnea, patient uncooperative  with exam, and heart position which we were unable to optimize  despite multiple position changes. Nondiagnostic images of the  mitral valve regurgitation.  - Left ventricle: Systolic function was moderately reduced. The  estimated ejection fraction was in the range of 35% to 40%.  Diffuse hypokinesis.  - Mitral valve: Mildly thickened leaflets . There was  moderate  regurgitation.  - Left atrium: The atrium was mildly dilated. No evidence of  thrombus in the atrial cavity or appendage.  - Right ventricle: The cavity size was moderately dilated. Wall  thickness was normal. Systolic function was moderately reduced.  - Right atrium: The atrium was mildly dilated.  - Tricuspid valve: There was mild-moderate regurgitation.   Impressions:   - Findings discussed with Almyra Deforest, PA who was present at the end  of our exam.    Nuclear stress test 10/10/18:  The left ventricular ejection fraction is moderately decreased (30-44%) and LV severly dilated.  Nuclear stress EF: 35%.  There was no ST segment deviation noted during stress.  There is a medium defect of severe severity present in the basal inferior, mid inferior, apical inferior and apex location. The defect is non-reversible and consistent with prior infarct. There is no ischemia.  This is an intermediate risk  study.   Labs/Other Tests and Data Reviewed:    EKG:  No ECG reviewed.  Recent Labs: No results found for requested labs within last 8760 hours.   Recent Lipid Panel Lab Results  Component Value Date/Time   CHOL 144 09/05/2018 11:28 AM   TRIG 36 09/05/2018 11:28 AM   HDL 71 09/05/2018 11:28 AM   CHOLHDL 2.0 09/05/2018 11:28 AM   CHOLHDL 3.8 02/03/2016 09:24 AM   LDLCALC 66 09/05/2018 11:28 AM    Wt Readings from Last 3 Encounters:  09/17/20 175 lb (79.4 kg)  05/18/19 164 lb (74.4 kg)  01/09/19 164 lb (74.4 kg)     Risk Assessment/Calculations:      Objective:    Vital Signs:  Ht 5\' 5"  (1.651 m)   Wt 175 lb (79.4 kg)   BMI 29.12 kg/m    VITAL SIGNS:  reviewed GEN:  no acute distress RESPIRATORY:  respirations unlabored NEURO:  alert and oriented x 3, no obvious focal deficit PSYCH:  normal affect  ASSESSMENT & PLAN:     Mitral regurgitation - TEE showed likely moderate MR. Pt remains asymptomatic. Will continue medical management.  - no dyspnea   CAD s/p CABG x 5 - continue ASA - no angina   Hypertension - current medications include: 75 mg hydralazine TID, coreg 25 mg BID, 30 mg imdur, 50 mg losartan, and 60 mg lasix daily   Hyperlipidemia 09/05/2018: Cholesterol, Total 144; HDL 71; LDL Calculated 66; Triglycerides 36 - continue lipitor - followed by PCP   Ischemic cardiomyopathy - EF 35-40% by recent TEE - he is taking 60 mg lasix - he sounds euvolemic   CKD stage III-IV - Dr. Joelyn Oms (nephrology) follows.   Follow up with Dr. Claiborne Billings 6 months.   COVID-19 Education: The signs and symptoms of COVID-19 were discussed with the patient and how to seek care for testing (follow up with PCP or arrange E-visit).  The importance of social distancing was discussed today.  Time:   Today, I have spent 16 minutes with the patient with telehealth technology discussing the above problems.     Medication Adjustments/Labs and Tests Ordered: Current  medicines are reviewed at length with the patient today.  Concerns regarding medicines are outlined above.   Tests Ordered: No orders of the defined types were placed in this encounter.   Medication Changes: No orders of the defined types were placed in this encounter.   Follow Up:  In Person in 6 month(s)  Signed, Francisco [name], [place]  09/17/2020 10:14 AM    [facility]

## 2020-09-17 ENCOUNTER — Telehealth (INDEPENDENT_AMBULATORY_CARE_PROVIDER_SITE_OTHER): Payer: Medicare Other | Admitting: Physician Assistant

## 2020-09-17 ENCOUNTER — Telehealth: Payer: Self-pay

## 2020-09-17 ENCOUNTER — Encounter: Payer: Self-pay | Admitting: Physician Assistant

## 2020-09-17 VITALS — Ht 65.0 in | Wt 175.0 lb

## 2020-09-17 DIAGNOSIS — I10 Essential (primary) hypertension: Secondary | ICD-10-CM

## 2020-09-17 DIAGNOSIS — I340 Nonrheumatic mitral (valve) insufficiency: Secondary | ICD-10-CM

## 2020-09-17 DIAGNOSIS — Z951 Presence of aortocoronary bypass graft: Secondary | ICD-10-CM

## 2020-09-17 DIAGNOSIS — E785 Hyperlipidemia, unspecified: Secondary | ICD-10-CM

## 2020-09-17 DIAGNOSIS — I2510 Atherosclerotic heart disease of native coronary artery without angina pectoris: Secondary | ICD-10-CM | POA: Diagnosis not present

## 2020-09-17 DIAGNOSIS — N184 Chronic kidney disease, stage 4 (severe): Secondary | ICD-10-CM

## 2020-09-17 DIAGNOSIS — I255 Ischemic cardiomyopathy: Secondary | ICD-10-CM

## 2020-09-17 NOTE — Patient Instructions (Signed)
Medication Instructions:  No Changes *If you need a refill on your cardiac medications before your next appointment, please call your pharmacy*   Lab Work: No Labs If you have labs (blood work) drawn today and your tests are completely normal, you will receive your results only by: Marland Kitchen MyChart Message (if you have MyChart) OR . A paper copy in the mail If you have any lab test that is abnormal or we need to change your treatment, we will call you to review the results.   Testing/Procedures: No Testing   Follow-Up: At [facility], you and your health needs are our priority.  As part of our continuing mission to provide you with exceptional heart care, we have created designated Provider Care Teams.  These Care Teams include your primary Cardiologist (physician) and Advanced Practice Providers (APPs -  Physician Assistants and Nurse Practitioners) who all work together to provide you with the care you need, when you need it.    Your next appointment:   6 month(s)  The format for your next appointment:   In Person  Provider:   Shelva Majestic, MD

## 2020-09-17 NOTE — Telephone Encounter (Signed)
[name], Francisco [name] are scheduled for a virtual visit with your provider today.    Just as we do with appointments in the office, we must obtain your consent to participate.  Your consent will be active for this visit and any virtual visit you may have with one of our providers in the next 365 days.    If you have a MyChart account, I can also send a copy of this consent to you electronically.  All virtual visits are billed to your insurance company just like a traditional visit in the office.  As this is a virtual visit, video technology does not allow for your provider to perform a traditional examination.  This may limit your provider's ability to fully assess your condition.  If your provider identifies any concerns that need to be evaluated in person or the need to arrange testing such as labs, EKG, etc, we will make arrangements to do so.    Although advances in technology are sophisticated, we cannot ensure that it will always work on either your end or our end.  If the connection with a video visit is poor, we may have to switch to a telephone visit.  With either a video or telephone visit, we are not always able to ensure that we have a secure connection.   I need to obtain your verbal consent now.   Are you willing to proceed with your visit today?   Francisco Shoulder. has provided verbal consent on 09/17/2020 for a virtual visit (video or telephone).   Monia Pouch, [place] 09/17/2020  9:34 AM

## 2021-01-01 ENCOUNTER — Other Ambulatory Visit: Payer: Self-pay | Admitting: Cardiovascular Disease

## 2022-03-28 ENCOUNTER — Other Ambulatory Visit: Payer: Self-pay | Admitting: Cardiovascular Disease

## 2023-01-24 ENCOUNTER — Other Ambulatory Visit: Payer: Self-pay | Admitting: Cardiovascular Disease

## 2023-01-26 NOTE — Telephone Encounter (Signed)
Pt needs to be seen

## 2023-08-13 ENCOUNTER — Other Ambulatory Visit: Payer: Self-pay | Admitting: Cardiovascular Disease
